# Patient Record
Sex: Female | Born: 1950
Health system: Southern US, Community
[De-identification: ages and names within clinical notes are randomized; demographics above are authoritative.]

## PROBLEM LIST (undated history)

## (undated) DIAGNOSIS — C50919 Malignant neoplasm of unspecified site of unspecified female breast: Principal | ICD-10-CM

## (undated) DIAGNOSIS — C7951 Secondary malignant neoplasm of bone: Principal | ICD-10-CM

## (undated) DIAGNOSIS — M8580 Other specified disorders of bone density and structure, unspecified site: Secondary | ICD-10-CM

## (undated) DIAGNOSIS — Z9221 Personal history of antineoplastic chemotherapy: Secondary | ICD-10-CM

## (undated) DIAGNOSIS — Z8781 Personal history of (healed) traumatic fracture: Secondary | ICD-10-CM

## (undated) DIAGNOSIS — Z9889 Other specified postprocedural states: Secondary | ICD-10-CM

## (undated) HISTORY — DX: Personal history of antineoplastic chemotherapy: Z92.21

## (undated) HISTORY — DX: Secondary malignant neoplasm of bone: C79.51

## (undated) HISTORY — DX: Personal history of (healed) traumatic fracture: Z87.81

## (undated) HISTORY — DX: Other specified disorders of bone density and structure, unspecified site: M85.80

## (undated) HISTORY — DX: Malignant neoplasm of unspecified site of unspecified female breast: C50.919

## (undated) HISTORY — DX: Other specified postprocedural states: Z98.890

---

## 1992-08-19 DIAGNOSIS — Z9889 Other specified postprocedural states: Secondary | ICD-10-CM

## 1992-08-19 HISTORY — DX: Other specified postprocedural states: Z98.890

## 1998-01-01 ENCOUNTER — Other Ambulatory Visit: Admission: RE | Admit: 1998-01-01 | Discharge: 1998-01-01 | Payer: Self-pay | Admitting: *Deleted

## 1998-01-15 ENCOUNTER — Other Ambulatory Visit: Admission: RE | Admit: 1998-01-15 | Discharge: 1998-01-15 | Payer: Self-pay | Admitting: *Deleted

## 1998-08-20 ENCOUNTER — Other Ambulatory Visit: Admission: RE | Admit: 1998-08-20 | Discharge: 1998-08-20 | Payer: Self-pay | Admitting: Radiology

## 1998-08-31 ENCOUNTER — Ambulatory Visit (HOSPITAL_COMMUNITY): Admission: RE | Admit: 1998-08-31 | Discharge: 1998-08-31 | Payer: Self-pay | Admitting: Oncology

## 1998-08-31 ENCOUNTER — Encounter: Payer: Self-pay | Admitting: Oncology

## 1998-09-02 ENCOUNTER — Other Ambulatory Visit: Admission: RE | Admit: 1998-09-02 | Discharge: 1998-09-02 | Payer: Self-pay | Admitting: Radiology

## 1998-09-07 ENCOUNTER — Ambulatory Visit (HOSPITAL_COMMUNITY): Admission: RE | Admit: 1998-09-07 | Discharge: 1998-09-07 | Payer: Self-pay | Admitting: *Deleted

## 1998-10-07 ENCOUNTER — Inpatient Hospital Stay (HOSPITAL_COMMUNITY): Admission: RE | Admit: 1998-10-07 | Discharge: 1998-10-09 | Payer: Self-pay | Admitting: *Deleted

## 1998-10-07 HISTORY — PX: MASTECTOMY: SHX3

## 1998-10-22 ENCOUNTER — Ambulatory Visit (HOSPITAL_COMMUNITY): Admission: RE | Admit: 1998-10-22 | Discharge: 1998-10-22 | Payer: Self-pay | Admitting: Oncology

## 1998-10-22 ENCOUNTER — Encounter: Payer: Self-pay | Admitting: Oncology

## 1998-12-03 ENCOUNTER — Ambulatory Visit (HOSPITAL_COMMUNITY): Admission: RE | Admit: 1998-12-03 | Discharge: 1998-12-03 | Payer: Self-pay | Admitting: Oncology

## 1998-12-03 ENCOUNTER — Encounter: Payer: Self-pay | Admitting: Oncology

## 1999-01-28 ENCOUNTER — Other Ambulatory Visit: Admission: RE | Admit: 1999-01-28 | Discharge: 1999-01-28 | Payer: Self-pay | Admitting: *Deleted

## 1999-05-12 ENCOUNTER — Ambulatory Visit (HOSPITAL_BASED_OUTPATIENT_CLINIC_OR_DEPARTMENT_OTHER): Admission: RE | Admit: 1999-05-12 | Discharge: 1999-05-12 | Payer: Self-pay | Admitting: Plastic Surgery

## 1999-07-16 ENCOUNTER — Ambulatory Visit (HOSPITAL_BASED_OUTPATIENT_CLINIC_OR_DEPARTMENT_OTHER): Admission: RE | Admit: 1999-07-16 | Discharge: 1999-07-16 | Payer: Self-pay | Admitting: Plastic Surgery

## 2000-02-02 ENCOUNTER — Other Ambulatory Visit: Admission: RE | Admit: 2000-02-02 | Discharge: 2000-02-02 | Payer: Self-pay | Admitting: *Deleted

## 2000-08-17 ENCOUNTER — Encounter: Payer: Self-pay | Admitting: Neurosurgery

## 2000-08-17 ENCOUNTER — Ambulatory Visit (HOSPITAL_COMMUNITY): Admission: RE | Admit: 2000-08-17 | Discharge: 2000-08-17 | Payer: Self-pay | Admitting: Neurosurgery

## 2000-08-21 ENCOUNTER — Encounter: Payer: Self-pay | Admitting: Neurological Surgery

## 2000-08-21 ENCOUNTER — Ambulatory Visit (HOSPITAL_COMMUNITY): Admission: RE | Admit: 2000-08-21 | Discharge: 2000-08-22 | Payer: Self-pay | Admitting: Neurological Surgery

## 2001-02-07 ENCOUNTER — Other Ambulatory Visit: Admission: RE | Admit: 2001-02-07 | Discharge: 2001-02-07 | Payer: Self-pay | Admitting: *Deleted

## 2002-02-20 ENCOUNTER — Other Ambulatory Visit: Admission: RE | Admit: 2002-02-20 | Discharge: 2002-02-20 | Payer: Self-pay | Admitting: *Deleted

## 2003-03-06 ENCOUNTER — Other Ambulatory Visit: Admission: RE | Admit: 2003-03-06 | Discharge: 2003-03-06 | Payer: Self-pay | Admitting: *Deleted

## 2004-03-04 ENCOUNTER — Other Ambulatory Visit: Admission: RE | Admit: 2004-03-04 | Discharge: 2004-03-04 | Payer: Self-pay | Admitting: *Deleted

## 2005-01-05 ENCOUNTER — Ambulatory Visit: Payer: Self-pay | Admitting: Oncology

## 2005-03-17 ENCOUNTER — Other Ambulatory Visit: Admission: RE | Admit: 2005-03-17 | Discharge: 2005-03-17 | Payer: Self-pay | Admitting: *Deleted

## 2005-10-05 ENCOUNTER — Ambulatory Visit: Payer: Self-pay | Admitting: Oncology

## 2006-03-02 ENCOUNTER — Other Ambulatory Visit: Admission: RE | Admit: 2006-03-02 | Discharge: 2006-03-02 | Payer: Self-pay | Admitting: *Deleted

## 2006-11-06 ENCOUNTER — Ambulatory Visit: Payer: Self-pay | Admitting: Oncology

## 2007-04-11 ENCOUNTER — Other Ambulatory Visit: Admission: RE | Admit: 2007-04-11 | Discharge: 2007-04-11 | Payer: Self-pay | Admitting: *Deleted

## 2007-11-06 ENCOUNTER — Ambulatory Visit: Payer: Self-pay | Admitting: Oncology

## 2008-01-08 ENCOUNTER — Ambulatory Visit (HOSPITAL_COMMUNITY): Admission: RE | Admit: 2008-01-08 | Discharge: 2008-01-08 | Payer: Self-pay | Admitting: Orthopedic Surgery

## 2008-03-14 ENCOUNTER — Ambulatory Visit (HOSPITAL_COMMUNITY): Admission: RE | Admit: 2008-03-14 | Discharge: 2008-03-14 | Payer: Self-pay | Admitting: Gynecology

## 2008-04-17 ENCOUNTER — Other Ambulatory Visit: Admission: RE | Admit: 2008-04-17 | Discharge: 2008-04-17 | Payer: Self-pay | Admitting: Gynecology

## 2008-11-04 ENCOUNTER — Ambulatory Visit: Payer: Self-pay | Admitting: Oncology

## 2009-11-02 ENCOUNTER — Ambulatory Visit: Payer: Self-pay | Admitting: Oncology

## 2009-11-11 ENCOUNTER — Ambulatory Visit: Payer: Self-pay | Admitting: Genetic Counselor

## 2009-12-28 ENCOUNTER — Ambulatory Visit: Payer: Self-pay | Admitting: Oncology

## 2011-02-01 NOTE — Op Note (Signed)
Stacy Horn, Stacy Horn               ACCOUNT NO.:  [id number]   MEDICAL RECORD NO.:  [id number]          PATIENT TYPE:  AMB   LOCATION:  DAY                          FACILITY:  [facility]   PHYSICIAN:  Marlowe Kays, M.D.  DATE OF BIRTH:  [birth year]   DATE OF PROCEDURE:  01/08/2008  DATE OF DISCHARGE:                               OPERATIVE REPORT   PREOPERATIVE DIAGNOSIS:  Chronic impingement syndrome right shoulder  with rotator cuff tendinopathy.   POSTOPERATIVE DIAGNOSIS:  Chronic impingement syndrome right shoulder  with rotator cuff tendinopathy.   OPERATION:  1. Right shoulder arthroscopy with normal glenohumeral examination.  2. Arthroscopic subacromial decompression.   SURGEON:  Marlowe Kays, M.D.   ASSISTANTDruscilla Brownie. Idolina Primer, P.A.-C.   ANESTHESIA:  General.   INDICATIONS FOR PROCEDURE:  [name] had an MRI performed on November 15, 2007 which demonstrated what appeared to be some fluid in the  subacromial space, a partial tear of the rotator cuff, and a fairly  substantial impingement problem of a type 3 acromion.  She is here,  today, consequently for as a minimum arthroscopic subacromial  decompression with repair of any rotator cuff tear found as necessary.   DESCRIPTION OF PROCEDURE:  Prophylactic antibiotics.  She has a history  of bilateral breast implants, and a staph infection for excision of a  basal cell carcinoma of her right shoulder.  After satisfactory general  anesthesia, placed on the [place] frame.  The right shoulder girdle was  prepped with DuraPrep, and draped in a sterile field.  A time-out  performed.  The anatomy of the shoulder joint was marked out in the  subacromial space, and lateral and posterior portal sites were  infiltrated with 1/2% Marcaine with adrenaline.  Through a posterior  soft spot portal, I atraumatically entered the glenohumeral joint which  was normal in appearance.  Representative pictures were taken.   I then  redirected the scope in the subacromial space, and through a  lateral portal introduced a 4.2 shaver.  The findings in the subacromial  space were significant in that she had almost no bursitis, she did have  a very thick subacromial soft tissue.  The anterior rotator cuff looked  very smooth with no evidence of any tearing.  I then brought in the 90-  degree ArthroCare vaporizer, and began removing soft tissue from the  undersurface of the acromion.  On going back to the [facility] joint the distal  clavicle did not appear to be an impingement factor.   After exposing the subacromial bone, I then brought in the 4 mm oval  bur, and began burring down the acromion.  I worked back-and-forth  between the vaporizer and the bur until we had a wide decompression as  noted.  We inspected the rotator cuff which was smooth.  I took final  pictures demonstrating the release of the impingement,  and then removed all fluid possible from the subacromial space.  I  reinfiltrated the two portals of the subacromial space with 1/2%  Marcaine with adrenaline, and we closed the two portals with 4-0 nylon.  Betadine and Adaptic dry sterile dressing were applied.  She was placed  to shoulder immobilizer.           ______________________________  Marlowe Kays, M.D.     JA/MEDQ  D:  01/08/2008  T:  01/08/2008  Job:  [id number]

## 2011-06-13 ENCOUNTER — Encounter (HOSPITAL_BASED_OUTPATIENT_CLINIC_OR_DEPARTMENT_OTHER): Payer: PRIVATE HEALTH INSURANCE | Admitting: Oncology

## 2011-06-13 DIAGNOSIS — R0789 Other chest pain: Secondary | ICD-10-CM

## 2011-06-13 DIAGNOSIS — C50919 Malignant neoplasm of unspecified site of unspecified female breast: Secondary | ICD-10-CM

## 2011-06-14 LAB — HEMOGLOBIN AND HEMATOCRIT, BLOOD: HCT: 41.2

## 2012-02-10 ENCOUNTER — Telehealth: Payer: Self-pay | Admitting: *Deleted

## 2012-02-10 NOTE — Telephone Encounter (Signed)
Received message from Dr. Anders Grant office reporting pt is being scheduled for biopsy for possible metastases in L suprapubic region. Asking for appt with Dr. Truett Perna pending results. Attempted to call to office for biopsy date. Office was closed. Will follow up next week.

## 2012-02-16 HISTORY — PX: BONE BIOPSY: SHX375

## 2012-02-17 ENCOUNTER — Telehealth: Payer: Self-pay | Admitting: *Deleted

## 2012-02-17 NOTE — Telephone Encounter (Signed)
Attempt to reach [facility] with Dr. Shary Decamp regarding date of biopsy. Office closed for the weekend.

## 2012-02-20 ENCOUNTER — Telehealth: Payer: Self-pay | Admitting: *Deleted

## 2012-02-20 NOTE — Telephone Encounter (Signed)
Was told the biopsy was done on 02/16/12 and path is still pending. Hope to have results in next 48 hours. Per Dr. Marily Memos not make appointment here unless the path report warrants f/u here.

## 2012-02-22 ENCOUNTER — Other Ambulatory Visit: Payer: Self-pay | Admitting: *Deleted

## 2012-02-23 ENCOUNTER — Telehealth: Payer: Self-pay | Admitting: *Deleted

## 2012-02-23 NOTE — Telephone Encounter (Signed)
Requested Her-2 Neu testing on pelvic bone biopsy of 02/16/12 Accession # [id number]. Was told by Dr. Mila Palmer that Her-2 Neu is not validated on bone samples, thus "can't be done".

## 2012-02-27 ENCOUNTER — Other Ambulatory Visit: Payer: PRIVATE HEALTH INSURANCE | Admitting: Lab

## 2012-02-27 ENCOUNTER — Ambulatory Visit (HOSPITAL_BASED_OUTPATIENT_CLINIC_OR_DEPARTMENT_OTHER): Payer: Commercial Managed Care - PPO | Admitting: Oncology

## 2012-02-27 VITALS — BP 100/54 | HR 73 | Temp 98.2°F | Ht 65.5 in | Wt 154.6 lb

## 2012-02-27 DIAGNOSIS — R52 Pain, unspecified: Secondary | ICD-10-CM

## 2012-02-27 DIAGNOSIS — C7951 Secondary malignant neoplasm of bone: Secondary | ICD-10-CM

## 2012-02-27 DIAGNOSIS — C50919 Malignant neoplasm of unspecified site of unspecified female breast: Secondary | ICD-10-CM

## 2012-02-27 DIAGNOSIS — C7952 Secondary malignant neoplasm of bone marrow: Secondary | ICD-10-CM

## 2012-02-27 DIAGNOSIS — Z170 Estrogen receptor positive status [ER+]: Secondary | ICD-10-CM

## 2012-02-27 MED ORDER — OXYCODONE-ACETAMINOPHEN 5-325 MG PO TABS: 1.0000 | ORAL_TABLET | ORAL | Status: DC | PRN

## 2012-02-27 NOTE — Progress Notes (Signed)
[facility]    OFFICE PROGRESS NOTE   INTERVAL HISTORY:   She was last seen at the cancer Center in September of 2012. She presents today for an unscheduled visit. Stacy Horn reports a 5-6 week history of discomfort in the left "groin" region. The discomfort did not improve when she was treated for a urinary tract infection. She saw her orthopedic physician and reports a plain x-ray evaluation of the spine was nondiagnostic.  She returned to Dr. Shary Decamp and a CT of the pelvis on may 21st 2013 was remarkable for a 3.1 x 1.8 x 2.4 cm destructive/lytic lesion in the left pubic symphysis with associated cortical disruption. A cystic lesion in the right ovary was without aggressive imaging features. No suspicious abdominal lymphadenopathy. No ascites.  She was referred for a biopsy of the left pubic lesion in radiology on may 30th 2013. The pathology revealed metastatic carcinoma involving trabecular bone and marrow spaces. A malignant cells were positive for AE1/AE3, cytokeratin 7, and focally with ER. The cells were negative for cytokeratin 20 and PR. The immunostaining pattern and histologic features were felt to be consistent with breast or lung primaries.  She continues to have pain in the left pubic area that is much worse with weightbearing. No other pain.   Review of systems: Pain at the left groin area for the past 5-6 weeks, worse with weightbearing and using the thigh musculature. Chronic numbness in the right leg . Recent episode of urinary frequency improved following treatment with ciprofloxacin  A complete review of systems was otherwise negative Objective:  Vital signs in last 24 hours:  Blood pressure 100/54, pulse 73, temperature 98.2 F (36.8 C), height 5' 5.5" (1.664 m), weight 154 lb 9.6 oz (70.126 kg).    HEENT: Neck without mass Lymphatics: No cervical, supraclavicular, axillary, or inguinal nodes Resp: Lungs clear bilaterally Cardio: Regular rate  and rhythm GI: No hepatomegaly, no mass, nontender Vascular: No leg edema  Musculoskeletal: There is tenderness at the left pubic bone. There is a less than 1 cm seroma/hematoma beneath the left pubic bone biopsy site. No tenderness at the spine. Full range of motion at the left hip without pain. Breast: Status post bilateral mastectomy. No evidence for chest wall tumor recurrence.   Lab Results:  Jan 26 2012 at [facility]-calcium 9.4, creatinine 0.7, alkaline phosphatase 71, albumin 4.7, bilirubin 0.6, AST 17, ALT 16   Medications: I have reviewed the patient's current medications.  Assessment/Plan: 1. Multicentric invasive lobular carcinoma of the right breast diagnosed in December of 1999, a right mastectomy followed by adjuvant [facility] chemotherapy, 5 years of tamoxifen, and 5 years of Femara. The Femara was completed in June of 2010.  2. Pain at the left groin area with a CT of the pelvis on 02/07/12 confirming a destructive lytic lesion at the left pubic symphysis, status post a CT-guided biopsy on 02/16/12 confirming metastatic carcinoma, focally ER positive  3. BRCA1 variant felt to be a polymorphism   Disposition:  She has been diagnosed with metastatic carcinoma involving a left pubis lesion. She appears to have metastatic breast cancer. I discussed the diagnosis, prognosis, and treatment options with Stacy Horn and her husband. She understands patients with metastatic breast cancer involving the "bones "only can live for an extended period of time.  She will complete a staging evaluation prior to making a decision on systemic therapy.  Stacy Horn is symptomatic with pain at the left pubis. We will make  a referral to radiation oncology for palliative radiation. She will try Percocet for the pain.  We will obtain the original ER/PR/HER-2/neu report and we will ask for a HER-2 evaluation on the pubis biopsy.  My initial impression is to recommend systemic hormonal therapy  at the completion of radiation.  Stacy Horn will return for an office visit after the staging PET scan. We will obtain a baseline CA 27.29 and CBC.    Approximately 50 minutes were spent with the patient and her husband today, the majority of which were involved with counseling and coordination of care. I called for on 02/22/2012 to discuss the probable diagnosis of recurrent breast cancer.  Thornton Papas, MD  02/27/2012  5:39 PM

## 2012-02-29 ENCOUNTER — Telehealth: Payer: Self-pay | Admitting: *Deleted

## 2012-02-29 NOTE — Telephone Encounter (Signed)
Message from pt asking to call her at work with appts. [phone] Extension [phone]

## 2012-02-29 NOTE — Telephone Encounter (Signed)
Spoke with pt, MD appt given. She wants to be sure radiation is set up in [place]. Informed her that schedulers will call with PET and radiation appts.

## 2012-03-01 ENCOUNTER — Telehealth: Payer: Self-pay | Admitting: Oncology

## 2012-03-01 NOTE — Telephone Encounter (Signed)
called pts and she is aware of pet scan on 06/19 @ [facility]. aslo informed pt that her information was faxed to [facility] center and they will call her with appt

## 2012-03-06 ENCOUNTER — Other Ambulatory Visit (HOSPITAL_BASED_OUTPATIENT_CLINIC_OR_DEPARTMENT_OTHER): Payer: Commercial Managed Care - PPO | Admitting: Lab

## 2012-03-06 ENCOUNTER — Telehealth: Payer: Self-pay

## 2012-03-06 DIAGNOSIS — C50319 Malignant neoplasm of lower-inner quadrant of unspecified female breast: Secondary | ICD-10-CM

## 2012-03-06 DIAGNOSIS — R52 Pain, unspecified: Secondary | ICD-10-CM

## 2012-03-06 DIAGNOSIS — C50919 Malignant neoplasm of unspecified site of unspecified female breast: Secondary | ICD-10-CM

## 2012-03-06 LAB — CBC WITH DIFFERENTIAL/PLATELET
Basophils Absolute: 0 10*3/uL (ref 0.0–0.1)
Eosinophils Absolute: 0.1 10*3/uL (ref 0.0–0.5)
HCT: 39.2 % (ref 34.8–46.6)
HGB: 13.2 g/dL (ref 11.6–15.9)
LYMPH%: 27.7 % (ref 14.0–49.7)
MCHC: 33.7 g/dL (ref 31.5–36.0)
MONO#: 0.3 10*3/uL (ref 0.1–0.9)
NEUT#: 3.5 10*3/uL (ref 1.5–6.5)
NEUT%: 65.6 % (ref 38.4–76.8)
Platelets: 209 10*3/uL (ref 145–400)
WBC: 5.3 10*3/uL (ref 3.9–10.3)
lymph#: 1.5 10*3/uL (ref 0.9–3.3)

## 2012-03-06 NOTE — Telephone Encounter (Signed)
Received call from pt stating that Dr. Truett Perna informed her that she would go ahead and get started with her radiation at [facility], but she has not heard anything.  She states her sister, who works there, called to check on this appointment, and was informed that they were waiting for PET scan results and CT scan and biopsy results first.  Pt was under the impression that her radiation could be started to help her pain.  Informed pt will check with Dr. Truett Perna, and office will call her back.  Pt verbalizes understanding.

## 2012-03-07 ENCOUNTER — Telehealth: Payer: Self-pay | Admitting: Oncology

## 2012-03-07 ENCOUNTER — Telehealth: Payer: Self-pay

## 2012-03-07 ENCOUNTER — Encounter (HOSPITAL_COMMUNITY)
Admission: RE | Admit: 2012-03-07 | Discharge: 2012-03-07 | Disposition: A | Discharge status: Home / Self Care | Payer: Commercial Managed Care - PPO | Source: Ambulatory Visit | Attending: Oncology | Admitting: Oncology

## 2012-03-07 DIAGNOSIS — C7951 Secondary malignant neoplasm of bone: Secondary | ICD-10-CM | POA: Insufficient documentation

## 2012-03-07 DIAGNOSIS — Z9010 Acquired absence of unspecified breast and nipple: Secondary | ICD-10-CM | POA: Insufficient documentation

## 2012-03-07 DIAGNOSIS — C50919 Malignant neoplasm of unspecified site of unspecified female breast: Secondary | ICD-10-CM | POA: Insufficient documentation

## 2012-03-07 MED ORDER — FLUDEOXYGLUCOSE F - 18 (FDG) INJECTION
15.2000 | Freq: Once | INTRAVENOUS | Status: AC | PRN
  Administered 2012-03-07: 15.2 via INTRAVENOUS

## 2012-03-07 NOTE — Telephone Encounter (Signed)
called pt Stacy Horn for appt with Dr. Karoline Caldwell asked pt to rtn call to confirm appt

## 2012-03-07 NOTE — Telephone Encounter (Signed)
Left a message for Stacy Horn that an appt. was able to be set up tomorrow  At [facility] with Dr. Clovis Riley.  She needs to arrive at 0845. Faxed path and cytology reports form 1999 to 2000 to [facility]. [phone].

## 2012-03-09 ENCOUNTER — Ambulatory Visit (HOSPITAL_BASED_OUTPATIENT_CLINIC_OR_DEPARTMENT_OTHER): Payer: Commercial Managed Care - PPO | Admitting: Oncology

## 2012-03-09 ENCOUNTER — Other Ambulatory Visit: Payer: Self-pay | Admitting: Oncology

## 2012-03-09 ENCOUNTER — Telehealth: Payer: Self-pay | Admitting: Oncology

## 2012-03-09 VITALS — BP 108/66 | HR 64 | Temp 97.5°F | Ht 65.5 in | Wt 152.7 lb

## 2012-03-09 DIAGNOSIS — C50919 Malignant neoplasm of unspecified site of unspecified female breast: Secondary | ICD-10-CM

## 2012-03-09 DIAGNOSIS — G893 Neoplasm related pain (acute) (chronic): Secondary | ICD-10-CM

## 2012-03-09 DIAGNOSIS — C7951 Secondary malignant neoplasm of bone: Secondary | ICD-10-CM

## 2012-03-09 LAB — LACTATE DEHYDROGENASE: LDH: 97 U/L (ref 94–250)

## 2012-03-09 LAB — CEA: CEA: <0.5 ng/mL (ref 0.0–5.0)

## 2012-03-09 NOTE — Telephone Encounter (Signed)
appts made and printed for pt aom °

## 2012-03-09 NOTE — Progress Notes (Signed)
   [facility]    OFFICE PROGRESS NOTE   INTERVAL HISTORY:   She saw Dr. Clovis Riley and he is scheduled to begin radiation on 03/12/2012. She continues to have pain at the left pubic area. The pain is relieved with Percocet. She also has pain at the left iliac with weightbearing. No pain in the upper back. No other complaint.  Objective:  Vital signs in last 24 hours:  Blood pressure 108/66, pulse 64, temperature 97.5 F (36.4 C), temperature source Oral, height 5' 5.5" (1.664 m), weight 152 lb 11.2 oz (69.264 kg).    Musculoskeletal: No tenderness at the left iliac or thoracic spine.  X-rays: PET scan  03/07/2012-multifocal hypermetabolic lytic bone metastases including lesions at the transverse process of T3, the left iliac bone, and the left pubic symphysis. No other evidence of metastatic disease.  Lab Results:  Lab Results  Component Value Date   WBC 5.3 03/06/2012   HGB 13.2 03/06/2012   HCT 39.2 03/06/2012   MCV 88.4 03/06/2012   PLT 209 03/06/2012   CA 27.29  17 on 03/06/2012   Medications: I have reviewed the patient's current medications.  Assessment/Plan: 1.Multicentric invasive lobular carcinoma of the right breast diagnosed in December of 1999, a right mastectomy followed by adjuvant [facility] chemotherapy, 5 years of tamoxifen, and 5 years of Femara. The Femara was completed in June of 2010. The right breast mass excision in January 2000 was ER positive, PR positive, and HER-2 negative  2. Pain at the left groin area with a CT of the pelvis on 02/07/12 confirming a destructive lytic lesion at the left pubic symphysis, status post a CT-guided biopsy on 02/16/12 confirming metastatic carcinoma, focally ER positive .         -PET scan 03/07/2012 confirmed multiple hypermetabolic bone metastases with no other evidence of metastatic disease  3. BRCA1 variant felt to be a polymorphism   Disposition:  She has been diagnosed with metastatic breast cancer involving  the bones. Ms. Catalina is symptomatic with pain related to a destructive pubic bone metastasis. She is scheduled to complete palliative radiation beginning on 03/12/2012.  There is no evidence of visceral metastatic disease. She appears to have "bone only "metastatic breast cancer. I discussed treatment options with the patient and her husband. I recommend treatment with hormonal therapy. She was treated with 5 years of Femara and begin June of 2010. The plan is to resume aromatase inhibitor therapy with Arimidex. We reviewed the potential toxicities associated with the Arimidex including the chance of hot flashes, arthralgias, and decreased bone density.  There is x-ray evidence of destructive bone lesions. I recommend osteoclast inhibitor therapy. She will begin treatment with denosumab. We reviewed potential toxicities associated with this agent including a chance for hypocalcemia, hypophosphatemia, renal insufficiency, and osteonecrosis.  Ms. Tenorio will begin arimidex on 03/27/2012. She will return for a first treatment with denosumab on 04/03/2012. She is scheduled for an office visit on 05/01/2012.  The only site of metastatic disease is the bones and the CA 27.29 is normal. We will check an LDH and CEA to look for "measurable "disease. We will obtain a restaging bone scan in approximately 6 months.   Thornton Papas, MD  03/09/2012  2:47 PM

## 2012-03-13 ENCOUNTER — Encounter: Payer: Self-pay | Admitting: Radiation Oncology

## 2012-03-14 ENCOUNTER — Ambulatory Visit: Payer: Commercial Managed Care - PPO

## 2012-03-14 ENCOUNTER — Ambulatory Visit
Admission: RE | Admit: 2012-03-14 | Payer: Commercial Managed Care - PPO | Source: Ambulatory Visit | Admitting: Radiation Oncology

## 2012-03-15 ENCOUNTER — Other Ambulatory Visit: Payer: Self-pay | Admitting: *Deleted

## 2012-03-15 ENCOUNTER — Telehealth: Payer: Self-pay | Admitting: Oncology

## 2012-03-15 DIAGNOSIS — C50919 Malignant neoplasm of unspecified site of unspecified female breast: Secondary | ICD-10-CM

## 2012-03-15 DIAGNOSIS — R52 Pain, unspecified: Secondary | ICD-10-CM

## 2012-03-15 MED ORDER — OXYCODONE-ACETAMINOPHEN 5-325 MG PO TABS: 1.0000 | ORAL_TABLET | ORAL | Status: DC | PRN

## 2012-03-15 NOTE — Telephone Encounter (Signed)
Moved 8/13 appt to AM due to call day. lmonvm for pt re new time and mailed schedule.

## 2012-03-21 ENCOUNTER — Encounter: Payer: Self-pay | Admitting: Dietician

## 2012-03-21 NOTE — Progress Notes (Signed)
Brief Out-patient Oncology Nutrition Note  Reason: Patient screened positive for nutrition risk for unintentional weight loss and decreased appetite.   Stacy Horn is a 61 year old patient of Dr. Truett Perna, diagnosed with malignant neoplasm of breast. Contacted patient via telephone number for nutrition risk. Patient reported a good appetite and intake. She is without any nutrition related questions or concerns at this time.   RD available for nutrition needs.   [name] [facility] [phone]

## 2012-03-26 ENCOUNTER — Other Ambulatory Visit: Payer: Self-pay | Admitting: *Deleted

## 2012-03-26 DIAGNOSIS — C50919 Malignant neoplasm of unspecified site of unspecified female breast: Secondary | ICD-10-CM

## 2012-03-26 MED ORDER — ANASTROZOLE 1 MG PO TABS: 1.0000 mg | ORAL_TABLET | Freq: Every day | ORAL | Status: DC

## 2012-03-26 NOTE — Telephone Encounter (Signed)
Completed her radiation therapy today and asking when to begin her systemic therapy of arimidex? May begin 03/27/12 per Dr. Truett Perna.  Patient notified and also instructed to be sure she takes Calcium and vitamin D daily.

## 2012-04-04 ENCOUNTER — Telehealth: Payer: Self-pay | Admitting: *Deleted

## 2012-04-04 ENCOUNTER — Other Ambulatory Visit: Payer: Self-pay | Admitting: *Deleted

## 2012-04-04 ENCOUNTER — Encounter: Payer: Self-pay | Admitting: Nurse Practitioner

## 2012-04-04 ENCOUNTER — Other Ambulatory Visit: Payer: Self-pay | Admitting: Nurse Practitioner

## 2012-04-04 ENCOUNTER — Ambulatory Visit (HOSPITAL_BASED_OUTPATIENT_CLINIC_OR_DEPARTMENT_OTHER): Payer: Commercial Managed Care - PPO

## 2012-04-04 VITALS — BP 109/65 | HR 60 | Temp 97.1°F

## 2012-04-04 DIAGNOSIS — C7951 Secondary malignant neoplasm of bone: Secondary | ICD-10-CM

## 2012-04-04 DIAGNOSIS — C50919 Malignant neoplasm of unspecified site of unspecified female breast: Secondary | ICD-10-CM

## 2012-04-04 HISTORY — DX: Malignant neoplasm of unspecified site of unspecified female breast: C50.919

## 2012-04-04 LAB — COMPREHENSIVE METABOLIC PANEL
ALT: 20 U/L (ref 0–35)
AST: 22 U/L (ref 0–37)
CO2: 28 mEq/L (ref 19–32)
Calcium: 9.7 mg/dL (ref 8.4–10.5)
Chloride: 100 mEq/L (ref 96–112)
Creatinine, Ser: 0.64 mg/dL (ref 0.50–1.10)
Potassium: 3.8 mEq/L (ref 3.5–5.3)
Sodium: 136 mEq/L (ref 135–145)
Total Protein: 7.1 g/dL (ref 6.0–8.3)

## 2012-04-04 MED ORDER — DENOSUMAB 120 MG/1.7ML ~~LOC~~ SOLN
120.0000 mg | Freq: Once | SUBCUTANEOUS | Status: AC
  Administered 2012-04-04: 120 mg via SUBCUTANEOUS
  Filled 2012-04-04: qty 1.7

## 2012-04-04 NOTE — Progress Notes (Signed)
Pt given written information on XGEVA injection. Teaching reviewed to include risk for osteonecrosis of the jaw. Pt voiced understanding and was able to verbalize correct action to take in case of jaw pain. She understands to make her dentist aware that she is being treated with this medication. Consent form signed.

## 2012-04-04 NOTE — Telephone Encounter (Signed)
Called pt, will need lab before Xgeva injection today, she will attempt to come earlier. Unable to reschedule for injection at this time so she will wait for results, to see if she can have injection today. Pt is aware that we are awaiting prior authorization from her insurance company.

## 2012-04-10 ENCOUNTER — Encounter: Payer: Self-pay | Admitting: Oncology

## 2012-04-10 NOTE — Progress Notes (Signed)
Signed patient up for xgeva co pay assistance program with [employer].

## 2012-05-01 ENCOUNTER — Telehealth: Payer: Self-pay | Admitting: Oncology

## 2012-05-01 ENCOUNTER — Other Ambulatory Visit: Payer: Commercial Managed Care - PPO

## 2012-05-01 ENCOUNTER — Other Ambulatory Visit: Payer: Self-pay | Admitting: *Deleted

## 2012-05-01 ENCOUNTER — Other Ambulatory Visit (HOSPITAL_BASED_OUTPATIENT_CLINIC_OR_DEPARTMENT_OTHER): Payer: Commercial Managed Care - PPO | Admitting: Lab

## 2012-05-01 ENCOUNTER — Ambulatory Visit (HOSPITAL_BASED_OUTPATIENT_CLINIC_OR_DEPARTMENT_OTHER): Payer: Commercial Managed Care - PPO | Admitting: Oncology

## 2012-05-01 ENCOUNTER — Ambulatory Visit (HOSPITAL_COMMUNITY)
Admission: RE | Admit: 2012-05-01 | Discharge: 2012-05-01 | Disposition: A | Discharge status: Home / Self Care | Payer: Commercial Managed Care - PPO | Source: Ambulatory Visit | Attending: Oncology | Admitting: Oncology

## 2012-05-01 ENCOUNTER — Ambulatory Visit: Payer: Commercial Managed Care - PPO | Admitting: Oncology

## 2012-05-01 ENCOUNTER — Other Ambulatory Visit: Payer: Self-pay | Admitting: Oncology

## 2012-05-01 VITALS — BP 121/73 | HR 60 | Temp 97.1°F | Resp 20 | Ht 65.5 in | Wt 155.3 lb

## 2012-05-01 DIAGNOSIS — C7951 Secondary malignant neoplasm of bone: Secondary | ICD-10-CM

## 2012-05-01 DIAGNOSIS — R079 Chest pain, unspecified: Secondary | ICD-10-CM | POA: Insufficient documentation

## 2012-05-01 DIAGNOSIS — R52 Pain, unspecified: Secondary | ICD-10-CM

## 2012-05-01 DIAGNOSIS — C50919 Malignant neoplasm of unspecified site of unspecified female breast: Secondary | ICD-10-CM

## 2012-05-01 DIAGNOSIS — Z170 Estrogen receptor positive status [ER+]: Secondary | ICD-10-CM

## 2012-05-01 DIAGNOSIS — C7952 Secondary malignant neoplasm of bone marrow: Secondary | ICD-10-CM

## 2012-05-01 LAB — COMPREHENSIVE METABOLIC PANEL
AST: 20 U/L (ref 0–37)
Albumin: 4.2 g/dL (ref 3.5–5.2)
Alkaline Phosphatase: 65 U/L (ref 39–117)
BUN: 21 mg/dL (ref 6–23)
Calcium: 9.4 mg/dL (ref 8.4–10.5)
Creatinine, Ser: 0.68 mg/dL (ref 0.50–1.10)
Glucose, Bld: 84 mg/dL (ref 70–99)
Potassium: 4.1 mEq/L (ref 3.5–5.3)

## 2012-05-01 LAB — PHOSPHORUS: Phosphorus: 2.8 mg/dL (ref 2.3–4.6)

## 2012-05-01 MED ORDER — OXYCODONE-ACETAMINOPHEN 5-325 MG PO TABS: 1.0000 | ORAL_TABLET | ORAL | Status: DC | PRN

## 2012-05-01 NOTE — Telephone Encounter (Signed)
appts made and printed for pt ,pt req to go ahead with 9/12 and will call with any conflicts,pt also sent to xray

## 2012-05-01 NOTE — Progress Notes (Signed)
   [facility]    OFFICE PROGRESS NOTE   INTERVAL HISTORY:   She returns as scheduled. She completed radiation to the pelvic lesions and reports resolution of the pain in this area. She complains of pain at the left low anterior "ribs ". There is tenderness over the ribs and the pain wakes her up at night. She is taking Percocet for the pain. No other complaint.  She was treated with a first dose  of denosumab on 04/04/2012. She reports no apparent side effects from this treatment.  She is taking Arimidex. No hot flashes.  Objective:  Vital signs in last 24 hours:  Blood pressure 121/73, pulse 60, temperature 97.1 F (36.2 C), resp. rate 20, height 5' 5.5" (1.664 m), weight 155 lb 4.8 oz (70.444 kg).    Lymphatics: No left axillary nodes Resp: Lungs clear bilaterally Cardio: Regular rate and rhythm GI: No hepatomegaly, nontender Vascular: No leg edema  Musculoskeletal: There is tenderness over the left low anterior ribs extending toward the left axilla. No rash or mass.    Medications: I have reviewed the patient's current medications.  Assessment/Plan: 1.Multicentric invasive lobular carcinoma of the right breast diagnosed in December of 1999, a right mastectomy followed by adjuvant [facility] chemotherapy, 5 years of tamoxifen, and 5 years of Femara. The Femara was completed in June of 2010. The right breast mass excision in January 2000 was ER positive, PR positive, and HER-2 negative   2. Pain at the left groin area with a CT of the pelvis on 02/07/12 confirming a destructive lytic lesion at the left pubic symphysis, status post a CT-guided biopsy on 02/16/12 confirming metastatic carcinoma, focally ER positive .  -PET scan 03/07/2012 confirmed multiple hypermetabolic bone metastases with no other evidence of metastatic disease -Status post palliative radiation to the pelvis beginning on 03/12/2012 -Initiation of Arimidex on 03/27/2012  3. BRCA1 variant felt to be a  polymorphism  4. Pain/tenderness at the left low anterolateral chest wall-? Secondary to metastatic disease involving the ribs, no metastatic lesion was reported in this area on the staging PET scan 04/06/2012.    Disposition:  She will continue Arimidex for the metastatic breast cancer. She has developed pain and tenderness at the left low anterior ribs. The pain could be related to a metastasis in the ribs. We will obtain a plain x-ray evaluation today. She will continue Percocet as needed for pain. I will review the 03/07/2012 PET scan images with a radiologist.  Ms. Dipalma will return for an office visit in one month. The plan is to continue xgeva every 3 months.   Thornton Papas, MD  05/01/2012  11:18 AM

## 2012-05-02 ENCOUNTER — Encounter: Payer: Self-pay | Admitting: *Deleted

## 2012-05-02 NOTE — Progress Notes (Signed)
Faxed office note and CXR report to Dr. Clovis Riley in [place] with note per Dr. Truett Perna to please treat area of pain/tenderness at left ribs, PET +. Confirmed with radiology dept. That MD in [place] can see films from [facility] .

## 2012-05-10 ENCOUNTER — Encounter: Payer: Self-pay | Admitting: Oncology

## 2012-05-10 NOTE — Progress Notes (Signed)
Faxed cancer claim for to [employer] @ [phone].

## 2012-05-31 ENCOUNTER — Telehealth: Payer: Self-pay | Admitting: Oncology

## 2012-05-31 ENCOUNTER — Ambulatory Visit (HOSPITAL_BASED_OUTPATIENT_CLINIC_OR_DEPARTMENT_OTHER): Payer: Commercial Managed Care - PPO | Admitting: Oncology

## 2012-05-31 VITALS — BP 118/78 | HR 69 | Temp 97.0°F | Resp 20 | Ht 65.5 in | Wt 156.5 lb

## 2012-05-31 DIAGNOSIS — C50919 Malignant neoplasm of unspecified site of unspecified female breast: Secondary | ICD-10-CM

## 2012-05-31 DIAGNOSIS — C7951 Secondary malignant neoplasm of bone: Secondary | ICD-10-CM

## 2012-05-31 NOTE — Telephone Encounter (Signed)
gve the pt her oct,nov 2013 appt calendar. °

## 2012-05-31 NOTE — Patient Instructions (Signed)
[facility] Discharge Instructions  Your exam findings, labs and results were discussed with your MD today.   Please visit scheduling to obtain calendar for future appointments.  Please call the [facility] at [phone] during business hours should you have any further questions or need assistance in obtaining follow-up care. If you have a medical emergency, please dial 911.  Special Instructions:         

## 2012-05-31 NOTE — Progress Notes (Signed)
   [facility]    OFFICE PROGRESS NOTE   INTERVAL HISTORY:   She returns as scheduled. She completed radiation to the left chest wall and spine on 05/28/2012. The left chest wall pain has essentially resolved, she continues to have mild pain at the mid back. No pain in the pelvis. Mild discomfort at the left ischium when sitting. She has odynophagia and a skin rash has developed over the past few days.  Objective:  Vital signs in last 24 hours:  Blood pressure 118/78, pulse 69, temperature 97 F (36.1 C), temperature source Oral, resp. rate 20, height 5' 5.5" (1.664 m), weight 156 lb 8 oz (70.988 kg).    HEENT: No thrush or ulcers Resp: Lungs clear bilaterally Cardio: Regular rate and rhythm GI: No hepatomegaly Vascular: No leg edema  Skin: Erythematous slightly raised rash at the upper chest and upper back in 8 radiation distribution     Medications: I have reviewed the patient's current medications.  Assessment/Plan: 1.Multicentric invasive lobular carcinoma of the right breast diagnosed in December of 1999, a right mastectomy followed by adjuvant [facility] chemotherapy, 5 years of tamoxifen, and 5 years of Femara. The Femara was completed in June of 2010. The right breast mass excision in January 2000 was ER positive, PR positive, and HER-2 negative  2. Pain at the left groin area with a CT of the pelvis on 02/07/12 confirming a destructive lytic lesion at the left pubic symphysis, status post a CT-guided biopsy on 02/16/12 confirming metastatic carcinoma, focally ER positive .  -PET scan 03/07/2012 confirmed multiple hypermetabolic bone metastases with no other evidence of metastatic disease  -Status post palliative radiation to the pelvis beginning on 03/12/2012  -Initiation of Arimidex on 03/27/2012                        -initiation of every three-month xgeva on 04/04/2012 3. BRCA1 variant felt to be a polymorphism  4. Pain/tenderness at the left low anterolateral  chest wall-she completed palliative radiation on 05/28/2012 with improvement in the pain. She was also treated with radiation to the thoracic spine. 5. Odynophagia and chest rash secondary to radiation Disposition:  She has completed palliative radiation to the left rib metastasis with clinical improvement. She continues Arimidex. Ms. Seltzer will return for another treatment with xgeva on 07/05/2012. She will return for an office visit in 2 months.  The plan is to obtain a restaging PET scan at a six-month interval.   Thornton Papas, MD  05/31/2012  4:32 PM

## 2012-06-05 ENCOUNTER — Ambulatory Visit: Payer: Commercial Managed Care - PPO

## 2012-06-15 ENCOUNTER — Telehealth: Payer: Self-pay | Admitting: *Deleted

## 2012-06-15 DIAGNOSIS — C7951 Secondary malignant neoplasm of bone: Secondary | ICD-10-CM

## 2012-06-15 MED ORDER — HYDROCODONE-ACETAMINOPHEN 5-500 MG PO CAPS: 1.0000 | ORAL_CAPSULE | Freq: Four times a day (QID) | ORAL | Status: DC | PRN

## 2012-06-15 NOTE — Telephone Encounter (Signed)
Per Dr. Truett Perna, OK for Vicodin prn and flu vaccine. Patient notified. Had to call [facility] to alternate [facility] due to difficulty at usual pharmacy

## 2012-06-15 NOTE — Telephone Encounter (Signed)
Asking for less strong pain med that can be called in for her persistent pain left hip area. Also asking if OK for flu vaccine at work?

## 2012-07-05 ENCOUNTER — Ambulatory Visit (HOSPITAL_BASED_OUTPATIENT_CLINIC_OR_DEPARTMENT_OTHER): Payer: Commercial Managed Care - PPO

## 2012-07-05 ENCOUNTER — Other Ambulatory Visit: Payer: Self-pay | Admitting: Nurse Practitioner

## 2012-07-05 ENCOUNTER — Other Ambulatory Visit (HOSPITAL_BASED_OUTPATIENT_CLINIC_OR_DEPARTMENT_OTHER): Payer: Commercial Managed Care - PPO

## 2012-07-05 VITALS — BP 118/72 | HR 64 | Temp 97.6°F

## 2012-07-05 DIAGNOSIS — C7951 Secondary malignant neoplasm of bone: Secondary | ICD-10-CM

## 2012-07-05 DIAGNOSIS — C50919 Malignant neoplasm of unspecified site of unspecified female breast: Secondary | ICD-10-CM

## 2012-07-05 LAB — COMPREHENSIVE METABOLIC PANEL (CC13)
ALT: 21 U/L (ref 0–55)
AST: 23 U/L (ref 5–34)
Alkaline Phosphatase: 57 U/L (ref 40–150)
BUN: 19 mg/dL (ref 7.0–26.0)
Calcium: 10.2 mg/dL (ref 8.4–10.4)
Creatinine: 0.8 mg/dL (ref 0.6–1.1)
Total Bilirubin: 0.8 mg/dL (ref 0.20–1.20)

## 2012-07-05 MED ORDER — DENOSUMAB 120 MG/1.7ML ~~LOC~~ SOLN
120.0000 mg | Freq: Once | SUBCUTANEOUS | Status: AC
  Administered 2012-07-05: 120 mg via SUBCUTANEOUS
  Filled 2012-07-05: qty 1.7

## 2012-08-01 ENCOUNTER — Other Ambulatory Visit: Payer: Self-pay | Admitting: Oncology

## 2012-08-02 ENCOUNTER — Other Ambulatory Visit: Payer: Self-pay | Admitting: *Deleted

## 2012-08-02 ENCOUNTER — Telehealth: Payer: Self-pay | Admitting: Oncology

## 2012-08-02 ENCOUNTER — Ambulatory Visit (HOSPITAL_BASED_OUTPATIENT_CLINIC_OR_DEPARTMENT_OTHER): Payer: Commercial Managed Care - PPO | Admitting: Oncology

## 2012-08-02 VITALS — BP 109/68 | HR 78 | Temp 99.4°F | Resp 20 | Ht 65.5 in | Wt 159.6 lb

## 2012-08-02 DIAGNOSIS — C50919 Malignant neoplasm of unspecified site of unspecified female breast: Secondary | ICD-10-CM

## 2012-08-02 DIAGNOSIS — C7951 Secondary malignant neoplasm of bone: Secondary | ICD-10-CM

## 2012-08-02 DIAGNOSIS — Z170 Estrogen receptor positive status [ER+]: Secondary | ICD-10-CM

## 2012-08-02 DIAGNOSIS — Z1501 Genetic susceptibility to malignant neoplasm of breast: Secondary | ICD-10-CM

## 2012-08-02 MED ORDER — HYDROCODONE-ACETAMINOPHEN 5-500 MG PO CAPS: 1.0000 | ORAL_CAPSULE | Freq: Four times a day (QID) | ORAL | Status: DC | PRN

## 2012-08-02 NOTE — Progress Notes (Signed)
   [facility]    OFFICE PROGRESS NOTE   INTERVAL HISTORY:   She returns as scheduled. She was treated with xgeva 07/05/2012. She tolerated the therapy well. She continues daily Arimidex. No hot flashes or arthralgias. The left chest wall discomfort has resolved. She has intermittent upper back discomfort when sitting or standing for a prolonged period.  Objective:  Vital signs in last 24 hours:  Blood pressure 109/68, pulse 78, temperature 99.4 F (37.4 C), temperature source Oral, resp. rate 20, height 5' 5.5" (1.664 m), weight 159 lb 9.6 oz (72.394 kg).    HEENT: Neck without mass Lymphatics: No cervical or supraclavicular nodes. 1/2 cm mobile left axillary node,? 1 cm mobile right axillary node. Resp: Lungs clear bilaterally Cardio: Regular rate and rhythm GI: No hepatomegaly Vascular: No leg edema Breast: Status post bilateral mastectomy-no evidence for chest wall tumor recurrence  Musculoskeletal: No spine tenderness    Medications: I have reviewed the patient's current medications.  Assessment/Plan: 1.Multicentric invasive lobular carcinoma of the right breast diagnosed in December of 1999, a right mastectomy followed by adjuvant [facility] chemotherapy, 5 years of tamoxifen, and 5 years of Femara. The Femara was completed in June of 2010. The right breast mass excision in January 2000 was ER positive, PR positive, and HER-2 negative  2. Pain at the left groin area with a CT of the pelvis on 02/07/12 confirming a destructive lytic lesion at the left pubic symphysis, status post a CT-guided biopsy on 02/16/12 confirming metastatic carcinoma, focally ER positive .  -PET scan 03/07/2012 confirmed multiple hypermetabolic bone metastases with no other evidence of metastatic disease  -Status post palliative radiation to the pelvis beginning on 03/12/2012  -Initiation of Arimidex on 03/27/2012 -initiation of every three-month xgeva on 04/04/2012  3. BRCA1 variant felt to be  a polymorphism  4. Pain/tenderness at the left low anterolateral chest wall-she completed palliative radiation on 05/28/2012 with improvement in the pain. She was also treated with radiation to the thoracic spine.   Disposition:  She appears stable. The plan is to continue Arimidex. She will be scheduled for a six-month restaging PET scan on 09/14/2012. She will return for an office visit and xgeva on 09/28/2011.   Thornton Papas, MD  08/02/2012  4:02 PM

## 2012-08-02 NOTE — Telephone Encounter (Signed)
Gave pt appt for January 2014 MD visit with injections, pt informed that Radiology will call regarding PET scan

## 2012-09-05 ENCOUNTER — Encounter: Payer: Self-pay | Admitting: Oncology

## 2012-09-05 NOTE — Progress Notes (Unsigned)
09/05/2012 I left a voice message on this patient's answering machine, RE:  Pet Scan approved. 09/04/2012  Bonita Quin #[id number]

## 2012-09-05 NOTE — Progress Notes (Unsigned)
09/05/2012  I spoke with this patient to inform her that [facility] had finally approved her Pet Scan, 09/14/2012.  Bonita Quin [phone]

## 2012-09-14 ENCOUNTER — Encounter (HOSPITAL_COMMUNITY)
Admission: RE | Admit: 2012-09-14 | Discharge: 2012-09-14 | Disposition: A | Discharge status: Home / Self Care | Payer: Commercial Managed Care - PPO | Source: Ambulatory Visit | Attending: Oncology | Admitting: Oncology

## 2012-09-14 DIAGNOSIS — C50919 Malignant neoplasm of unspecified site of unspecified female breast: Secondary | ICD-10-CM

## 2012-09-14 DIAGNOSIS — C7951 Secondary malignant neoplasm of bone: Secondary | ICD-10-CM | POA: Insufficient documentation

## 2012-09-14 LAB — GLUCOSE, CAPILLARY: Glucose-Capillary: 94 mg/dL (ref 70–99)

## 2012-09-14 MED ORDER — FLUDEOXYGLUCOSE F - 18 (FDG) INJECTION
16.6000 | Freq: Once | INTRAVENOUS | Status: AC | PRN
  Administered 2012-09-14: 16.6 via INTRAVENOUS

## 2012-09-17 ENCOUNTER — Encounter: Payer: Self-pay | Admitting: Oncology

## 2012-09-26 ENCOUNTER — Encounter: Payer: Self-pay | Admitting: Oncology

## 2012-09-26 ENCOUNTER — Telehealth: Payer: Self-pay | Admitting: *Deleted

## 2012-09-26 NOTE — Telephone Encounter (Signed)
Call from pt's husband requesting email address to send a link to something he found on-line re: EFR1 mutations found in breast cancer pt's who have been treated for long periods of time with aromatase inhibitors. Dr. Truett Perna made aware. Pt will send info via Mychart message.

## 2012-09-27 ENCOUNTER — Telehealth: Payer: Self-pay | Admitting: Oncology

## 2012-09-27 ENCOUNTER — Ambulatory Visit (HOSPITAL_BASED_OUTPATIENT_CLINIC_OR_DEPARTMENT_OTHER): Payer: Commercial Managed Care - PPO | Admitting: Oncology

## 2012-09-27 ENCOUNTER — Ambulatory Visit (HOSPITAL_BASED_OUTPATIENT_CLINIC_OR_DEPARTMENT_OTHER): Payer: Commercial Managed Care - PPO

## 2012-09-27 ENCOUNTER — Ambulatory Visit (HOSPITAL_BASED_OUTPATIENT_CLINIC_OR_DEPARTMENT_OTHER): Payer: Commercial Managed Care - PPO | Admitting: Lab

## 2012-09-27 VITALS — BP 118/70 | HR 67 | Temp 98.9°F | Resp 20 | Ht 65.5 in | Wt 155.4 lb

## 2012-09-27 DIAGNOSIS — C7951 Secondary malignant neoplasm of bone: Secondary | ICD-10-CM

## 2012-09-27 DIAGNOSIS — C50919 Malignant neoplasm of unspecified site of unspecified female breast: Secondary | ICD-10-CM

## 2012-09-27 DIAGNOSIS — Z170 Estrogen receptor positive status [ER+]: Secondary | ICD-10-CM

## 2012-09-27 DIAGNOSIS — Z5111 Encounter for antineoplastic chemotherapy: Secondary | ICD-10-CM

## 2012-09-27 DIAGNOSIS — Z1501 Genetic susceptibility to malignant neoplasm of breast: Secondary | ICD-10-CM

## 2012-09-27 DIAGNOSIS — C7952 Secondary malignant neoplasm of bone marrow: Secondary | ICD-10-CM

## 2012-09-27 LAB — CBC WITH DIFFERENTIAL/PLATELET
BASO%: 0.2 % (ref 0.0–2.0)
EOS%: 0.9 % (ref 0.0–7.0)
LYMPH%: 16.8 % (ref 14.0–49.7)
MCHC: 35.6 g/dL (ref 31.5–36.0)
MCV: 89 fL (ref 79.5–101.0)
MONO#: 0.2 10*3/uL (ref 0.1–0.9)
MONO%: 5.7 % (ref 0.0–14.0)
Platelets: 184 10*3/uL (ref 145–400)
RBC: 4.38 10*6/uL (ref 3.70–5.45)
WBC: 3.9 10*3/uL (ref 3.9–10.3)

## 2012-09-27 LAB — COMPREHENSIVE METABOLIC PANEL (CC13)
ALT: 19 U/L (ref 0–55)
AST: 30 U/L (ref 5–34)
Alkaline Phosphatase: 56 U/L (ref 40–150)
Creatinine: 0.7 mg/dL (ref 0.6–1.1)
Total Bilirubin: 0.66 mg/dL (ref 0.20–1.20)

## 2012-09-27 LAB — CANCER ANTIGEN 27.29: CA 27.29: 25 U/mL (ref 0–39)

## 2012-09-27 MED ORDER — FULVESTRANT 250 MG/5ML IM SOLN
500.0000 mg | Freq: Once | INTRAMUSCULAR | Status: AC
  Administered 2012-09-27: 500 mg via INTRAMUSCULAR
  Filled 2012-09-27: qty 10

## 2012-09-27 MED ORDER — DENOSUMAB 120 MG/1.7ML ~~LOC~~ SOLN
120.0000 mg | Freq: Once | SUBCUTANEOUS | Status: AC
  Administered 2012-09-27: 120 mg via SUBCUTANEOUS
  Filled 2012-09-27: qty 1.7

## 2012-09-27 NOTE — Patient Instructions (Signed)
Call MD for problems 

## 2012-09-27 NOTE — Progress Notes (Signed)
[facility]    OFFICE PROGRESS NOTE   INTERVAL HISTORY:   She returns as scheduled. She feels well. No consistent pain. The pelvic pain has resolved. She has continued daily Arimidex.  Objective:  Vital signs in last 24 hours:  Blood pressure 118/70, pulse 67, temperature 98.9 F (37.2 C), temperature source Oral, resp. rate 20, height 5' 5.5" (1.664 m), weight 155 lb 6.4 oz (70.489 kg).    HEENT: Neck without mass Lymphatics: No cervical, supra-clavicular, axillary, or inguinal nodes Resp: Lungs clear bilaterally Cardio: Regular rate and rhythm GI: No hepatomegaly, nontender Vascular: No leg edema  Skin: Status post bilateral mastectomy with implants in place, no evidence for chest wall tumor recurrence.   Lab Results:  Lab Results  Component Value Date   WBC 3.9 09/27/2012   HGB 13.9 09/27/2012   HCT 39.0 09/27/2012   MCV 89.0 09/27/2012   PLT 184 09/27/2012   ANC 3.0  X-rays: PET scan 09/14/2012-no hypermetabolic soft tissue lesions in the chest, abdomen, or pelvis. Multiple new hypermetabolic bone lesions including a lesion at the low cervical spine, bilateral scapula, ribs, thoracic and lumbar spine, right sternum, and pelvis. The large destructive lesion at the left pubic symphysis now shows no hypermetabolism, status post radiation. The lesion is more sclerotic.  Medications: I have reviewed the patient's current medications.  Assessment/Plan: 1.Multicentric invasive lobular carcinoma of the right breast diagnosed in December of 1999, a right mastectomy followed by adjuvant [facility] chemotherapy, 5 years of tamoxifen, and 5 years of Femara. The Femara was completed in June of 2010. The right breast mass excision in January 2000 was ER positive, PR positive, and HER-2 negative  2. Pain at the left groin area with a CT of the pelvis on 02/07/12 confirming a destructive lytic lesion at the left pubic symphysis, status post a CT-guided biopsy on 02/16/12 confirming  metastatic carcinoma, focally ER positive .  -PET scan 03/07/2012 confirmed multiple hypermetabolic bone metastases with no other evidence of metastatic disease  -Status post palliative radiation to the pelvis beginning on 03/12/2012  -Initiation of Arimidex on 03/27/2012                -initiation of every three-month xgeva on 04/04/2012                       -PET scan  3. BRCA1 variant felt to be a polymorphism  4. Pain/tenderness at the left low anterolateral chest wall-she completed palliative radiation on 05/28/2012 with improvement in the pain. She was also treated with radiation to the thoracic spine.     Disposition:  Ms. Sienkiewicz has metastatic breast cancer. She appears to have "bone only "disease that has progressed since starting Arimidex in July of 2013. I reviewed the PET scan findings and discussed treatment options with Ms. Vosler and her husband. The plan is to continue xgeva for prophylaxis against fractures.  We discussed systemic treatment options including a switch to Faslodex, exemestane/afinitor, and systemic chemotherapy. We specifically discussed Xeloda. We also discussed repeat treatment with tamoxifen.  Ms. Stringfellow would like to proceed with Faslodex. She will begin Faslodex today. She will be treated with Faslodex again in 2 weeks and 4 weeks. She will return for an office visit in 2 months.  Ms. Neault and her husband understand the difficulty of assessing a response to therapy. Her physical examination is unremarkable, there are no soft tissue metastases, and serum tumor markers and the LDH have  been normal in the past. We obtained a repeat chemistry panel, LDH, and CA 27.29 today. We discussed the possibility of obtaining a circulating tumor cell assay.  She agrees to a referral for a second opinion. She will be referred to the breast cancer program at [facility]. A HER-2/neu analysis was not repeated on the May 2013 bone biopsy. We will recontact pathology to see if this  can be performed.     Thornton Papas, MD  09/27/2012  3:57 PM

## 2012-09-27 NOTE — Telephone Encounter (Signed)
appts made and printed for lab

## 2012-10-05 ENCOUNTER — Telehealth: Payer: Self-pay | Admitting: *Deleted

## 2012-10-05 NOTE — Telephone Encounter (Signed)
Per Dr. Mila Palmer, Her -2 Neu can't be done on calcified bone mets. Dr. Truett Perna notified.

## 2012-10-11 ENCOUNTER — Ambulatory Visit (HOSPITAL_BASED_OUTPATIENT_CLINIC_OR_DEPARTMENT_OTHER): Payer: Commercial Managed Care - PPO

## 2012-10-11 ENCOUNTER — Telehealth: Payer: Self-pay | Admitting: *Deleted

## 2012-10-11 VITALS — BP 110/72 | HR 70 | Temp 98.1°F

## 2012-10-11 DIAGNOSIS — C7951 Secondary malignant neoplasm of bone: Secondary | ICD-10-CM

## 2012-10-11 DIAGNOSIS — C50919 Malignant neoplasm of unspecified site of unspecified female breast: Secondary | ICD-10-CM

## 2012-10-11 DIAGNOSIS — Z5111 Encounter for antineoplastic chemotherapy: Secondary | ICD-10-CM

## 2012-10-11 DIAGNOSIS — C7952 Secondary malignant neoplasm of bone marrow: Secondary | ICD-10-CM

## 2012-10-11 MED ORDER — FULVESTRANT 250 MG/5ML IM SOLN
500.0000 mg | Freq: Once | INTRAMUSCULAR | Status: AC
  Administered 2012-10-11: 500 mg via INTRAMUSCULAR
  Filled 2012-10-11: qty 10

## 2012-10-11 MED ORDER — HYDROCODONE-ACETAMINOPHEN 5-325 MG PO TABS: 1.0000 | ORAL_TABLET | Freq: Four times a day (QID) | ORAL | Status: DC | PRN

## 2012-10-11 NOTE — Telephone Encounter (Signed)
Message from pt requesting refill on pain medication. Order received from Dr. Truett Perna. Rx called to pharmacy.

## 2012-10-25 ENCOUNTER — Encounter: Payer: Self-pay | Admitting: Oncology

## 2012-10-25 ENCOUNTER — Ambulatory Visit (HOSPITAL_BASED_OUTPATIENT_CLINIC_OR_DEPARTMENT_OTHER): Payer: Commercial Managed Care - PPO

## 2012-10-25 VITALS — BP 110/62 | HR 85 | Temp 97.4°F

## 2012-10-25 DIAGNOSIS — C7952 Secondary malignant neoplasm of bone marrow: Secondary | ICD-10-CM

## 2012-10-25 DIAGNOSIS — C50919 Malignant neoplasm of unspecified site of unspecified female breast: Secondary | ICD-10-CM

## 2012-10-25 DIAGNOSIS — Z5111 Encounter for antineoplastic chemotherapy: Secondary | ICD-10-CM

## 2012-10-25 MED ORDER — FULVESTRANT 250 MG/5ML IM SOLN
500.0000 mg | Freq: Once | INTRAMUSCULAR | Status: AC
  Administered 2012-10-25: 500 mg via INTRAMUSCULAR
  Filled 2012-10-25: qty 10

## 2012-10-29 ENCOUNTER — Telehealth: Payer: Self-pay | Admitting: Oncology

## 2012-10-29 NOTE — Telephone Encounter (Signed)
Faxed pt medical records to Dr. Avis Epley @ [facility]. Dr Avis Epley nurse will review records and call with appt.  Lvm with pt to return call.

## 2012-11-03 ENCOUNTER — Other Ambulatory Visit: Payer: Self-pay

## 2012-11-16 ENCOUNTER — Telehealth: Payer: Self-pay | Admitting: *Deleted

## 2012-11-16 NOTE — Telephone Encounter (Signed)
Harriett Sine from [facility] wanted to leave contact information. She can be reached at [phone] and fax # [phone].

## 2012-11-22 ENCOUNTER — Telehealth: Payer: Self-pay | Admitting: Oncology

## 2012-11-22 ENCOUNTER — Ambulatory Visit (HOSPITAL_BASED_OUTPATIENT_CLINIC_OR_DEPARTMENT_OTHER): Payer: Commercial Managed Care - PPO

## 2012-11-22 ENCOUNTER — Ambulatory Visit (HOSPITAL_BASED_OUTPATIENT_CLINIC_OR_DEPARTMENT_OTHER): Payer: Commercial Managed Care - PPO | Admitting: Oncology

## 2012-11-22 VITALS — BP 117/66 | HR 66 | Temp 97.3°F | Resp 20 | Ht 65.5 in | Wt 160.3 lb

## 2012-11-22 DIAGNOSIS — C7951 Secondary malignant neoplasm of bone: Secondary | ICD-10-CM

## 2012-11-22 DIAGNOSIS — C50919 Malignant neoplasm of unspecified site of unspecified female breast: Secondary | ICD-10-CM

## 2012-11-22 MED ORDER — FULVESTRANT 250 MG/5ML IM SOLN
500.0000 mg | INTRAMUSCULAR | Status: DC
  Administered 2012-11-22: 500 mg via INTRAMUSCULAR
  Filled 2012-11-22: qty 10

## 2012-11-22 MED ORDER — HYDROCODONE-ACETAMINOPHEN 5-325 MG PO TABS: 1.0000 | ORAL_TABLET | Freq: Four times a day (QID) | ORAL | Status: DC | PRN

## 2012-11-22 NOTE — Progress Notes (Signed)
   [facility]    OFFICE PROGRESS NOTE   INTERVAL HISTORY:   She returns as scheduled. She has been maintained on Faslodex for the past 2 months. She noted a "dizzy "spell on day 9 after the last 2 treatments with Faslodex. She continues to have intermittent pain near the right shoulder blade. She takes hydrocodone and frequently. No other significant pain.  She saw Dr. Avis Epley at [facility] for a second opinion. She reports Dr.Dees agrees with the current therapy.  Objective:  Vital signs in last 24 hours:  Blood pressure 117/66, pulse 66, temperature 97.3 F (36.3 C), temperature source Oral, resp. rate 20, height 5' 5.5" (1.664 m), weight 160 lb 4.8 oz (72.712 kg).    HEENT: Neck without mass Lymphatics: No cervical, supraclavicular, or right axillary nodes. One half-1 cm firm mobile high medial left axillary node Resp: Lungs clear bilaterally Cardio: Regular rate and rhythm GI: No hepatomegaly Vascular: No leg edema Musculoskeletal: No spine tenderness    Medications: I have reviewed the patient's current medications.  Assessment/Plan: 1.Multicentric invasive lobular carcinoma of the right breast diagnosed in December of 1999, a right mastectomy followed by adjuvant [facility] chemotherapy, 5 years of tamoxifen, and 5 years of Femara. The Femara was completed in June of 2010. The right breast mass excision in January 2000 was ER positive, PR positive, and HER-2 negative  2. Pain at the left groin area with a CT of the pelvis on 02/07/12 confirming a destructive lytic lesion at the left pubic symphysis, status post a CT-guided biopsy on 02/16/12 confirming metastatic carcinoma, focally ER positive .  -PET scan 03/07/2012 confirmed multiple hypermetabolic bone metastases with no other evidence of metastatic disease  -Status post palliative radiation to the pelvis beginning on 03/12/2012  -Initiation of Arimidex on 03/27/2012 -initiation of every three-month xgeva on 04/04/2012  -PET  scan September 14 2012-multiple new hypermetabolic bone lesions   -Initiation of Faslodex 09/27/2012 3. BRCA1 variant felt to be a polymorphism  4. Pain/tenderness at the left low anterolateral chest wall-she completed palliative radiation on 05/28/2012 with improvement in the pain. She was also treated with radiation to the thoracic spine.    Disposition:  She appears stable. The plan is to continue monthly Faslodex and every three-month xgeva. She will be scheduled for a restaging PET scan after approximately 6 months of Faslodex therapy. Ms. Hritz will return for an office visit 02/21/2013.   Thornton Papas, MD  11/22/2012  5:23 PM

## 2012-11-22 NOTE — Progress Notes (Signed)
Saw Dr. Marc Morgans at [facility] on 11/09/12 for 2nd opinion-

## 2012-11-22 NOTE — Telephone Encounter (Signed)
gv and printed appt schedule for pt for April thru July

## 2012-11-23 ENCOUNTER — Encounter: Payer: Self-pay | Admitting: Oncology

## 2012-11-23 NOTE — Progress Notes (Signed)
Ran 595.92 xgeva card for payment. 09/27/12 date of service.

## 2012-12-20 ENCOUNTER — Other Ambulatory Visit (HOSPITAL_BASED_OUTPATIENT_CLINIC_OR_DEPARTMENT_OTHER): Payer: Commercial Managed Care - PPO | Admitting: Lab

## 2012-12-20 ENCOUNTER — Other Ambulatory Visit: Payer: Self-pay | Admitting: Oncology

## 2012-12-20 ENCOUNTER — Ambulatory Visit (HOSPITAL_BASED_OUTPATIENT_CLINIC_OR_DEPARTMENT_OTHER): Payer: Commercial Managed Care - PPO

## 2012-12-20 VITALS — BP 104/65 | HR 63 | Temp 97.9°F

## 2012-12-20 DIAGNOSIS — C7951 Secondary malignant neoplasm of bone: Secondary | ICD-10-CM

## 2012-12-20 DIAGNOSIS — C50919 Malignant neoplasm of unspecified site of unspecified female breast: Secondary | ICD-10-CM

## 2012-12-20 DIAGNOSIS — C7952 Secondary malignant neoplasm of bone marrow: Secondary | ICD-10-CM

## 2012-12-20 LAB — BASIC METABOLIC PANEL (CC13)
BUN: 17.7 mg/dL (ref 7.0–26.0)
CO2: 25 mEq/L (ref 22–29)
Chloride: 107 mEq/L (ref 98–107)
Creatinine: 0.7 mg/dL (ref 0.6–1.1)

## 2012-12-20 MED ORDER — FULVESTRANT 250 MG/5ML IM SOLN
500.0000 mg | Freq: Once | INTRAMUSCULAR | Status: AC
  Administered 2012-12-20: 500 mg via INTRAMUSCULAR
  Filled 2012-12-20: qty 10

## 2012-12-20 MED ORDER — DENOSUMAB 120 MG/1.7ML ~~LOC~~ SOLN
120.0000 mg | Freq: Once | SUBCUTANEOUS | Status: AC
  Administered 2012-12-20: 120 mg via SUBCUTANEOUS
  Filled 2012-12-20: qty 1.7

## 2013-01-24 ENCOUNTER — Ambulatory Visit (HOSPITAL_BASED_OUTPATIENT_CLINIC_OR_DEPARTMENT_OTHER): Payer: Commercial Managed Care - PPO

## 2013-01-24 ENCOUNTER — Other Ambulatory Visit: Payer: Self-pay | Admitting: Oncology

## 2013-01-24 VITALS — BP 120/66 | HR 87 | Temp 98.7°F

## 2013-01-24 DIAGNOSIS — Z5111 Encounter for antineoplastic chemotherapy: Secondary | ICD-10-CM

## 2013-01-24 DIAGNOSIS — C50919 Malignant neoplasm of unspecified site of unspecified female breast: Secondary | ICD-10-CM

## 2013-01-24 DIAGNOSIS — C7951 Secondary malignant neoplasm of bone: Secondary | ICD-10-CM

## 2013-01-24 MED ORDER — FULVESTRANT 250 MG/5ML IM SOLN
500.0000 mg | Freq: Once | INTRAMUSCULAR | Status: AC
Start: 2013-01-24 — End: 2013-01-24
  Administered 2013-01-24: 500 mg via INTRAMUSCULAR
  Filled 2013-01-24: qty 10

## 2013-02-04 ENCOUNTER — Other Ambulatory Visit: Payer: Self-pay | Admitting: *Deleted

## 2013-02-04 DIAGNOSIS — C7951 Secondary malignant neoplasm of bone: Secondary | ICD-10-CM

## 2013-02-04 MED ORDER — HYDROCODONE-ACETAMINOPHEN 5-325 MG PO TABS: 1.0000 | ORAL_TABLET | Freq: Four times a day (QID) | ORAL | Status: DC | PRN

## 2013-02-04 NOTE — Telephone Encounter (Signed)
Patient left VM that she was out of her pain medication.

## 2013-02-17 ENCOUNTER — Other Ambulatory Visit: Payer: Self-pay | Admitting: Oncology

## 2013-02-17 DIAGNOSIS — C7951 Secondary malignant neoplasm of bone: Secondary | ICD-10-CM

## 2013-02-21 ENCOUNTER — Ambulatory Visit: Payer: Commercial Managed Care - PPO

## 2013-02-21 ENCOUNTER — Ambulatory Visit (HOSPITAL_BASED_OUTPATIENT_CLINIC_OR_DEPARTMENT_OTHER): Payer: Commercial Managed Care - PPO | Admitting: Oncology

## 2013-02-21 ENCOUNTER — Ambulatory Visit (HOSPITAL_BASED_OUTPATIENT_CLINIC_OR_DEPARTMENT_OTHER): Payer: Commercial Managed Care - PPO

## 2013-02-21 VITALS — BP 119/74 | HR 84 | Temp 97.7°F | Resp 18 | Ht 65.5 in | Wt 165.8 lb

## 2013-02-21 DIAGNOSIS — C50919 Malignant neoplasm of unspecified site of unspecified female breast: Secondary | ICD-10-CM

## 2013-02-21 DIAGNOSIS — C7951 Secondary malignant neoplasm of bone: Secondary | ICD-10-CM

## 2013-02-21 DIAGNOSIS — Z170 Estrogen receptor positive status [ER+]: Secondary | ICD-10-CM

## 2013-02-21 DIAGNOSIS — Z5111 Encounter for antineoplastic chemotherapy: Secondary | ICD-10-CM

## 2013-02-21 MED ORDER — FULVESTRANT 250 MG/5ML IM SOLN
500.0000 mg | Freq: Once | INTRAMUSCULAR | Status: AC
  Administered 2013-02-21: 500 mg via INTRAMUSCULAR
  Filled 2013-02-21: qty 10

## 2013-02-21 NOTE — Progress Notes (Signed)
   [facility]    OFFICE PROGRESS NOTE   INTERVAL HISTORY:   She returns as scheduled. She continues monthly Faslodex. Good appetite and energy level. Her pain has improved. She only takes pain medication in the evening.  Objective:  Vital signs in last 24 hours:  Blood pressure 119/74, pulse 84, temperature 97.7 F (36.5 C), temperature source Oral, resp. rate 18, height 5' 5.5" (1.664 m), weight 165 lb 12.8 oz (75.206 kg), SpO2 98.00%.    HEENT: neck without mass Lymphatics: no cervical or supraclavicular nodes.? Soft mobile left axillary node measuring approximately 1 cm Resp: lungs clear bilaterally Cardio: regular rate and rhythm GI: no hepatomegaly Vascular: no leg edema   Medications: I have reviewed the patient's current medications.  Assessment/Plan: 1.Multicentric invasive lobular carcinoma of the right breast diagnosed in December of 1999, a right mastectomy followed by adjuvant [facility] chemotherapy, 5 years of tamoxifen, and 5 years of Femara. The Femara was completed in June of 2010. The right breast mass excision in January 2000 was ER positive, PR positive, and HER-2 negative  2. Pain at the left groin area with a CT of the pelvis on 02/07/12 confirming a destructive lytic lesion at the left pubic symphysis, status post a CT-guided biopsy on 02/16/12 confirming metastatic carcinoma, focally ER positive .  -PET scan 03/07/2012 confirmed multiple hypermetabolic bone metastases with no other evidence of metastatic disease  -Status post palliative radiation to the pelvis beginning on 03/12/2012  -Initiation of Arimidex on 03/27/2012 -initiation of every three-month xgeva on 04/04/2012  -PET scan September 14 2012-multiple new hypermetabolic bone lesions  -Initiation of Faslodex 09/27/2012  3. BRCA1 variant felt to be a polymorphism  4. Pain/tenderness at the left low anterolateral chest wall-she completed palliative radiation on 05/28/2012 with improvement in  the pain. She was also treated with radiation to the thoracic spine.   Disposition:  She appears stable. The plan is to continue monthly Faslodex. She is maintained on every three-month Xgeva. Ms. Segoviano will be scheduled for a restaging PET scan 04/08/2013. She will return for an office visit 04/10/2013.   Thornton Papas, MD  02/21/2013  10:02 AM

## 2013-02-25 ENCOUNTER — Telehealth: Payer: Self-pay | Admitting: Oncology

## 2013-02-25 NOTE — Telephone Encounter (Signed)
S/w pt re appt for 7/22. Pt already aware pet and 7/3 lb.inj appt.

## 2013-03-21 ENCOUNTER — Other Ambulatory Visit: Payer: Self-pay | Admitting: Oncology

## 2013-03-21 ENCOUNTER — Ambulatory Visit (HOSPITAL_BASED_OUTPATIENT_CLINIC_OR_DEPARTMENT_OTHER): Payer: Commercial Managed Care - PPO

## 2013-03-21 ENCOUNTER — Other Ambulatory Visit (HOSPITAL_BASED_OUTPATIENT_CLINIC_OR_DEPARTMENT_OTHER): Payer: Commercial Managed Care - PPO

## 2013-03-21 VITALS — BP 113/59 | HR 64 | Temp 98.2°F

## 2013-03-21 DIAGNOSIS — C50919 Malignant neoplasm of unspecified site of unspecified female breast: Secondary | ICD-10-CM

## 2013-03-21 DIAGNOSIS — C7951 Secondary malignant neoplasm of bone: Secondary | ICD-10-CM

## 2013-03-21 DIAGNOSIS — Z5111 Encounter for antineoplastic chemotherapy: Secondary | ICD-10-CM

## 2013-03-21 LAB — BASIC METABOLIC PANEL (CC13)
BUN: 17.8 mg/dL (ref 7.0–26.0)
Creatinine: 0.8 mg/dL (ref 0.6–1.1)
Glucose: 93 mg/dl (ref 70–140)

## 2013-03-21 MED ORDER — DENOSUMAB 120 MG/1.7ML ~~LOC~~ SOLN
120.0000 mg | Freq: Once | SUBCUTANEOUS | Status: AC
  Administered 2013-03-21: 120 mg via SUBCUTANEOUS
  Filled 2013-03-21: qty 1.7

## 2013-03-21 MED ORDER — FULVESTRANT 250 MG/5ML IM SOLN
500.0000 mg | INTRAMUSCULAR | Status: DC
  Administered 2013-03-21: 500 mg via INTRAMUSCULAR
  Filled 2013-03-21: qty 10

## 2013-04-08 ENCOUNTER — Encounter (HOSPITAL_COMMUNITY)
Admission: RE | Admit: 2013-04-08 | Discharge: 2013-04-08 | Disposition: A | Discharge status: Home / Self Care | Payer: Commercial Managed Care - PPO | Source: Ambulatory Visit | Attending: Oncology | Admitting: Oncology

## 2013-04-08 DIAGNOSIS — C50919 Malignant neoplasm of unspecified site of unspecified female breast: Secondary | ICD-10-CM | POA: Insufficient documentation

## 2013-04-08 DIAGNOSIS — C7951 Secondary malignant neoplasm of bone: Secondary | ICD-10-CM | POA: Insufficient documentation

## 2013-04-08 LAB — GLUCOSE, CAPILLARY: Glucose-Capillary: 93 mg/dL (ref 70–99)

## 2013-04-08 MED ORDER — FLUDEOXYGLUCOSE F - 18 (FDG) INJECTION
19.1000 | Freq: Once | INTRAVENOUS | Status: AC | PRN
  Administered 2013-04-08: 19.1 via INTRAVENOUS

## 2013-04-09 ENCOUNTER — Other Ambulatory Visit: Payer: Self-pay | Admitting: *Deleted

## 2013-04-09 ENCOUNTER — Ambulatory Visit (HOSPITAL_BASED_OUTPATIENT_CLINIC_OR_DEPARTMENT_OTHER): Payer: Commercial Managed Care - PPO | Admitting: Oncology

## 2013-04-09 VITALS — BP 121/70 | HR 80 | Temp 97.8°F | Resp 18 | Ht 65.0 in | Wt 169.9 lb

## 2013-04-09 DIAGNOSIS — C7951 Secondary malignant neoplasm of bone: Secondary | ICD-10-CM

## 2013-04-09 DIAGNOSIS — C50919 Malignant neoplasm of unspecified site of unspecified female breast: Secondary | ICD-10-CM

## 2013-04-09 MED ORDER — HYDROCODONE-ACETAMINOPHEN 5-325 MG PO TABS: 1.0000 | ORAL_TABLET | Freq: Four times a day (QID) | ORAL | Status: DC | PRN

## 2013-04-09 NOTE — Progress Notes (Signed)
[facility]    OFFICE PROGRESS NOTE   INTERVAL HISTORY:   She returns as scheduled. She continues monthly Faslodex. She was last treated with denosumab on 03/21/2013. She reports discomfort at the bilateral issue when sitting on a hard surface. She has discomfort in the back when she wears a pocketbook on her left shoulder. She has occasional episodes of pain at the back and chest. No consistent pain. Good appetite. She feels better than she did several months ago. Chronic "neuropathy " in the right leg.  Objective:  Vital signs in last 24 hours:  Blood pressure 121/70, pulse 80, temperature 97.8 F (36.6 C), temperature source Oral, resp. rate 18, height 5\' 5"  (1.651 m), weight 169 lb 14.4 oz (77.066 kg).    HEENT: Neck without mass Lymphatics: No cervical, supraclavicular, axillary, or inguinal nodes Resp: Lungs clear bilaterally Cardio: Regular rate and rhythm GI: No hepatomegaly Vascular: No leg edema  Breasts: Status post bilateral mastectomy with implants in place. No evidence for chest wall tumor recurrence. Musculoskeletal: No tenderness over the spine. Mild tenderness at the left side of the pubic bone.   X-rays: PET scan on 04/08/2013 compared to 09/14/2012-marked increase in the distribution of hypermetabolic bone marrow activity. Near confluent hypermetabolic activity in the spine, intense uptake in the proximal right femur, similar uptake in the pelvis, lesions involve both the left and right humerus and multiple ribs. No evidence of metastatic disease outside of the bones.  Medications: I have reviewed the patient's current medications.  Assessment/Plan: 1.Multicentric invasive lobular carcinoma of the right breast diagnosed in December of 1999, a right mastectomy followed by adjuvant [facility] chemotherapy, 5 years of tamoxifen, and 5 years of Femara. The Femara was completed in June of 2010. The right breast mass excision in January 2000 was ER positive,  PR positive, and HER-2 negative  2. Pain at the left groin area with a CT of the pelvis on 02/07/12 confirming a destructive lytic lesion at the left pubic symphysis, status post a CT-guided biopsy on 02/16/12 confirming metastatic carcinoma, focally ER positive .  -PET scan 03/07/2012 confirmed multiple hypermetabolic bone metastases with no other evidence of metastatic disease  -Status post palliative radiation to the pelvis beginning on 03/12/2012  -Initiation of Arimidex on 03/27/2012 -initiation of every three-month xgeva on 04/04/2012  -PET scan September 14 2012-multiple new hypermetabolic bone lesions  -Initiation of Faslodex 09/27/2012  -Restaging PET scan 04/08/2013 with evidence of disease progression in the bones 3. BRCA1 variant felt to be a polymorphism  4. Pain/tenderness at the left low anterolateral chest wall-she completed palliative radiation on 05/28/2012 with improvement in the pain. She was also treated with radiation to the thoracic spine.     Disposition:  Stacy Horn has metastatic breast cancer. She continues to have "bone only "disease. The metastatic tumor burden appears to be progressing while on Faslodex. I discussed treatment options with Stacy Horn and her husband. We reviewed the PET scan images. They understand the difficulty of following her disease activity in the absence of an elevated serum tumor marker or visceral metastatic disease.  We discussed treatment options including exemestane/everolimus and Xeloda. I will refer her back to Dr. Avis Epley for another opinion regarding the indication for a repeat biopsy for HER-2neu and genomic testing. We will also ask Dr. Avis Epley to consider clinical trial options.  The plan is to continue denosumab. Stacy Horn will return for an office visit on 05/02/2013.     Thornton Papas, MD  04/09/2013  1:27 PM

## 2013-04-12 ENCOUNTER — Telehealth: Payer: Self-pay | Admitting: Oncology

## 2013-04-12 NOTE — Telephone Encounter (Signed)
Correction to previous documentation. Pt's f/u w/BS should be 8/14 not 8/15. appt moved to 8/14. S/w pt she is aware of f/u for 8/14 @ 4:15pm. Selena Batten in HIM aware of [facility] referral and will contact pt - pt aware.

## 2013-04-12 NOTE — Telephone Encounter (Signed)
S/w pt re appt for 8/15. Kim in HIM aware of referral to [facility] and will contact pt - pt aware.

## 2013-04-15 ENCOUNTER — Telehealth: Payer: Self-pay | Admitting: Oncology

## 2013-04-15 NOTE — Telephone Encounter (Signed)
Pt appt. With Dr. Avis Epley @ [facility] is 05/10/13@9 :20.  Left vm to return call and faxed pt medical records.

## 2013-04-25 ENCOUNTER — Encounter: Payer: Self-pay | Admitting: Oncology

## 2013-04-26 ENCOUNTER — Other Ambulatory Visit: Payer: Self-pay | Admitting: *Deleted

## 2013-04-29 ENCOUNTER — Telehealth: Payer: Self-pay | Admitting: *Deleted

## 2013-04-29 ENCOUNTER — Encounter: Payer: Self-pay | Admitting: Oncology

## 2013-04-29 ENCOUNTER — Telehealth: Payer: Self-pay | Admitting: Oncology

## 2013-04-29 NOTE — Telephone Encounter (Signed)
Patient left VM that she can't make the 9/19 office visit that was scheduled. She can come the week before or after. Forwarded this request to scheduler.

## 2013-04-30 ENCOUNTER — Telehealth: Payer: Self-pay | Admitting: Oncology

## 2013-05-02 ENCOUNTER — Ambulatory Visit: Payer: Commercial Managed Care - PPO | Admitting: Oncology

## 2013-05-03 ENCOUNTER — Ambulatory Visit: Payer: Commercial Managed Care - PPO | Admitting: Oncology

## 2013-05-12 ENCOUNTER — Encounter: Payer: Self-pay | Admitting: Oncology

## 2013-05-16 ENCOUNTER — Telehealth: Payer: Self-pay | Admitting: *Deleted

## 2013-05-16 NOTE — Telephone Encounter (Signed)
Returned patient's call regarding new prescriptions. Dr Kalman Drape nurse is going to discuss with Dr Truett Perna.

## 2013-05-17 ENCOUNTER — Other Ambulatory Visit: Payer: Self-pay | Admitting: *Deleted

## 2013-05-17 ENCOUNTER — Encounter: Payer: Self-pay | Admitting: Oncology

## 2013-05-17 ENCOUNTER — Telehealth: Payer: Self-pay | Admitting: Oncology

## 2013-05-17 ENCOUNTER — Telehealth: Payer: Self-pay | Admitting: *Deleted

## 2013-05-17 DIAGNOSIS — C50919 Malignant neoplasm of unspecified site of unspecified female breast: Secondary | ICD-10-CM

## 2013-05-17 MED ORDER — EXEMESTANE 25 MG PO TABS: 25.0000 mg | ORAL_TABLET | Freq: Every day | ORAL | Status: DC

## 2013-05-17 NOTE — Telephone Encounter (Signed)
Left VM for patient to verify which [facility] to [facility] to and not to begin until she has completed chemo class and spoken/or seen Dr. Truett Perna. Call back with phone # and best time of day for MD to speak with her about treatment and side effects. Also we have not yet received visit note from Dr. Avis Epley. She will also need to sign informed consent for the chemo medication Afinitor. Request to medical records to obtain note from Dr. Avis Epley. POF sent for chemo class.

## 2013-05-17 NOTE — Telephone Encounter (Signed)
Called pt and left message regarding chemo class

## 2013-05-19 DIAGNOSIS — C50919 Malignant neoplasm of unspecified site of unspecified female breast: Secondary | ICD-10-CM | POA: Insufficient documentation

## 2013-05-19 DIAGNOSIS — C4491 Basal cell carcinoma of skin, unspecified: Secondary | ICD-10-CM | POA: Insufficient documentation

## 2013-05-19 DIAGNOSIS — C7951 Secondary malignant neoplasm of bone: Secondary | ICD-10-CM | POA: Insufficient documentation

## 2013-05-19 DIAGNOSIS — Z9889 Other specified postprocedural states: Secondary | ICD-10-CM | POA: Insufficient documentation

## 2013-05-21 ENCOUNTER — Encounter: Payer: Self-pay | Admitting: Oncology

## 2013-05-21 ENCOUNTER — Encounter: Payer: Self-pay | Admitting: *Deleted

## 2013-05-21 ENCOUNTER — Other Ambulatory Visit: Payer: Commercial Managed Care - PPO

## 2013-05-21 NOTE — Progress Notes (Signed)
Faxed aromasin prescription to [facility].

## 2013-05-24 ENCOUNTER — Encounter: Payer: Self-pay | Admitting: Oncology

## 2013-05-27 ENCOUNTER — Other Ambulatory Visit: Payer: Self-pay | Admitting: *Deleted

## 2013-05-27 ENCOUNTER — Encounter: Payer: Self-pay | Admitting: Oncology

## 2013-05-27 DIAGNOSIS — C50919 Malignant neoplasm of unspecified site of unspecified female breast: Secondary | ICD-10-CM

## 2013-05-27 MED ORDER — HYDROCODONE-ACETAMINOPHEN 5-325 MG PO TABS: 1.0000 | ORAL_TABLET | Freq: Four times a day (QID) | ORAL | Status: DC | PRN

## 2013-05-27 NOTE — Telephone Encounter (Signed)
Notified pt that pain refill request called in to [facility].  Pt verbalized understanding.

## 2013-05-29 ENCOUNTER — Telehealth: Payer: Self-pay | Admitting: *Deleted

## 2013-05-29 DIAGNOSIS — C50919 Malignant neoplasm of unspecified site of unspecified female breast: Secondary | ICD-10-CM

## 2013-05-29 MED ORDER — EVEROLIMUS 10 MG PO TABS: 10.0000 mg | ORAL_TABLET | Freq: Every day | ORAL | Status: DC

## 2013-05-29 NOTE — Telephone Encounter (Signed)
Spoke with pt re: Afinitor and Aromasin.  Pt p/u Afinitor 05/28/13 and took first one today; states she wanted to make sure Dr. Truett Perna knew results of biopsy.  Made her aware that MD knows results and has report from Dr. Avis Epley.  Pt reports no problems at this time.  Instructed pt to call if any problems and confirmed appt 06/14/13.

## 2013-06-07 ENCOUNTER — Ambulatory Visit: Payer: Commercial Managed Care - PPO | Admitting: Oncology

## 2013-06-11 ENCOUNTER — Telehealth: Payer: Self-pay | Admitting: *Deleted

## 2013-06-11 ENCOUNTER — Telehealth: Payer: Self-pay | Admitting: Oncology

## 2013-06-11 DIAGNOSIS — C50919 Malignant neoplasm of unspecified site of unspecified female breast: Secondary | ICD-10-CM

## 2013-06-11 NOTE — Telephone Encounter (Signed)
Call from pt asking if she should keep her dentist appt this week. Was told in chemo education to check with MD first. Instructed her to keep routine cleaning appt. Check with MD prior to any procedure. She voiced understanding.

## 2013-06-11 NOTE — Telephone Encounter (Signed)
pt aware lab  appt per nurse

## 2013-06-14 ENCOUNTER — Ambulatory Visit (HOSPITAL_BASED_OUTPATIENT_CLINIC_OR_DEPARTMENT_OTHER): Payer: Commercial Managed Care - PPO | Admitting: Oncology

## 2013-06-14 ENCOUNTER — Telehealth: Payer: Self-pay | Admitting: Oncology

## 2013-06-14 ENCOUNTER — Other Ambulatory Visit (HOSPITAL_BASED_OUTPATIENT_CLINIC_OR_DEPARTMENT_OTHER): Payer: Commercial Managed Care - PPO | Admitting: Lab

## 2013-06-14 ENCOUNTER — Ambulatory Visit (HOSPITAL_BASED_OUTPATIENT_CLINIC_OR_DEPARTMENT_OTHER): Payer: Commercial Managed Care - PPO

## 2013-06-14 VITALS — BP 115/65 | HR 72 | Temp 99.2°F | Resp 18 | Ht 65.0 in | Wt 172.7 lb

## 2013-06-14 DIAGNOSIS — C50919 Malignant neoplasm of unspecified site of unspecified female breast: Secondary | ICD-10-CM

## 2013-06-14 DIAGNOSIS — D649 Anemia, unspecified: Secondary | ICD-10-CM

## 2013-06-14 DIAGNOSIS — Z23 Encounter for immunization: Secondary | ICD-10-CM

## 2013-06-14 DIAGNOSIS — C7951 Secondary malignant neoplasm of bone: Secondary | ICD-10-CM

## 2013-06-14 DIAGNOSIS — D72819 Decreased white blood cell count, unspecified: Secondary | ICD-10-CM

## 2013-06-14 LAB — COMPREHENSIVE METABOLIC PANEL (CC13)
BUN: 15.3 mg/dL (ref 7.0–26.0)
CO2: 26 mEq/L (ref 22–29)
Glucose: 80 mg/dl (ref 70–140)
Sodium: 142 mEq/L (ref 136–145)
Total Bilirubin: 0.34 mg/dL (ref 0.20–1.20)
Total Protein: 7.7 g/dL (ref 6.4–8.3)

## 2013-06-14 LAB — CBC WITH DIFFERENTIAL/PLATELET
Basophils Absolute: 0 10*3/uL (ref 0.0–0.1)
Eosinophils Absolute: 0 10*3/uL (ref 0.0–0.5)
HCT: 29.8 % — ABNORMAL LOW (ref 34.8–46.6)
HGB: 10.5 g/dL — ABNORMAL LOW (ref 11.6–15.9)
LYMPH%: 25.8 % (ref 14.0–49.7)
MCV: 88.7 fL (ref 79.5–101.0)
MONO#: 0.3 10*3/uL (ref 0.1–0.9)
NEUT#: 1.7 10*3/uL (ref 1.5–6.5)
NEUT%: 62.8 % (ref 38.4–76.8)
Platelets: 163 10*3/uL (ref 145–400)
RBC: 3.36 10*6/uL — ABNORMAL LOW (ref 3.70–5.45)
WBC: 2.7 10*3/uL — ABNORMAL LOW (ref 3.9–10.3)

## 2013-06-14 MED ORDER — EVEROLIMUS 10 MG PO TABS: 10.0000 mg | ORAL_TABLET | Freq: Every day | ORAL | Status: DC

## 2013-06-14 MED ORDER — EXEMESTANE 25 MG PO TABS: 25.0000 mg | ORAL_TABLET | Freq: Every day | ORAL | Status: DC

## 2013-06-14 MED ORDER — INFLUENZA VAC SPLIT QUAD 0.5 ML IM SUSP
0.5000 mL | Freq: Once | INTRAMUSCULAR | Status: AC
  Administered 2013-06-14: 0.5 mL via INTRAMUSCULAR
  Filled 2013-06-14: qty 0.5

## 2013-06-14 MED ORDER — DENOSUMAB 120 MG/1.7ML ~~LOC~~ SOLN
120.0000 mg | Freq: Once | SUBCUTANEOUS | Status: AC
  Administered 2013-06-14: 120 mg via SUBCUTANEOUS
  Filled 2013-06-14: qty 1.7

## 2013-06-14 NOTE — Telephone Encounter (Signed)
Gave pt appt for lab and Md on October and November 2014

## 2013-06-14 NOTE — Progress Notes (Signed)
[facility]    OFFICE PROGRESS NOTE   INTERVAL HISTORY:   She returns for scheduled visit. She underwent a left iliac bone biopsy at [facility] on 05/22/2013. The metastatic breast cancer returned negative for HER-2/neu amplification.  She began afinitor and Aromasin on 05/29/2013. No mouth sores, skin rash, or diarrhea. She has noted early satiety and mild subxiphoid discomfort since beginning the afinitor. Stable back and pelvic pain. She had headaches after beginning the systemic therapy, these have improved. She has noted intermittent low-grade fever since starting the current regimen. No associated symptoms. No tooth or jaw pain. She was last treated with xgeva on 03/21/2013.  Objective:  Vital signs in last 24 hours:  Blood pressure 115/65, pulse 72, temperature 99.2 F (37.3 C), temperature source Oral, resp. rate 18, height 5\' 5"  (1.651 m), weight 172 lb 11.2 oz (78.336 kg).    HEENT: Neck without mass Lymphatics: No cervical, supraclavicular, or right axillary nodes. 1 cm mobile left axillary node Resp: Lungs clear bilaterally Cardio: Regular rate and rhythm GI: No hepatomegaly, no mass, nontender Vascular: No leg edema Musculoskeletal: Pelvic bone biopsy site without evidence of infection      Lab Results:  Lab Results  Component Value Date   WBC 2.7* 06/14/2013   HGB 10.5* 06/14/2013   HCT 29.8* 06/14/2013   MCV 88.7 06/14/2013   PLT 163 06/14/2013   ANC 1.7    Medications: I have reviewed the patient's current medications.  Assessment/Plan: 1.Multicentric invasive lobular carcinoma of the right breast diagnosed in December of 1999, a right mastectomy followed by adjuvant [facility] chemotherapy, 5 years of tamoxifen, and 5 years of Femara. The Femara was completed in June of 2010. The right breast mass excision in January 2000 was ER positive, PR positive, and HER-2 negative  2. Pain at the left groin area with a CT of the pelvis on 02/07/12 confirming a  destructive lytic lesion at the left pubic symphysis, status post a CT-guided biopsy on 02/16/12 confirming metastatic carcinoma, focally ER positive .  -PET scan 03/07/2012 confirmed multiple hypermetabolic bone metastases with no other evidence of metastatic disease  -Status post palliative radiation to the pelvis beginning on 03/12/2012  -Initiation of Arimidex on 03/27/2012 -initiation of every three-month xgeva on 04/04/2012  -PET scan September 14 2012-multiple new hypermetabolic bone lesions  -Initiation of Faslodex 09/27/2012  -Restaging PET scan 04/08/2013 with evidence of disease progression in the bones  -Initiation of Aromasin/afinitor 05/29/2013 -Iliac biopsy at [facility] 05/22/2013 confirming HER-2 negative metastatic breast 3. BRCA1 variant felt to be a polymorphism  4. Pain/tenderness at the left low anterolateral chest wall-she completed palliative radiation on 05/28/2012 with improvement in the pain. She was also treated with radiation to the thoracic spine.  5. Anemia/leukopenia-likely secondary to breast cancer involving the bone marrow and radiation   Disposition:  She appears to be tolerating the Aromasin/afinitor well. She will continue this regimen. Ms. Kueker will contact us if the upper abdominal discomfort persist. She declined an acid therapy today. I suspect her symptoms may be related to the afinitor.  She will continue every three-month xgeva. She will return for a CBC prior to leaving on vacation 07/01/2013. She is scheduled for an office visit 07/29/2013.  Ms. Cressey may be a candidate for a future clinical trial at [facility]. Her tumor was admitted for sequencing analysis at [facility].  She received an influenza vaccine today.  Approximately 25 minutes were spent with the patient today. The majority of the  time was used for counseling and coordination of care.   Thornton Papas, MD  06/14/2013  4:13 PM

## 2013-06-28 ENCOUNTER — Telehealth: Payer: Self-pay | Admitting: *Deleted

## 2013-06-28 DIAGNOSIS — C50919 Malignant neoplasm of unspecified site of unspecified female breast: Secondary | ICD-10-CM

## 2013-06-28 MED ORDER — HYDROCODONE-ACETAMINOPHEN 5-325 MG PO TABS: 1.0000 | ORAL_TABLET | Freq: Four times a day (QID) | ORAL | Status: DC | PRN

## 2013-06-28 NOTE — Telephone Encounter (Signed)
Call from pt requesting Hydrocodone refill. She will be out of town for 3 weeks and will run out before she returns. Had refills on last prescription but refills are not being honored. Instructed her to pick up new Rx when she is here 10/13 for lab. She voiced understanding.

## 2013-07-01 ENCOUNTER — Other Ambulatory Visit (HOSPITAL_BASED_OUTPATIENT_CLINIC_OR_DEPARTMENT_OTHER): Payer: Commercial Managed Care - PPO

## 2013-07-01 DIAGNOSIS — C50919 Malignant neoplasm of unspecified site of unspecified female breast: Secondary | ICD-10-CM

## 2013-07-01 DIAGNOSIS — C7951 Secondary malignant neoplasm of bone: Secondary | ICD-10-CM

## 2013-07-01 LAB — COMPREHENSIVE METABOLIC PANEL (CC13)
ALT: 98 U/L — ABNORMAL HIGH (ref 0–55)
Albumin: 3.7 g/dL (ref 3.5–5.0)
Alkaline Phosphatase: 85 U/L (ref 40–150)
Anion Gap: 14 mEq/L — ABNORMAL HIGH (ref 3–11)
BUN: 13.1 mg/dL (ref 7.0–26.0)
CO2: 23 mEq/L (ref 22–29)
Calcium: 9.4 mg/dL (ref 8.4–10.4)
Chloride: 106 mEq/L (ref 98–109)
Creatinine: 0.7 mg/dL (ref 0.6–1.1)
Glucose: 86 mg/dl (ref 70–140)
Sodium: 143 mEq/L (ref 136–145)
Total Protein: 7.8 g/dL (ref 6.4–8.3)

## 2013-07-01 LAB — CBC WITH DIFFERENTIAL/PLATELET
BASO%: 0.5 % (ref 0.0–2.0)
Basophils Absolute: 0 10*3/uL (ref 0.0–0.1)
Eosinophils Absolute: 0 10*3/uL (ref 0.0–0.5)
HCT: 31 % — ABNORMAL LOW (ref 34.8–46.6)
MCH: 30 pg (ref 25.1–34.0)
MCHC: 34 g/dL (ref 31.5–36.0)
MONO#: 0.3 10*3/uL (ref 0.1–0.9)
NEUT#: 1.9 10*3/uL (ref 1.5–6.5)
Platelets: 183 10*3/uL (ref 145–400)
RBC: 3.51 10*6/uL — ABNORMAL LOW (ref 3.70–5.45)
WBC: 3.1 10*3/uL — ABNORMAL LOW (ref 3.9–10.3)
lymph#: 0.9 10*3/uL (ref 0.9–3.3)

## 2013-07-29 ENCOUNTER — Ambulatory Visit (HOSPITAL_BASED_OUTPATIENT_CLINIC_OR_DEPARTMENT_OTHER): Payer: Commercial Managed Care - PPO | Admitting: Oncology

## 2013-07-29 ENCOUNTER — Telehealth: Payer: Self-pay | Admitting: Oncology

## 2013-07-29 ENCOUNTER — Other Ambulatory Visit (HOSPITAL_BASED_OUTPATIENT_CLINIC_OR_DEPARTMENT_OTHER): Payer: Commercial Managed Care - PPO | Admitting: Lab

## 2013-07-29 VITALS — BP 103/68 | HR 72 | Temp 97.7°F | Resp 20 | Ht 65.0 in | Wt 172.2 lb

## 2013-07-29 DIAGNOSIS — D649 Anemia, unspecified: Secondary | ICD-10-CM

## 2013-07-29 DIAGNOSIS — C50919 Malignant neoplasm of unspecified site of unspecified female breast: Secondary | ICD-10-CM

## 2013-07-29 DIAGNOSIS — R748 Abnormal levels of other serum enzymes: Secondary | ICD-10-CM

## 2013-07-29 DIAGNOSIS — C7951 Secondary malignant neoplasm of bone: Secondary | ICD-10-CM

## 2013-07-29 LAB — COMPREHENSIVE METABOLIC PANEL (CC13)
ALT: 111 U/L — ABNORMAL HIGH (ref 0–55)
AST: 69 U/L — ABNORMAL HIGH (ref 5–34)
Alkaline Phosphatase: 89 U/L (ref 40–150)
Anion Gap: 9 mEq/L (ref 3–11)
CO2: 25 mEq/L (ref 22–29)
Calcium: 9.3 mg/dL (ref 8.4–10.4)
Chloride: 107 mEq/L (ref 98–109)
Creatinine: 0.7 mg/dL (ref 0.6–1.1)
Glucose: 105 mg/dl (ref 70–140)
Sodium: 141 mEq/L (ref 136–145)
Total Protein: 7.3 g/dL (ref 6.4–8.3)

## 2013-07-29 LAB — CBC WITH DIFFERENTIAL/PLATELET
BASO%: 0.5 % (ref 0.0–2.0)
EOS%: 1.5 % (ref 0.0–7.0)
HCT: 27.5 % — ABNORMAL LOW (ref 34.8–46.6)
MCH: 29.5 pg (ref 25.1–34.0)
MCHC: 34 g/dL (ref 31.5–36.0)
NEUT#: 1.5 10*3/uL (ref 1.5–6.5)
NEUT%: 62.8 % (ref 38.4–76.8)
Platelets: 146 10*3/uL (ref 145–400)
RBC: 3.17 10*6/uL — ABNORMAL LOW (ref 3.70–5.45)
RDW: 15.6 % — ABNORMAL HIGH (ref 11.2–14.5)
WBC: 2.4 10*3/uL — ABNORMAL LOW (ref 3.9–10.3)
lymph#: 0.7 10*3/uL — ABNORMAL LOW (ref 0.9–3.3)

## 2013-07-29 MED ORDER — HYDROCODONE-ACETAMINOPHEN 5-325 MG PO TABS: 1.0000 | ORAL_TABLET | Freq: Four times a day (QID) | ORAL | Status: DC | PRN

## 2013-07-29 NOTE — Progress Notes (Signed)
   [facility]    OFFICE PROGRESS NOTE   INTERVAL HISTORY:   Stacy Horn returns for scheduled followup of metastatic breast cancer. She has been maintained on Aromasin and afinitor for approximately the past 2 months. She has noted an acne type rash over the face. No discrete mouth ulcers. No diarrhea. Mild hot flashes. She reports stable pain in the right leg. She recently returned from a vacation to [place]. There is tenderness at the gumline surrounding a lower tooth.  Objective:  Vital signs in last 24 hours:  Blood pressure 103/68, pulse 72, temperature 97.7 F (36.5 C), temperature source Oral, resp. rate 20, height 5\' 5"  (1.651 m), weight 172 lb 3.2 oz (78.109 kg), SpO2 100.00%.    HEENT: Neck without mass. No thrush or ulcers. No apparent abnormality at the lower gums Resp: Lungs clear bilateral Cardio: Regular rate and rhythm GI: No hepatomegaly Vascular: The right lower leg is slightly larger than left side, no edema  Skin: No rash over the trunk. Review acne type lesions over the face   Portacath/PICC-without erythema  Lab Results:  Lab Results  Component Value Date   WBC 2.4* 07/29/2013   HGB 9.3* 07/29/2013   HCT 27.5* 07/29/2013   MCV 86.7 07/29/2013   PLT 146 07/29/2013   ANC 1.5  Alkaline phosphatase 89, AST 69, ALT 111, bilirubin 0.38    Medications: I have reviewed the patient's current medications.  Assessment/Plan: 1.Multicentric invasive lobular carcinoma of the right breast diagnosed in December of 1999, a right mastectomy followed by adjuvant [facility] chemotherapy, 5 years of tamoxifen, and 5 years of Femara. The Femara was completed in June of 2010. The right breast mass excision in January 2000 was ER positive, PR positive, and HER-2 negative  2. Pain at the left groin area with a CT of the pelvis on 02/07/12 confirming a destructive lytic lesion at the left pubic symphysis, status post a CT-guided biopsy on 02/16/12 confirming  metastatic carcinoma, focally ER positive .  -PET scan 03/07/2012 confirmed multiple hypermetabolic bone metastases with no other evidence of metastatic disease  -Status post palliative radiation to the pelvis beginning on 03/12/2012  -Initiation of Arimidex on 03/27/2012 -initiation of every three-month xgeva on 04/04/2012  -PET scan September 14 2012-multiple new hypermetabolic bone lesions  -Initiation of Faslodex 09/27/2012  -Restaging PET scan 04/08/2013 with evidence of disease progression in the bones  -Initiation of Aromasin/afinitor 05/29/2013  -Iliac biopsy at [facility] 05/22/2013 confirming HER-2 negative metastatic breast  3. BRCA1 variant felt to be a polymorphism  4. Pain/tenderness at the left low anterolateral chest wall-she completed palliative radiation on 05/28/2012 with improvement in the pain. She was also treated with radiation to the thoracic spine.  5. Anemia/leukopenia-likely secondary to breast cancer involving the bone marrow and radiation on and afinitor 6. Elevated liver enzymes-? Related to afinitor,? Secondary to metastatic breast cancer  Disposition:  Her overall status appears unchanged. She will continue the current systemic therapy regimen. She will return for an office and lab visit in one month. We will decide on the timing of a restaging PET scan at the next office visit. She will continue every three-month xgeva.   Thornton Papas, MD  07/29/2013  1:59 PM

## 2013-07-29 NOTE — Telephone Encounter (Signed)
Gave pt appt for lab and Md for december 2014 °

## 2013-07-31 ENCOUNTER — Other Ambulatory Visit: Payer: Self-pay | Admitting: *Deleted

## 2013-08-29 ENCOUNTER — Telehealth: Payer: Self-pay | Admitting: Oncology

## 2013-08-29 ENCOUNTER — Ambulatory Visit (HOSPITAL_BASED_OUTPATIENT_CLINIC_OR_DEPARTMENT_OTHER): Payer: Commercial Managed Care - PPO

## 2013-08-29 ENCOUNTER — Other Ambulatory Visit (HOSPITAL_BASED_OUTPATIENT_CLINIC_OR_DEPARTMENT_OTHER): Payer: Commercial Managed Care - PPO

## 2013-08-29 ENCOUNTER — Ambulatory Visit (HOSPITAL_BASED_OUTPATIENT_CLINIC_OR_DEPARTMENT_OTHER): Payer: Commercial Managed Care - PPO | Admitting: Oncology

## 2013-08-29 VITALS — BP 123/71 | HR 82 | Temp 97.0°F | Resp 20 | Ht 65.0 in | Wt 170.6 lb

## 2013-08-29 DIAGNOSIS — R748 Abnormal levels of other serum enzymes: Secondary | ICD-10-CM

## 2013-08-29 DIAGNOSIS — C7951 Secondary malignant neoplasm of bone: Secondary | ICD-10-CM

## 2013-08-29 DIAGNOSIS — C50919 Malignant neoplasm of unspecified site of unspecified female breast: Secondary | ICD-10-CM

## 2013-08-29 DIAGNOSIS — D649 Anemia, unspecified: Secondary | ICD-10-CM

## 2013-08-29 DIAGNOSIS — D72819 Decreased white blood cell count, unspecified: Secondary | ICD-10-CM

## 2013-08-29 LAB — COMPREHENSIVE METABOLIC PANEL (CC13)
AST: 57 U/L — ABNORMAL HIGH (ref 5–34)
Alkaline Phosphatase: 93 U/L (ref 40–150)
BUN: 17.2 mg/dL (ref 7.0–26.0)
Calcium: 9.4 mg/dL (ref 8.4–10.4)
Creatinine: 0.7 mg/dL (ref 0.6–1.1)
Total Bilirubin: 0.45 mg/dL (ref 0.20–1.20)

## 2013-08-29 LAB — CBC WITH DIFFERENTIAL/PLATELET
BASO%: 0.2 % (ref 0.0–2.0)
Basophils Absolute: 0 10*3/uL (ref 0.0–0.1)
EOS%: 1 % (ref 0.0–7.0)
HCT: 27.3 % — ABNORMAL LOW (ref 34.8–46.6)
HGB: 9.4 g/dL — ABNORMAL LOW (ref 11.6–15.9)
LYMPH%: 21.7 % (ref 14.0–49.7)
MCH: 29.1 pg (ref 25.1–34.0)
MCHC: 34.6 g/dL (ref 31.5–36.0)
MCV: 84 fL (ref 79.5–101.0)
MONO#: 0.1 10*3/uL (ref 0.1–0.9)
MONO%: 5 % (ref 0.0–14.0)
NEUT%: 72.1 % (ref 38.4–76.8)
RBC: 3.25 10*6/uL — ABNORMAL LOW (ref 3.70–5.45)
WBC: 2.6 10*3/uL — ABNORMAL LOW (ref 3.9–10.3)

## 2013-08-29 MED ORDER — HYDROCODONE-ACETAMINOPHEN 5-325 MG PO TABS: 1.0000 | ORAL_TABLET | Freq: Four times a day (QID) | ORAL | Status: DC | PRN

## 2013-08-29 MED ORDER — DENOSUMAB 120 MG/1.7ML ~~LOC~~ SOLN
120.0000 mg | Freq: Once | SUBCUTANEOUS | Status: AC
  Administered 2013-08-29: 120 mg via SUBCUTANEOUS
  Filled 2013-08-29: qty 1.7

## 2013-08-29 NOTE — Progress Notes (Signed)
   [facility]    OFFICE PROGRESS NOTE   INTERVAL HISTORY:   She returns for scheduled followup of metastatic breast cancer. She continues Aromasin and Afinitor. No mouth sores. Mild pain at the low back after sitting. She has an acne type rash over the face and chest. She has noted decreased energy. No dyspnea. No tooth or jaw pain.  Objective:  Vital signs in last 24 hours:  Blood pressure 123/71, pulse 82, temperature 97 F (36.1 C), temperature source Oral, resp. rate 20, height 5\' 5"  (1.651 m), weight 170 lb 9.6 oz (77.384 kg).    HEENT: No thrush or ulcers Lymphatics: No cervical, supraclavicular, or right axillary nodes. 1 cm mobile high left axillary node Resp: Lungs clear bilaterally Cardio: Regular rate and rhythm GI: No hepatomegaly, nontender Vascular: No leg edema  Skin: Few erythematous acne type lesions over the upper chest and neck    Lab Results:  Lab Results  Component Value Date   WBC 2.6* 08/29/2013   HGB 9.4* 08/29/2013   HCT 27.3* 08/29/2013   MCV 84.0 08/29/2013   PLT 157 08/29/2013   ANC 1.9  Creatinine 0.7, albumin 3.7, AST 69, ALT 111, bili of 0.38    Medications: I have reviewed the patient's current medications.  Assessment/Plan: 1.Multicentric invasive lobular carcinoma of the right breast diagnosed in December of 1999, a right mastectomy followed by adjuvant [facility] chemotherapy, 5 years of tamoxifen, and 5 years of Femara. The Femara was completed in June of 2010. The right breast mass excision in January 2000 was ER positive, PR positive, and HER-2 negative  2. Pain at the left groin area with a CT of the pelvis on 02/07/12 confirming a destructive lytic lesion at the left pubic symphysis, status post a CT-guided biopsy on 02/16/12 confirming metastatic carcinoma, focally ER positive .  -PET scan 03/07/2012 confirmed multiple hypermetabolic bone metastases with no other evidence of metastatic disease  -Status post palliative  radiation to the pelvis beginning on 03/12/2012  -Initiation of Arimidex on 03/27/2012 -initiation of every three-month xgeva on 04/04/2012  -PET scan September 14 2012-multiple new hypermetabolic bone lesions  -Initiation of Faslodex 09/27/2012  -Restaging PET scan 04/08/2013 with evidence of disease progression in the bones  -Initiation of Aromasin/afinitor 05/29/2013  -Iliac biopsy at [facility] 05/22/2013 confirming HER-2 negative metastatic breast  3. BRCA1 variant felt to be a polymorphism  4. Pain/tenderness at the left low anterolateral chest wall-she completed palliative radiation on 05/28/2012 with improvement in the pain. She was also treated with radiation to the thoracic spine.  5. Anemia/leukopenia-likely secondary to breast cancer involving the bone marrow and radiation /afinitor  6. Elevated liver enzymes-? Related to afinitor,? Secondary to metastatic breast cancer -stable   Disposition:  Her overall status appears unchanged. The plan is to continue the current systemic therapy. She received xgeva today.  She will return for an office and lab visit 10/03/2012. We plan for a restaging PET scan in late February of 2015. She plans a trip to [place] in early February of 2015.    Thornton Papas, MD  08/29/2013  2:29 PM

## 2013-08-29 NOTE — Telephone Encounter (Signed)
appts made per 12/11 POF AVS and CAL given shh °

## 2013-09-24 ENCOUNTER — Other Ambulatory Visit: Payer: Self-pay | Admitting: *Deleted

## 2013-09-24 DIAGNOSIS — C7951 Secondary malignant neoplasm of bone: Principal | ICD-10-CM

## 2013-09-24 DIAGNOSIS — C50919 Malignant neoplasm of unspecified site of unspecified female breast: Secondary | ICD-10-CM

## 2013-09-24 MED ORDER — HYDROCODONE-ACETAMINOPHEN 5-325 MG PO TABS: 1.0000 | ORAL_TABLET | Freq: Four times a day (QID) | ORAL | Status: DC | PRN

## 2013-09-24 NOTE — Telephone Encounter (Signed)
VM requesting to pick up refill and requesting larger quantity.

## 2013-10-03 ENCOUNTER — Other Ambulatory Visit (HOSPITAL_BASED_OUTPATIENT_CLINIC_OR_DEPARTMENT_OTHER): Payer: Commercial Managed Care - PPO

## 2013-10-03 ENCOUNTER — Telehealth: Payer: Self-pay | Admitting: Oncology

## 2013-10-03 ENCOUNTER — Ambulatory Visit (HOSPITAL_BASED_OUTPATIENT_CLINIC_OR_DEPARTMENT_OTHER): Payer: Commercial Managed Care - PPO | Admitting: Oncology

## 2013-10-03 ENCOUNTER — Ambulatory Visit (HOSPITAL_COMMUNITY)
Admission: RE | Admit: 2013-10-03 | Discharge: 2013-10-03 | Disposition: A | Discharge status: Home / Self Care | Payer: Commercial Managed Care - PPO | Source: Ambulatory Visit | Attending: Oncology | Admitting: Oncology

## 2013-10-03 VITALS — BP 125/68 | HR 75 | Temp 98.7°F | Resp 18 | Ht 65.0 in | Wt 167.4 lb

## 2013-10-03 DIAGNOSIS — D72819 Decreased white blood cell count, unspecified: Secondary | ICD-10-CM

## 2013-10-03 DIAGNOSIS — C50919 Malignant neoplasm of unspecified site of unspecified female breast: Secondary | ICD-10-CM

## 2013-10-03 DIAGNOSIS — M25519 Pain in unspecified shoulder: Secondary | ICD-10-CM

## 2013-10-03 DIAGNOSIS — D649 Anemia, unspecified: Secondary | ICD-10-CM

## 2013-10-03 DIAGNOSIS — M259 Joint disorder, unspecified: Secondary | ICD-10-CM | POA: Insufficient documentation

## 2013-10-03 DIAGNOSIS — C7952 Secondary malignant neoplasm of bone marrow: Secondary | ICD-10-CM

## 2013-10-03 DIAGNOSIS — C7951 Secondary malignant neoplasm of bone: Secondary | ICD-10-CM

## 2013-10-03 LAB — COMPREHENSIVE METABOLIC PANEL (CC13)
ALBUMIN: 4 g/dL (ref 3.5–5.0)
ALK PHOS: 82 U/L (ref 40–150)
ALT: 81 U/L — ABNORMAL HIGH (ref 0–55)
AST: 52 U/L — ABNORMAL HIGH (ref 5–34)
Anion Gap: 11 mEq/L (ref 3–11)
BILIRUBIN TOTAL: 0.45 mg/dL (ref 0.20–1.20)
BUN: 13.2 mg/dL (ref 7.0–26.0)
CO2: 25 mEq/L (ref 22–29)
Calcium: 9.4 mg/dL (ref 8.4–10.4)
Chloride: 108 mEq/L (ref 98–109)
Creatinine: 0.8 mg/dL (ref 0.6–1.1)
Glucose: 97 mg/dl (ref 70–140)
POTASSIUM: 3.8 meq/L (ref 3.5–5.1)
SODIUM: 143 meq/L (ref 136–145)
TOTAL PROTEIN: 7.8 g/dL (ref 6.4–8.3)

## 2013-10-03 LAB — CBC WITH DIFFERENTIAL/PLATELET
BASO%: 0.5 % (ref 0.0–2.0)
Basophils Absolute: 0 10*3/uL (ref 0.0–0.1)
EOS%: 2.1 % (ref 0.0–7.0)
Eosinophils Absolute: 0.1 10*3/uL (ref 0.0–0.5)
HCT: 28 % — ABNORMAL LOW (ref 34.8–46.6)
HGB: 9.4 g/dL — ABNORMAL LOW (ref 11.6–15.9)
LYMPH%: 22.3 % (ref 14.0–49.7)
MCH: 27.8 pg (ref 25.1–34.0)
MCHC: 33.6 g/dL (ref 31.5–36.0)
MCV: 82.9 fL (ref 79.5–101.0)
MONO#: 0.2 10*3/uL (ref 0.1–0.9)
MONO%: 6.6 % (ref 0.0–14.0)
NEUT#: 1.9 10*3/uL (ref 1.5–6.5)
NEUT%: 68.5 % (ref 38.4–76.8)
PLATELETS: 187 10*3/uL (ref 145–400)
RBC: 3.38 10*6/uL — ABNORMAL LOW (ref 3.70–5.45)
RDW: 17.8 % — AB (ref 11.2–14.5)
WBC: 2.8 10*3/uL — AB (ref 3.9–10.3)
lymph#: 0.6 10*3/uL — ABNORMAL LOW (ref 0.9–3.3)

## 2013-10-03 LAB — TECHNOLOGIST REVIEW

## 2013-10-03 NOTE — Telephone Encounter (Signed)
gv and printed appt sched and avs for pt for March...sent pt to radiology

## 2013-10-03 NOTE — Progress Notes (Signed)
 [facility]    OFFICE PROGRESS NOTE   INTERVAL HISTORY:   Ms. Muehl returns for scheduled followup of metastatic breast cancer. She continues Aromasin and Afinitor. No mouth sores or diarrhea. She has an acne type rash over the face and neck. She ran out of hydrocodone recently and developed increased right leg pain. The back and pelvic pain are stable. She has developed pain at the left upper arm with movement.  Objective:  Vital signs in last 24 hours:  Blood pressure 125/68, pulse 75, temperature 98.7 F (37.1 C), temperature source Oral, resp. rate 18, height 5' 5" (1.651 m), weight 167 lb 6.4 oz (75.932 kg).    HEENT: Neck without mass, no thrush or ulcers Lymphatics: Mobile 1/2-1 cm left axillary node Resp: Lungs clear bilaterally Cardio: Rate and rhythm GI: No hepatomegaly, nontender Vascular: No leg edema  Skin: Acne type rash at the right greater than left neck Musculoskeletal: Mild pain at the left upper arm with abduction at the left shoulder. No tenderness over the left humerus. No mass.   Lab Results:  Lab Results  Component Value Date   WBC 2.8* 10/03/2013   HGB 9.4* 10/03/2013   HCT 28.0* 10/03/2013   MCV 82.9 10/03/2013   PLT 187 10/03/2013   NEUTROABS 1.9 10/03/2013   Potassium 3.8, creatinine 0.8, alkaline phosphatase 82, AST 52, ALT 81, bilirubin 0.45   Medications: I have reviewed the patient's current medications.  Assessment/Plan: 1.Multicentric invasive lobular carcinoma of the right breast diagnosed in December of 1999, a right mastectomy followed by adjuvant [facility] chemotherapy, 5 years of tamoxifen, and 5 years of Femara. The Femara was completed in June of 2010. The right breast mass excision in January 2000 was ER positive, PR positive, and HER-2 negative  2. Pain at the left groin area with a CT of the pelvis on 02/07/12 confirming a destructive lytic lesion at the left pubic symphysis, status post a CT-guided biopsy on 02/16/12  confirming metastatic carcinoma, focally ER positive .  -PET scan 03/07/2012 confirmed multiple hypermetabolic bone metastases with no other evidence of metastatic disease  -Status post palliative radiation to the pelvis beginning on 03/12/2012  -Initiation of Arimidex on 03/27/2012 -initiation of every three-month xgeva on 04/04/2012  -PET scan September 14 2012-multiple new hypermetabolic bone lesions  -Initiation of Faslodex 09/27/2012  -Restaging PET scan 04/08/2013 with evidence of disease progression in the bones  -Initiation of Aromasin/afinitor 05/29/2013  -Iliac biopsy at [facility] 05/22/2013 confirming HER-2 negative metastatic breast  3. BRCA1 variant felt to be a polymorphism  4. Pain/tenderness at the left low anterolateral chest wall-she completed palliative radiation on 05/28/2012 with improvement in the pain. She was also treated with radiation to the thoracic spine.  5. Anemia/leukopenia-likely secondary to breast cancer involving the bone marrow and radiation /afinitor -stable 6. Elevated liver enzymes-? Related to afinitor,? Secondary to metastatic breast cancer -stable  7. Pain at the left upper arm-I reviewed the PET scan from July of 2014 and there appears to be a hyper lesion at the left upper humerus, she will be referred for a plain x-ray of the left humerus today   Disposition:  She appears unchanged. She appears to be tolerating the Aromasin and Afinitor well. The plan is to continue the current therapy. She will return for an office visit and restaging PET scan in early March. She will contact us in the interim for new symptoms. We will followup on the plain x-ray of the left humerus   today and refer her to radiation oncology/orthopedics as indicated.   , , MD  10/03/2013  9:18 AM    

## 2013-10-04 ENCOUNTER — Telehealth: Payer: Self-pay | Admitting: *Deleted

## 2013-10-04 NOTE — Telephone Encounter (Signed)
Pt returned call, she reviewed results on MyChart. Voiced understanding of Xray results.

## 2013-10-04 NOTE — Telephone Encounter (Signed)
Message copied by Brien Few on Fri Oct 04, 2013  2:34 PM ------      Message from: [name] B      Created: Thu Oct 03, 2013  5:57 PM       Please call patient, left humerus xray is negative ------

## 2013-11-12 ENCOUNTER — Other Ambulatory Visit: Payer: Self-pay | Admitting: *Deleted

## 2013-11-12 NOTE — Telephone Encounter (Signed)
THIS REFILL REQUEST FOR AFINITOR WAS GIVEN TO DR.SHERRILL'S NURSE, SUSAN COWARD,RN.

## 2013-11-13 ENCOUNTER — Other Ambulatory Visit: Payer: Self-pay | Admitting: *Deleted

## 2013-11-13 DIAGNOSIS — C50919 Malignant neoplasm of unspecified site of unspecified female breast: Secondary | ICD-10-CM

## 2013-11-13 MED ORDER — EVEROLIMUS 10 MG PO TABS: 10.0000 mg | ORAL_TABLET | Freq: Every day | ORAL | Status: DC

## 2013-11-18 ENCOUNTER — Ambulatory Visit (HOSPITAL_COMMUNITY)
Admission: RE | Admit: 2013-11-18 | Discharge: 2013-11-18 | Disposition: A | Discharge status: Home / Self Care | Payer: Commercial Managed Care - PPO | Source: Ambulatory Visit | Attending: Oncology | Admitting: Oncology

## 2013-11-18 DIAGNOSIS — C50919 Malignant neoplasm of unspecified site of unspecified female breast: Secondary | ICD-10-CM | POA: Insufficient documentation

## 2013-11-18 DIAGNOSIS — K7689 Other specified diseases of liver: Secondary | ICD-10-CM | POA: Insufficient documentation

## 2013-11-18 DIAGNOSIS — C7951 Secondary malignant neoplasm of bone: Secondary | ICD-10-CM

## 2013-11-18 LAB — GLUCOSE, CAPILLARY: GLUCOSE-CAPILLARY: 93 mg/dL (ref 70–99)

## 2013-11-18 MED ORDER — FLUDEOXYGLUCOSE F - 18 (FDG) INJECTION
8.4000 | Freq: Once | INTRAVENOUS | Status: AC | PRN
  Administered 2013-11-18: 8.4 via INTRAVENOUS

## 2013-11-21 ENCOUNTER — Ambulatory Visit (HOSPITAL_BASED_OUTPATIENT_CLINIC_OR_DEPARTMENT_OTHER): Payer: Commercial Managed Care - PPO | Admitting: Oncology

## 2013-11-21 ENCOUNTER — Other Ambulatory Visit (HOSPITAL_BASED_OUTPATIENT_CLINIC_OR_DEPARTMENT_OTHER): Payer: Commercial Managed Care - PPO

## 2013-11-21 ENCOUNTER — Ambulatory Visit (HOSPITAL_BASED_OUTPATIENT_CLINIC_OR_DEPARTMENT_OTHER): Payer: Commercial Managed Care - PPO

## 2013-11-21 ENCOUNTER — Telehealth: Payer: Self-pay | Admitting: Oncology

## 2013-11-21 ENCOUNTER — Other Ambulatory Visit: Payer: Self-pay | Admitting: *Deleted

## 2013-11-21 VITALS — BP 125/70 | HR 85 | Temp 98.4°F | Resp 18 | Ht 65.0 in | Wt 165.9 lb

## 2013-11-21 DIAGNOSIS — C50919 Malignant neoplasm of unspecified site of unspecified female breast: Secondary | ICD-10-CM

## 2013-11-21 DIAGNOSIS — C7951 Secondary malignant neoplasm of bone: Secondary | ICD-10-CM

## 2013-11-21 DIAGNOSIS — D72819 Decreased white blood cell count, unspecified: Secondary | ICD-10-CM

## 2013-11-21 DIAGNOSIS — R21 Rash and other nonspecific skin eruption: Secondary | ICD-10-CM

## 2013-11-21 DIAGNOSIS — D649 Anemia, unspecified: Secondary | ICD-10-CM

## 2013-11-21 DIAGNOSIS — C7952 Secondary malignant neoplasm of bone marrow: Secondary | ICD-10-CM

## 2013-11-21 DIAGNOSIS — R7989 Other specified abnormal findings of blood chemistry: Secondary | ICD-10-CM

## 2013-11-21 DIAGNOSIS — Z9889 Other specified postprocedural states: Secondary | ICD-10-CM

## 2013-11-21 DIAGNOSIS — M549 Dorsalgia, unspecified: Secondary | ICD-10-CM

## 2013-11-21 LAB — COMPREHENSIVE METABOLIC PANEL (CC13)
ALT: 75 U/L — ABNORMAL HIGH (ref 0–55)
AST: 56 U/L — ABNORMAL HIGH (ref 5–34)
Albumin: 3.9 g/dL (ref 3.5–5.0)
Alkaline Phosphatase: 88 U/L (ref 40–150)
Anion Gap: 9 mEq/L (ref 3–11)
BILIRUBIN TOTAL: 0.39 mg/dL (ref 0.20–1.20)
BUN: 15.6 mg/dL (ref 7.0–26.0)
CALCIUM: 9.7 mg/dL (ref 8.4–10.4)
CHLORIDE: 110 meq/L — AB (ref 98–109)
CO2: 25 mEq/L (ref 22–29)
CREATININE: 0.7 mg/dL (ref 0.6–1.1)
GLUCOSE: 97 mg/dL (ref 70–140)
Potassium: 4.1 mEq/L (ref 3.5–5.1)
Sodium: 143 mEq/L (ref 136–145)
TOTAL PROTEIN: 7.5 g/dL (ref 6.4–8.3)

## 2013-11-21 LAB — CBC WITH DIFFERENTIAL/PLATELET
BASO%: 0.6 % (ref 0.0–2.0)
Basophils Absolute: 0 10*3/uL (ref 0.0–0.1)
EOS ABS: 0.1 10*3/uL (ref 0.0–0.5)
EOS%: 2.3 % (ref 0.0–7.0)
HEMATOCRIT: 28.8 % — AB (ref 34.8–46.6)
HGB: 9.7 g/dL — ABNORMAL LOW (ref 11.6–15.9)
LYMPH#: 0.5 10*3/uL — AB (ref 0.9–3.3)
LYMPH%: 20.6 % (ref 14.0–49.7)
MCH: 27.7 pg (ref 25.1–34.0)
MCHC: 33.6 g/dL (ref 31.5–36.0)
MCV: 82.3 fL (ref 79.5–101.0)
MONO#: 0.2 10*3/uL (ref 0.1–0.9)
MONO%: 8.8 % (ref 0.0–14.0)
NEUT%: 67.7 % (ref 38.4–76.8)
NEUTROS ABS: 1.8 10*3/uL (ref 1.5–6.5)
PLATELETS: 203 10*3/uL (ref 145–400)
RBC: 3.5 10*6/uL — ABNORMAL LOW (ref 3.70–5.45)
RDW: 18.1 % — ABNORMAL HIGH (ref 11.2–14.5)
WBC: 2.6 10*3/uL — AB (ref 3.9–10.3)

## 2013-11-21 LAB — TECHNOLOGIST REVIEW

## 2013-11-21 MED ORDER — DENOSUMAB 120 MG/1.7ML ~~LOC~~ SOLN
120.0000 mg | Freq: Once | SUBCUTANEOUS | Status: AC
  Administered 2013-11-21: 120 mg via SUBCUTANEOUS
  Filled 2013-11-21: qty 1.7

## 2013-11-21 MED ORDER — HYDROCODONE-ACETAMINOPHEN 5-325 MG PO TABS: 1.0000 | ORAL_TABLET | Freq: Four times a day (QID) | ORAL | Status: DC | PRN

## 2013-11-21 MED ORDER — EXEMESTANE 25 MG PO TABS: 25.0000 mg | ORAL_TABLET | Freq: Every day | ORAL | Status: DC

## 2013-11-21 NOTE — Addendum Note (Signed)
Addended by: Tania Ade on: 11/21/2013 03:24 PM   Modules accepted: Orders

## 2013-11-21 NOTE — Telephone Encounter (Signed)
Patient called to report [facility] has not received the exemestane script yet and they gave her OK to get #30 day supply from local pharmacy until they can process her refill-taking 8-10 days for this. Made her aware that the refill was just faxed 30 minutes ago or less-probably has not yet had time to process.

## 2013-11-21 NOTE — Telephone Encounter (Signed)
GAve pt appt for lab,injection and MD for June 2015

## 2013-11-21 NOTE — Telephone Encounter (Signed)
Jansen : [phone] and confirmed that the Afinitor script was received and they will be calling her tomorrow 11/22/13 to arrange for delivery. Script for Exemestane faxed to [facility] to fax # provided by patient (quicker processing) with the patient information form she completed.

## 2013-11-21 NOTE — Progress Notes (Signed)
   Stacy Horn    OFFICE PROGRESS NOTE   INTERVAL HISTORY:   She metastatic breast cancer. She continues Aromasin and afinitor. No mouth sores. Mild rash over the face. She has noted pain in the right side of the upper back for the past few days. No severe pain. No other complaint.  Objective:  Vital signs in last 24 hours:  Blood pressure 125/70, pulse 85, temperature 98.4 F (36.9 C), temperature source Oral, resp. rate 18, height $RemoveBe'5\' 5"'nSmYujiUt$  (1.651 m), weight 165 lb 14.4 oz (75.252 kg), SpO2 100.00%.    HEENT: No thrush or ulcers Lymphatics: No axillary nodes Resp: Lungs clear bilaterally Cardio: Regular rate and rhythm GI: No hepatomegaly Vascular: No leg edema Musculoskeletal: No spine tenderness. Exam of the right upper back is unremarkable  Skin: Few acne type lesions over the chest    Lab Results:  Lab Results  Component Value Date   WBC 2.6* 11/21/2013   HGB 9.7* 11/21/2013   HCT 28.8* 11/21/2013   MCV 82.3 11/21/2013   PLT 203 11/21/2013   NEUTROABS 1.8 11/21/2013   AST 56, ALT 75, bilirubin 0.39  X-rays: PET scan 11/18/2013, compared to 04/08/2013: Interval decrease in diffuse hypermetabolic bone activity compared to the previous study. Single focus of increased FDG activity at the left iliac. No evidence for soft metastases.   Medications: I have reviewed the patient's current medications.  Assessment/Plan: 1.Multicentric invasive lobular carcinoma of the right breast diagnosed in December of 1999, a right mastectomy followed by adjuvant [facility] chemotherapy, 5 years of tamoxifen, and 5 years of Femara. The Femara was completed in June of 2010. The right breast mass excision in January 2000 was ER positive, PR positive, and HER-2 negative  2. Pain at the left groin area with a CT of the pelvis on 02/07/12 confirming a destructive lytic lesion at the left pubic symphysis, status post a CT-guided biopsy on 02/16/12 confirming metastatic carcinoma, focally ER positive  .  -PET scan 03/07/2012 confirmed multiple hypermetabolic bone metastases with no other evidence of metastatic disease  -Status post palliative radiation to the pelvis beginning on 03/12/2012  -Initiation of Arimidex on 03/27/2012 -initiation of every three-month xgeva on 04/04/2012  -PET scan September 14 [id number] new hypermetabolic bone lesions  -Initiation of Faslodex 09/27/2012  -Restaging PET scan 04/08/2013 with evidence of disease progression in the bones  -Initiation of Aromasin/afinitor 05/29/2013  -Iliac biopsy at [facility] 05/22/2013 confirming HER-2 negative metastatic breast  -PET scan 11/18/2013 with a decrease in hypermetabolic activity 3. BRCA1 variant felt to be a polymorphism  4. Pain/tenderness at the left low anterolateral chest wall-she completed palliative radiation on 05/28/2012 with improvement in the pain. She was also treated with radiation to the thoracic spine.  5. Anemia/leukopenia-likely secondary to breast cancer involving the bone marrow and radiation /afinitor -stable  6. Elevated liver enzymes-? Related to afinitor,? Secondary to metastatic breast cancer -stable    Disposition:  I reviewed the PET images with Stacy Horn and her husband. There has been significant improvement in the hypermetabolic bone disease compared to the PET scan from July of 2014. The plan is to continue exemestane and afinitor. She continues xgeva every 3 months. She will return for an office visit in 3 months.   Stacy Coder, MD  11/21/2013  9:21 AM

## 2013-11-26 ENCOUNTER — Other Ambulatory Visit: Payer: Self-pay | Admitting: *Deleted

## 2013-11-26 DIAGNOSIS — C50919 Malignant neoplasm of unspecified site of unspecified female breast: Secondary | ICD-10-CM

## 2013-11-26 MED ORDER — EXEMESTANE 25 MG PO TABS: 25.0000 mg | ORAL_TABLET | Freq: Every day | ORAL | Status: DC

## 2014-01-16 ENCOUNTER — Other Ambulatory Visit: Payer: Self-pay | Admitting: *Deleted

## 2014-01-16 MED ORDER — HYDROCODONE-ACETAMINOPHEN 5-325 MG PO TABS: 1.0000 | ORAL_TABLET | Freq: Four times a day (QID) | ORAL | Status: DC | PRN

## 2014-01-16 NOTE — Telephone Encounter (Signed)
Message from pt requesting refill of Hydrocodone. Rx will be left in prescription book for pick up.

## 2014-02-13 ENCOUNTER — Encounter: Payer: Self-pay | Admitting: Oncology

## 2014-02-13 NOTE — Progress Notes (Signed)
his claim was paid by [facility] on 01/31/14, once payment date is 45 days out I can give the eob to payment posting to post to the patients account Per billing in reference to [id number] still pending.

## 2014-02-20 ENCOUNTER — Telehealth: Payer: Self-pay | Admitting: Oncology

## 2014-02-20 ENCOUNTER — Other Ambulatory Visit (HOSPITAL_BASED_OUTPATIENT_CLINIC_OR_DEPARTMENT_OTHER): Payer: Commercial Managed Care - PPO

## 2014-02-20 ENCOUNTER — Ambulatory Visit (HOSPITAL_BASED_OUTPATIENT_CLINIC_OR_DEPARTMENT_OTHER): Payer: Commercial Managed Care - PPO

## 2014-02-20 ENCOUNTER — Ambulatory Visit (HOSPITAL_BASED_OUTPATIENT_CLINIC_OR_DEPARTMENT_OTHER): Payer: Commercial Managed Care - PPO | Admitting: Oncology

## 2014-02-20 VITALS — BP 135/73 | HR 78 | Temp 97.8°F | Resp 18 | Ht 65.0 in | Wt 160.4 lb

## 2014-02-20 DIAGNOSIS — R7989 Other specified abnormal findings of blood chemistry: Secondary | ICD-10-CM

## 2014-02-20 DIAGNOSIS — C50919 Malignant neoplasm of unspecified site of unspecified female breast: Secondary | ICD-10-CM

## 2014-02-20 DIAGNOSIS — C7951 Secondary malignant neoplasm of bone: Secondary | ICD-10-CM

## 2014-02-20 DIAGNOSIS — Z9889 Other specified postprocedural states: Secondary | ICD-10-CM

## 2014-02-20 DIAGNOSIS — C7952 Secondary malignant neoplasm of bone marrow: Secondary | ICD-10-CM

## 2014-02-20 DIAGNOSIS — D72819 Decreased white blood cell count, unspecified: Secondary | ICD-10-CM

## 2014-02-20 DIAGNOSIS — D649 Anemia, unspecified: Secondary | ICD-10-CM

## 2014-02-20 LAB — CBC WITH DIFFERENTIAL/PLATELET
BASO%: 1 % (ref 0.0–2.0)
BASOS ABS: 0 10*3/uL (ref 0.0–0.1)
EOS%: 2.2 % (ref 0.0–7.0)
Eosinophils Absolute: 0.1 10*3/uL (ref 0.0–0.5)
HCT: 34.5 % — ABNORMAL LOW (ref 34.8–46.6)
HEMOGLOBIN: 11.4 g/dL — AB (ref 11.6–15.9)
LYMPH#: 0.6 10*3/uL — AB (ref 0.9–3.3)
LYMPH%: 18.6 % (ref 14.0–49.7)
MCH: 26.5 pg (ref 25.1–34.0)
MCHC: 33 g/dL (ref 31.5–36.0)
MCV: 80.3 fL (ref 79.5–101.0)
MONO#: 0.2 10*3/uL (ref 0.1–0.9)
MONO%: 6.9 % (ref 0.0–14.0)
NEUT#: 2.4 10*3/uL (ref 1.5–6.5)
NEUT%: 71.3 % (ref 38.4–76.8)
Platelets: 255 10*3/uL (ref 145–400)
RBC: 4.3 10*6/uL (ref 3.70–5.45)
RDW: 16.4 % — AB (ref 11.2–14.5)
WBC: 3.3 10*3/uL — ABNORMAL LOW (ref 3.9–10.3)

## 2014-02-20 LAB — COMPREHENSIVE METABOLIC PANEL (CC13)
ALK PHOS: 89 U/L (ref 40–150)
ALT: 82 U/L — ABNORMAL HIGH (ref 0–55)
AST: 57 U/L — AB (ref 5–34)
Albumin: 3.8 g/dL (ref 3.5–5.0)
Anion Gap: 14 mEq/L — ABNORMAL HIGH (ref 3–11)
BUN: 12.9 mg/dL (ref 7.0–26.0)
CALCIUM: 9.8 mg/dL (ref 8.4–10.4)
CHLORIDE: 108 meq/L (ref 98–109)
CO2: 22 mEq/L (ref 22–29)
Creatinine: 0.8 mg/dL (ref 0.6–1.1)
Glucose: 95 mg/dl (ref 70–140)
POTASSIUM: 4.1 meq/L (ref 3.5–5.1)
SODIUM: 144 meq/L (ref 136–145)
TOTAL PROTEIN: 7.7 g/dL (ref 6.4–8.3)
Total Bilirubin: 0.42 mg/dL (ref 0.20–1.20)

## 2014-02-20 MED ORDER — DENOSUMAB 120 MG/1.7ML ~~LOC~~ SOLN
120.0000 mg | Freq: Once | SUBCUTANEOUS | Status: AC
  Administered 2014-02-20: 120 mg via SUBCUTANEOUS
  Filled 2014-02-20: qty 1.7

## 2014-02-20 MED ORDER — HYDROCODONE-ACETAMINOPHEN 5-325 MG PO TABS: 1.0000 | ORAL_TABLET | Freq: Four times a day (QID) | ORAL | Status: DC | PRN

## 2014-02-20 NOTE — Telephone Encounter (Signed)
gv adn printed appt sched and avs for pt for SEpt °

## 2014-02-20 NOTE — Progress Notes (Signed)
  [place] OFFICE PROGRESS NOTE   Diagnosis: Breast cancer  INTERVAL HISTORY:   She returns as scheduled. She continues Aromasin and Afinitor. She reports pain at the bilateral but not with sitting. This has been present for the past month. No other pain. Good appetite. She has a "infection "at the tip of one of her fingers from a splinter. She was started on antibiotic by her primary physician. She currently has a "sinus "an upper respiratory infection. Her daughter had a similar infection.  Objective:  Vital signs in last 24 hours:  Blood pressure 135/73, pulse 78, temperature 97.8 F (36.6 C), temperature source Oral, resp. rate 18, height $RemoveBe'5\' 5"'hgCKoLVkk$  (1.651 m), weight 160 lb 6.4 oz (72.757 kg), SpO2 98.00%.    HEENT: Neck without mass, no thrush or ulcers, mild erythema at the pharynx Resp: Lungs clear bilaterally Cardio: Regular rate and rhythm GI: No hepatosplenomegaly Vascular: No leg edema Breast: Status post bilateral mastectomy with implants in place. No chest wall nodules. Skin: Few acne type lesions over the face and upper chest  Musculoskeletal: No tenderness at the ischium bilaterally  Lab Results:  Lab Results  Component Value Date   WBC 3.3* 02/20/2014   HGB 11.4* 02/20/2014   HCT 34.5* 02/20/2014   MCV 80.3 02/20/2014   PLT 255 02/20/2014   NEUTROABS 2.4 02/20/2014    Medications: I have reviewed the patient's current medications.  Assessment/Plan: 1.Multicentric invasive lobular carcinoma of the right breast diagnosed in December of 1999, a right mastectomy followed by adjuvant [facility] chemotherapy, 5 years of tamoxifen, and 5 years of Femara. The Femara was completed in June of 2010. The right breast mass excision in January 2000 was ER positive, PR positive, and HER-2 negative  2. Pain at the left groin area with a CT of the pelvis on 02/07/12 confirming a destructive lytic lesion at the left pubic symphysis, status post a CT-guided biopsy on 02/16/12 confirming  metastatic carcinoma, focally ER positive .  -PET scan 03/07/2012 confirmed multiple hypermetabolic bone metastases with no other evidence of metastatic disease  -Status post palliative radiation to the pelvis beginning on 03/12/2012  -Initiation of Arimidex on 03/27/2012 -initiation of every three-month xgeva on 04/04/2012  -PET scan September 14 [id number] new hypermetabolic bone lesions  -Initiation of Faslodex 09/27/2012  -Restaging PET scan 04/08/2013 with evidence of disease progression in the bones  -Initiation of Aromasin/afinitor 05/29/2013  -Iliac biopsy at [facility] 05/22/2013 confirming HER-2 negative metastatic breast  -PET scan 11/18/2013 with a decrease in hypermetabolic activity  3. BRCA1 variant felt to be a polymorphism  4. Pain/tenderness at the left low anterolateral chest wall-she completed palliative radiation on 05/28/2012 with improvement in the pain. She was also treated with radiation to the thoracic spine.  5. Anemia/leukopenia-likely secondary to breast cancer involving the bone marrow and radiation /afinitor -the anemia is improved 6. Elevated liver enzymes-? Related to afinitor,? Secondary to metastatic breast cancer -chemistry panel pending today   Disposition:  Her overall status appears unchanged. The plan is to continue Aromasin and Afinitor. She will return for an office visit in 3 months. She will contact us in the interim for increased pain at the ischium and we will arrange for an x-ray evaluation. She continues every three-month xgeva.  Ladell Pier, MD  02/20/2014  9:10 AM

## 2014-04-23 ENCOUNTER — Other Ambulatory Visit: Payer: Self-pay | Admitting: *Deleted

## 2014-04-23 MED ORDER — HYDROCODONE-ACETAMINOPHEN 5-325 MG PO TABS: 1.0000 | ORAL_TABLET | Freq: Four times a day (QID) | ORAL | Status: DC | PRN

## 2014-04-23 NOTE — Telephone Encounter (Signed)
Message from pt requesting refill on Hydrocodone. Rx will be left in prescription book for pick up.  

## 2014-05-29 ENCOUNTER — Ambulatory Visit (HOSPITAL_BASED_OUTPATIENT_CLINIC_OR_DEPARTMENT_OTHER): Payer: Commercial Managed Care - PPO | Admitting: Oncology

## 2014-05-29 ENCOUNTER — Telehealth: Payer: Self-pay | Admitting: Oncology

## 2014-05-29 ENCOUNTER — Ambulatory Visit (HOSPITAL_BASED_OUTPATIENT_CLINIC_OR_DEPARTMENT_OTHER): Payer: Commercial Managed Care - PPO

## 2014-05-29 ENCOUNTER — Other Ambulatory Visit (HOSPITAL_BASED_OUTPATIENT_CLINIC_OR_DEPARTMENT_OTHER): Payer: Commercial Managed Care - PPO

## 2014-05-29 VITALS — BP 127/68 | HR 61 | Temp 98.0°F | Resp 19 | Ht 65.0 in | Wt 160.2 lb

## 2014-05-29 DIAGNOSIS — D649 Anemia, unspecified: Secondary | ICD-10-CM

## 2014-05-29 DIAGNOSIS — Z9889 Other specified postprocedural states: Secondary | ICD-10-CM

## 2014-05-29 DIAGNOSIS — R748 Abnormal levels of other serum enzymes: Secondary | ICD-10-CM

## 2014-05-29 DIAGNOSIS — C7951 Secondary malignant neoplasm of bone: Secondary | ICD-10-CM

## 2014-05-29 DIAGNOSIS — C50911 Malignant neoplasm of unspecified site of right female breast: Secondary | ICD-10-CM

## 2014-05-29 DIAGNOSIS — Z23 Encounter for immunization: Secondary | ICD-10-CM

## 2014-05-29 DIAGNOSIS — D72819 Decreased white blood cell count, unspecified: Secondary | ICD-10-CM

## 2014-05-29 DIAGNOSIS — C7952 Secondary malignant neoplasm of bone marrow: Secondary | ICD-10-CM

## 2014-05-29 DIAGNOSIS — C50919 Malignant neoplasm of unspecified site of unspecified female breast: Secondary | ICD-10-CM

## 2014-05-29 LAB — COMPREHENSIVE METABOLIC PANEL (CC13)
ALK PHOS: 67 U/L (ref 40–150)
ALT: 76 U/L — ABNORMAL HIGH (ref 0–55)
AST: 54 U/L — AB (ref 5–34)
Albumin: 3.8 g/dL (ref 3.5–5.0)
Anion Gap: 8 mEq/L (ref 3–11)
BILIRUBIN TOTAL: 0.48 mg/dL (ref 0.20–1.20)
BUN: 11.4 mg/dL (ref 7.0–26.0)
CO2: 27 mEq/L (ref 22–29)
Calcium: 9.2 mg/dL (ref 8.4–10.4)
Chloride: 110 mEq/L — ABNORMAL HIGH (ref 98–109)
Creatinine: 0.8 mg/dL (ref 0.6–1.1)
GLUCOSE: 94 mg/dL (ref 70–140)
Potassium: 4 mEq/L (ref 3.5–5.1)
Sodium: 145 mEq/L (ref 136–145)
Total Protein: 7.3 g/dL (ref 6.4–8.3)

## 2014-05-29 LAB — CBC WITH DIFFERENTIAL/PLATELET
BASO%: 0.5 % (ref 0.0–2.0)
Basophils Absolute: 0 10*3/uL (ref 0.0–0.1)
EOS ABS: 0 10*3/uL (ref 0.0–0.5)
EOS%: 1.2 % (ref 0.0–7.0)
HCT: 34.8 % (ref 34.8–46.6)
HGB: 11.6 g/dL (ref 11.6–15.9)
LYMPH%: 21.8 % (ref 14.0–49.7)
MCH: 27.2 pg (ref 25.1–34.0)
MCHC: 33.3 g/dL (ref 31.5–36.0)
MCV: 81.5 fL (ref 79.5–101.0)
MONO#: 0.2 10*3/uL (ref 0.1–0.9)
MONO%: 6.6 % (ref 0.0–14.0)
NEUT%: 69.9 % (ref 38.4–76.8)
NEUTROS ABS: 2.2 10*3/uL (ref 1.5–6.5)
PLATELETS: 218 10*3/uL (ref 145–400)
RBC: 4.27 10*6/uL (ref 3.70–5.45)
RDW: 15.7 % — ABNORMAL HIGH (ref 11.2–14.5)
WBC: 3.1 10*3/uL — ABNORMAL LOW (ref 3.9–10.3)
lymph#: 0.7 10*3/uL — ABNORMAL LOW (ref 0.9–3.3)

## 2014-05-29 LAB — LIPID PANEL
CHOLESTEROL: 224 mg/dL — AB (ref 0–200)
HDL: 40 mg/dL (ref >39)
LDL CALC: 140 mg/dL — AB (ref 0–99)
TRIGLYCERIDES: 220 mg/dL — AB (ref <150)
Total CHOL/HDL Ratio: 5.6 Ratio
VLDL: 44 mg/dL — AB (ref 0–40)

## 2014-05-29 MED ORDER — INFLUENZA VAC SPLIT QUAD 0.5 ML IM SUSY
0.5000 mL | PREFILLED_SYRINGE | Freq: Once | INTRAMUSCULAR | Status: AC
  Administered 2014-05-29: 0.5 mL via INTRAMUSCULAR
  Filled 2014-05-29: qty 0.5

## 2014-05-29 MED ORDER — DENOSUMAB 120 MG/1.7ML ~~LOC~~ SOLN
120.0000 mg | Freq: Once | SUBCUTANEOUS | Status: AC
Start: 2014-05-29 — End: 2014-05-29
  Administered 2014-05-29: 120 mg via SUBCUTANEOUS
  Filled 2014-05-29: qty 1.7

## 2014-05-29 MED ORDER — HYDROCODONE-ACETAMINOPHEN 5-325 MG PO TABS: 1.0000 | ORAL_TABLET | Freq: Four times a day (QID) | ORAL | Status: DC | PRN

## 2014-05-29 NOTE — Progress Notes (Signed)
  [place] OFFICE PROGRESS NOTE   Diagnosis: Breast cancer  INTERVAL HISTORY:   She returns as scheduled. She continues Aromasin and afinitor. Small sore at the lower inner lip. No other mouth ulcers. The ischium pain is better. She takes 1 hydrocodone in the evening for pelvic pain and occasionally takes another hydrocodone after doing heavy work with her arms. No new complaint.  Objective:  Vital signs in last 24 hours:  Blood pressure 127/68, pulse 61, temperature 98 F (36.7 C), temperature source Oral, resp. rate 19, height $RemoveBe'5\' 5"'LVIIgzOKN$  (1.651 m), weight 160 lb 3.2 oz (72.666 kg), SpO2 100.00%.    HEENT: 2-3 mm erythematous area at the right lower inner lip. No ulcers or thrush. Lymphatics: 1/2 cm mobile left axillary node. No cervical, supraclavicular, or right axillary nodes Resp: Lungs clear bilaterally Cardio: Regular rate and rhythm GI: No hepatosplenomegaly Vascular: No leg edema Musculoskeletal: No pain with motion at the hips, no tenderness at the ischium bilaterally Breasts: Status post bilateral mastectomy with implants. No evidence for chest wall tumor recurrence.   Lab Results:  Lab Results  Component Value Date   WBC 3.1* 05/29/2014   HGB 11.6 05/29/2014   HCT 34.8 05/29/2014   MCV 81.5 05/29/2014   PLT 218 05/29/2014   NEUTROABS 2.2 05/29/2014     Medications: I have reviewed the patient's current medications.  Assessment/Plan: 1.Multicentric invasive lobular carcinoma of the right breast diagnosed in December of 1999, a right mastectomy followed by adjuvant [facility] chemotherapy, 5 years of tamoxifen, and 5 years of Femara. The Femara was completed in June of 2010. The right breast mass excision in January 2000 was ER positive, PR positive, and HER-2 negative  2. Pain at the left groin area with a CT of the pelvis on 02/07/12 confirming a destructive lytic lesion at the left pubic symphysis, status post a CT-guided biopsy on 02/16/12 confirming metastatic  carcinoma, focally ER positive .  -PET scan 03/07/2012 confirmed multiple hypermetabolic bone metastases with no other evidence of metastatic disease  -Status post palliative radiation to the pelvis beginning on 03/12/2012  -Initiation of Arimidex on 03/27/2012 -initiation of every three-month xgeva on 04/04/2012  -PET scan September 14 [id number] new hypermetabolic bone lesions  -Initiation of Faslodex 09/27/2012  -Restaging PET scan 04/08/2013 with evidence of disease progression in the bones  -Initiation of Aromasin/afinitor 05/29/2013  -Iliac biopsy at [facility] 05/22/2013 confirming HER-2 negative metastatic breast  -PET scan 11/18/2013 with a decrease in hypermetabolic activity  3. BRCA1 variant felt to be a polymorphism  4. Pain/tenderness at the left low anterolateral chest wall-she completed palliative radiation on 05/28/2012 with improvement in the pain. She was also treated with radiation to the thoracic spine.  5. Anemia/leukopenia-likely secondary to breast cancer involving the bone marrow and radiation /afinitor -the anemia is improved  6. Elevated liver enzymes-? Related to afinitor,? Secondary to metastatic breast cancer -chemistry panel pending today     Disposition:  She appears stable. She continues every three-month xgeva. She will continue Aromasin and Afinitor. Ms. Ikner would like to wait until early next year to complete the next restaging PET scan secondary to insurance issues. She will return for an office and lab visit in 3 months.  [name], MD  05/29/2014  9:13 AM

## 2014-05-29 NOTE — Telephone Encounter (Signed)
Pt confirmed labs/ov per 09/10 POF, gave pt AVS....KJ °

## 2014-07-04 ENCOUNTER — Other Ambulatory Visit: Payer: Self-pay

## 2014-08-06 ENCOUNTER — Other Ambulatory Visit: Payer: Self-pay | Admitting: *Deleted

## 2014-08-06 NOTE — Telephone Encounter (Signed)
THIS REFILL REQUEST FOR AFINITOR WAS PLACED ON DR.SHERRILL'S DESK.

## 2014-08-07 ENCOUNTER — Other Ambulatory Visit: Payer: Self-pay

## 2014-08-07 ENCOUNTER — Other Ambulatory Visit: Payer: Self-pay | Admitting: *Deleted

## 2014-08-07 DIAGNOSIS — C50911 Malignant neoplasm of unspecified site of right female breast: Secondary | ICD-10-CM

## 2014-08-07 MED ORDER — EVEROLIMUS 10 MG PO TABS: 10.0000 mg | ORAL_TABLET | Freq: Every day | ORAL | Status: DC

## 2014-08-08 ENCOUNTER — Encounter: Payer: Self-pay | Admitting: Oncology

## 2014-08-11 ENCOUNTER — Other Ambulatory Visit: Payer: Self-pay | Admitting: *Deleted

## 2014-08-11 ENCOUNTER — Encounter: Payer: Self-pay | Admitting: Oncology

## 2014-08-11 DIAGNOSIS — C50919 Malignant neoplasm of unspecified site of unspecified female breast: Secondary | ICD-10-CM

## 2014-08-11 DIAGNOSIS — C50911 Malignant neoplasm of unspecified site of right female breast: Secondary | ICD-10-CM

## 2014-08-11 DIAGNOSIS — C7951 Secondary malignant neoplasm of bone: Principal | ICD-10-CM

## 2014-08-11 MED ORDER — HYDROCODONE-ACETAMINOPHEN 5-325 MG PO TABS: 1.0000 | ORAL_TABLET | Freq: Four times a day (QID) | ORAL | Status: DC | PRN

## 2014-08-11 MED ORDER — EXEMESTANE 25 MG PO TABS: 25.0000 mg | ORAL_TABLET | Freq: Every day | ORAL | Status: DC

## 2014-08-11 MED ORDER — EVEROLIMUS 10 MG PO TABS: 10.0000 mg | ORAL_TABLET | Freq: Every day | ORAL | Status: DC

## 2014-08-11 NOTE — Telephone Encounter (Signed)
Notified pt that pain med re-fill request is ready for pick-up @ her convenience.  Pt verbalized understanding and expressed appreciation for call back.

## 2014-08-11 NOTE — Progress Notes (Signed)
Faxed afinitor pa form to [facility] °

## 2014-08-12 ENCOUNTER — Encounter: Payer: Self-pay | Admitting: *Deleted

## 2014-08-13 ENCOUNTER — Encounter: Payer: Self-pay | Admitting: Oncology

## 2014-08-13 NOTE — Progress Notes (Signed)
Faxed aromasin and afinitor prescriptions to [facility], [facility] cannot fill them.

## 2014-08-13 NOTE — Progress Notes (Signed)
[facility] approved afinitor from 08/11/14-08/12/15

## 2014-08-18 NOTE — Telephone Encounter (Signed)
RECEIVED A FAX FROM [facility] CONCERNING A CONFIRMATION OF FACSIMILE RECEIPT FOR PT. REFERRAL. 

## 2014-08-19 ENCOUNTER — Telehealth: Payer: Self-pay | Admitting: *Deleted

## 2014-08-19 NOTE — Telephone Encounter (Signed)
Message from pt asking why her Afinitor and Exemestane Rx was sent to [facility]? She spoke with her insurance company and was told it could go anywhere. Informed her that [facility] could not fill Rx. Her only concern is that they will only ship one month at a time. She will call pharmacy to deliver this shipment. She will let us know if she prefers a different pharmacy in the future.

## 2014-08-25 ENCOUNTER — Encounter: Payer: Self-pay | Admitting: *Deleted

## 2014-08-25 NOTE — Progress Notes (Signed)
RECEIVED A FAX FROM [facility] CONCERNING A CONFIRMATION OF PRESCRIPTION SHIPMENT FOR AFINITOR ON 08/22/14.

## 2014-08-26 ENCOUNTER — Other Ambulatory Visit: Payer: Self-pay | Admitting: *Deleted

## 2014-08-26 DIAGNOSIS — C50919 Malignant neoplasm of unspecified site of unspecified female breast: Secondary | ICD-10-CM

## 2014-08-26 DIAGNOSIS — C7951 Secondary malignant neoplasm of bone: Secondary | ICD-10-CM

## 2014-08-26 MED ORDER — EXEMESTANE 25 MG PO TABS: 25.0000 mg | ORAL_TABLET | Freq: Every day | ORAL | Status: DC

## 2014-08-28 ENCOUNTER — Ambulatory Visit (HOSPITAL_BASED_OUTPATIENT_CLINIC_OR_DEPARTMENT_OTHER): Payer: 59 | Admitting: Oncology

## 2014-08-28 ENCOUNTER — Telehealth: Payer: Self-pay | Admitting: Oncology

## 2014-08-28 ENCOUNTER — Other Ambulatory Visit (HOSPITAL_BASED_OUTPATIENT_CLINIC_OR_DEPARTMENT_OTHER): Payer: 59

## 2014-08-28 ENCOUNTER — Ambulatory Visit (HOSPITAL_BASED_OUTPATIENT_CLINIC_OR_DEPARTMENT_OTHER): Payer: 59

## 2014-08-28 VITALS — BP 130/73 | HR 71 | Temp 97.4°F | Resp 18 | Ht 65.0 in | Wt 158.2 lb

## 2014-08-28 DIAGNOSIS — C50919 Malignant neoplasm of unspecified site of unspecified female breast: Secondary | ICD-10-CM

## 2014-08-28 DIAGNOSIS — C50911 Malignant neoplasm of unspecified site of right female breast: Secondary | ICD-10-CM

## 2014-08-28 DIAGNOSIS — Z9889 Other specified postprocedural states: Secondary | ICD-10-CM

## 2014-08-28 DIAGNOSIS — D72819 Decreased white blood cell count, unspecified: Secondary | ICD-10-CM

## 2014-08-28 DIAGNOSIS — C7951 Secondary malignant neoplasm of bone: Secondary | ICD-10-CM

## 2014-08-28 DIAGNOSIS — Z23 Encounter for immunization: Secondary | ICD-10-CM

## 2014-08-28 DIAGNOSIS — D649 Anemia, unspecified: Secondary | ICD-10-CM

## 2014-08-28 DIAGNOSIS — E8809 Other disorders of plasma-protein metabolism, not elsewhere classified: Secondary | ICD-10-CM

## 2014-08-28 LAB — CBC WITH DIFFERENTIAL/PLATELET
BASO%: 0 % (ref 0.0–2.0)
Basophils Absolute: 0 10*3/uL (ref 0.0–0.1)
EOS%: 1.5 % (ref 0.0–7.0)
Eosinophils Absolute: 0.1 10*3/uL (ref 0.0–0.5)
HCT: 38.1 % (ref 34.8–46.6)
HGB: 12.6 g/dL (ref 11.6–15.9)
LYMPH%: 24.1 % (ref 14.0–49.7)
MCH: 27.5 pg (ref 25.1–34.0)
MCHC: 33.1 g/dL (ref 31.5–36.0)
MCV: 83.2 fL (ref 79.5–101.0)
MONO#: 0.2 10*3/uL (ref 0.1–0.9)
MONO%: 6.7 % (ref 0.0–14.0)
NEUT#: 2.3 10*3/uL (ref 1.5–6.5)
NEUT%: 67.7 % (ref 38.4–76.8)
Platelets: 213 10*3/uL (ref 145–400)
RBC: 4.58 10*6/uL (ref 3.70–5.45)
RDW: 14 % (ref 11.2–14.5)
WBC: 3.4 10*3/uL — ABNORMAL LOW (ref 3.9–10.3)
lymph#: 0.8 10*3/uL — ABNORMAL LOW (ref 0.9–3.3)

## 2014-08-28 LAB — COMPREHENSIVE METABOLIC PANEL (CC13)
ALBUMIN: 4.1 g/dL (ref 3.5–5.0)
ALK PHOS: 80 U/L (ref 40–150)
ALT: 61 U/L — AB (ref 0–55)
AST: 45 U/L — AB (ref 5–34)
Anion Gap: 11 mEq/L (ref 3–11)
BUN: 16.1 mg/dL (ref 7.0–26.0)
CO2: 27 mEq/L (ref 22–29)
Calcium: 9.8 mg/dL (ref 8.4–10.4)
Chloride: 106 mEq/L (ref 98–109)
Creatinine: 0.8 mg/dL (ref 0.6–1.1)
EGFR: 78 mL/min/{1.73_m2} — ABNORMAL LOW (ref >90)
Glucose: 89 mg/dl (ref 70–140)
POTASSIUM: 4 meq/L (ref 3.5–5.1)
SODIUM: 143 meq/L (ref 136–145)
TOTAL PROTEIN: 7.6 g/dL (ref 6.4–8.3)
Total Bilirubin: 0.53 mg/dL (ref 0.20–1.20)

## 2014-08-28 MED ORDER — PNEUMOCOCCAL 13-VAL CONJ VACC IM SUSP
0.5000 mL | Freq: Once | INTRAMUSCULAR | Status: AC
  Administered 2014-08-28: 0.5 mL via INTRAMUSCULAR
  Filled 2014-08-28: qty 0.5

## 2014-08-28 MED ORDER — DENOSUMAB 120 MG/1.7ML ~~LOC~~ SOLN
120.0000 mg | Freq: Once | SUBCUTANEOUS | Status: AC
  Administered 2014-08-28: 120 mg via SUBCUTANEOUS
  Filled 2014-08-28: qty 1.7

## 2014-08-28 NOTE — Progress Notes (Signed)
  [place] OFFICE PROGRESS NOTE   Diagnosis: Breast cancer  INTERVAL HISTORY:   She returns as scheduled. She continues Aromasin and afinitor. No change in the bone pain. Good appetite and energy level. She developed an ulcer at the tip of her tongue after drinking hot coffee. This has healed.  Objective:  Vital signs in last 24 hours:  Blood pressure 130/73, pulse 71, temperature 97.4 F (36.3 C), temperature source Oral, resp. rate 18, height $RemoveBe'5\' 5"'OAwQywKCC$  (1.651 m), weight 158 lb 3.2 oz (71.759 kg).    HEENT: No thrush or ulcers Lymphatics: No cervical or supraclavicular nodes. 1 cm mobile left axillary node. Resp: Lungs clear bilaterally Cardio: Regular rate and rhythm GI: No hepatomegaly Vascular: No leg edema Breasts: Status post bilateral mastectomies with implants in place. No evidence for chest wall tumor recurrence.    Lab Results:  Lab Results  Component Value Date   WBC 3.4* 08/28/2014   HGB 12.6 08/28/2014   HCT 38.1 08/28/2014   MCV 83.2 08/28/2014   PLT 213 08/28/2014   NEUTROABS 2.3 08/28/2014   AST 45, ALT 61, alkaline phosphatase 80, bilirubin 0.53   05/29/2014: HDL 40, LDL 140, cholesterol 224, triglycerides 220   Medications: I have reviewed the patient's current medications.  Assessment/Plan: 1.Multicentric invasive lobular carcinoma of the right breast diagnosed in December of 1999, a right mastectomy followed by adjuvant [facility] chemotherapy, 5 years of tamoxifen, and 5 years of Femara. The Femara was completed in June of 2010. The right breast mass excision in January 2000 was ER positive, PR positive, and HER-2 negative  2. Pain at the left groin area with a CT of the pelvis on 02/07/12 confirming a destructive lytic lesion at the left pubic symphysis, status post a CT-guided biopsy on 02/16/12 confirming metastatic carcinoma, focally ER positive .  -PET scan 03/07/2012 confirmed multiple hypermetabolic bone metastases with no other  evidence of metastatic disease  -Status post palliative radiation to the pelvis beginning on 03/12/2012  -Initiation of Arimidex on 03/27/2012 -initiation of every three-month xgeva on 04/04/2012  -PET scan September 14 [id number] new hypermetabolic bone lesions  -Initiation of Faslodex 09/27/2012  -Restaging PET scan 04/08/2013 with evidence of disease progression in the bones  -Initiation of Aromasin/afinitor 05/29/2013  -Iliac biopsy at [facility] 05/22/2013 confirming HER-2 negative metastatic breast  -PET scan 11/18/2013 with a decrease in hypermetabolic activity  3. BRCA1 variant felt to be a polymorphism  4. Pain/tenderness at the left low anterolateral chest wall-she completed palliative radiation on 05/28/2012 with improvement in the pain. She was also treated with radiation to the thoracic spine.  5. Anemia/leukopenia-likely secondary to breast cancer involving the bone marrow and radiation /afinitor -the anemia is improved  6. Elevated liver enzymes-? Related to afinitor,? Secondary to metastatic breast cancer    Disposition:  She appears stable. The plan is to continue the current therapy. She will receive xgeva today. Ms. Gallo will be scheduled for a restaging PET scan and office visit next month. We will repeat a lipid panel when she returns next month. The abnormal lipid panel may be related to exemestane.  [name], MD  08/28/2014  10:03 AM

## 2014-08-28 NOTE — Patient Instructions (Signed)
Denosumab injection What is this medicine? DENOSUMAB (den oh sue mab) slows bone breakdown. Prolia is used to treat osteoporosis in women after menopause and in men. Xgeva is used to prevent bone fractures and other bone problems caused by cancer bone metastases. Xgeva is also used to treat giant cell tumor of the bone. This medicine may be used for other purposes; ask your health care provider or pharmacist if you have questions. COMMON BRAND NAME(S): Prolia, XGEVA What should I tell my health care provider before I take this medicine? They need to know if you have any of these conditions: -dental disease -eczema -infection or history of infections -kidney disease or on dialysis -low blood calcium or vitamin D -malabsorption syndrome -scheduled to have surgery or tooth extraction -taking medicine that contains denosumab -thyroid or parathyroid disease -an unusual reaction to denosumab, other medicines, foods, dyes, or preservatives -pregnant or trying to get pregnant -breast-feeding How should I use this medicine? This medicine is for injection under the skin. It is given by a health care professional in a hospital or clinic setting. If you are getting Prolia, a special MedGuide will be given to you by the pharmacist with each prescription and refill. Be sure to read this information carefully each time. For Prolia, talk to your pediatrician regarding the use of this medicine in children. Special care may be needed. For Xgeva, talk to your pediatrician regarding the use of this medicine in children. While this drug may be prescribed for children as young as 13 years for selected conditions, precautions do apply. Overdosage: If you think you've taken too much of this medicine contact a poison control center or emergency room at once. Overdosage: If you think you have taken too much of this medicine contact a poison control center or emergency room at once. NOTE: This medicine is only for  you. Do not share this medicine with others. What if I miss a dose? It is important not to miss your dose. Call your doctor or health care professional if you are unable to keep an appointment. What may interact with this medicine? Do not take this medicine with any of the following medications: -other medicines containing denosumab This medicine may also interact with the following medications: -medicines that suppress the immune system -medicines that treat cancer -steroid medicines like prednisone or cortisone This list may not describe all possible interactions. Give your health care provider a list of all the medicines, herbs, non-prescription drugs, or dietary supplements you use. Also tell them if you smoke, drink alcohol, or use illegal drugs. Some items may interact with your medicine. What should I watch for while using this medicine? Visit your doctor or health care professional for regular checks on your progress. Your doctor or health care professional may order blood tests and other tests to see how you are doing. Call your doctor or health care professional if you get a cold or other infection while receiving this medicine. Do not treat yourself. This medicine may decrease your body's ability to fight infection. You should make sure you get enough calcium and vitamin D while you are taking this medicine, unless your doctor tells you not to. Discuss the foods you eat and the vitamins you take with your health care professional. See your dentist regularly. Brush and floss your teeth as directed. Before you have any dental work done, tell your dentist you are receiving this medicine. Do not become pregnant while taking this medicine or for 5 months after stopping   it. Women should inform their doctor if they wish to become pregnant or think they might be pregnant. There is a potential for serious side effects to an unborn child. Talk to your health care professional or pharmacist for more  information. What side effects may I notice from receiving this medicine? Side effects that you should report to your doctor or health care professional as soon as possible: -allergic reactions like skin rash, itching or hives, swelling of the face, lips, or tongue -breathing problems -chest pain -fast, irregular heartbeat -feeling faint or lightheaded, falls -fever, chills, or any other sign of infection -muscle spasms, tightening, or twitches -numbness or tingling -skin blisters or bumps, or is dry, peels, or red -slow healing or unexplained pain in the mouth or jaw -unusual bleeding or bruising Side effects that usually do not require medical attention (Report these to your doctor or health care professional if they continue or are bothersome.): -muscle pain -stomach upset, gas This list may not describe all possible side effects. Call your doctor for medical advice about side effects. You may report side effects to FDA at 1-800-FDA-1088. Where should I keep my medicine? This medicine is only given in a clinic, doctor's office, or other health care setting and will not be stored at home. NOTE: This sheet is a summary. It may not cover all possible information. If you have questions about this medicine, talk to your doctor, pharmacist, or health care provider.  2015, Elsevier/Gold Standard. (2012-03-05 12:37:47)  

## 2014-08-28 NOTE — Telephone Encounter (Signed)
Pt confirmed labs/ov per 12/10 POF, gave pt AVS..... KJ

## 2014-10-08 ENCOUNTER — Ambulatory Visit (HOSPITAL_COMMUNITY)
Admission: RE | Admit: 2014-10-08 | Discharge: 2014-10-08 | Disposition: A | Discharge status: Home / Self Care | Payer: 59 | Source: Ambulatory Visit | Attending: Oncology | Admitting: Oncology

## 2014-10-08 DIAGNOSIS — C7951 Secondary malignant neoplasm of bone: Secondary | ICD-10-CM | POA: Insufficient documentation

## 2014-10-08 DIAGNOSIS — C50919 Malignant neoplasm of unspecified site of unspecified female breast: Secondary | ICD-10-CM | POA: Diagnosis not present

## 2014-10-08 LAB — GLUCOSE, CAPILLARY: GLUCOSE-CAPILLARY: 89 mg/dL (ref 70–99)

## 2014-10-08 MED ORDER — FLUDEOXYGLUCOSE F - 18 (FDG) INJECTION
7.8800 | Freq: Once | INTRAVENOUS | Status: AC | PRN
  Administered 2014-10-08: 7.88 via INTRAVENOUS

## 2014-10-09 ENCOUNTER — Encounter: Payer: Self-pay | Admitting: Oncology

## 2014-10-10 ENCOUNTER — Other Ambulatory Visit: Payer: Self-pay | Admitting: *Deleted

## 2014-10-10 ENCOUNTER — Other Ambulatory Visit: Payer: 59

## 2014-10-10 ENCOUNTER — Ambulatory Visit: Payer: 59

## 2014-10-10 ENCOUNTER — Telehealth: Payer: Self-pay | Admitting: *Deleted

## 2014-10-10 ENCOUNTER — Ambulatory Visit: Payer: 59 | Admitting: Oncology

## 2014-10-10 DIAGNOSIS — C50919 Malignant neoplasm of unspecified site of unspecified female breast: Secondary | ICD-10-CM

## 2014-10-10 DIAGNOSIS — C7951 Secondary malignant neoplasm of bone: Principal | ICD-10-CM

## 2014-10-10 MED ORDER — PALBOCICLIB 125 MG PO CAPS: 125.0000 mg | ORAL_CAPSULE | Freq: Every day | ORAL | Status: DC

## 2014-10-10 NOTE — Telephone Encounter (Signed)
DR.SHERRILL WILL CALL PT. LATER TODAY WITH THE RESULTS OF HER PET SCAN. NOTIFIED PT.

## 2014-10-10 NOTE — Progress Notes (Signed)
Dr. Benay Spice called patient and discussed her labs/scan. Will start Faslodex 1/26 and repeat in 2 weeks. Start palbociclib 125 mg daily.

## 2014-10-13 ENCOUNTER — Telehealth: Payer: Self-pay | Admitting: *Deleted

## 2014-10-13 NOTE — Telephone Encounter (Signed)
SHE WILL TALK WITH DR.SHERRILL TOMORROW AT HER APPOINTMENT ABOUT RESCHEDULING HER INJECTION APPOINTMENT.

## 2014-10-14 ENCOUNTER — Encounter: Payer: Self-pay | Admitting: Oncology

## 2014-10-14 ENCOUNTER — Telehealth: Payer: Self-pay | Admitting: Oncology

## 2014-10-14 ENCOUNTER — Other Ambulatory Visit (HOSPITAL_BASED_OUTPATIENT_CLINIC_OR_DEPARTMENT_OTHER): Payer: 59

## 2014-10-14 ENCOUNTER — Ambulatory Visit (HOSPITAL_BASED_OUTPATIENT_CLINIC_OR_DEPARTMENT_OTHER): Payer: 59 | Admitting: Oncology

## 2014-10-14 ENCOUNTER — Ambulatory Visit: Payer: Self-pay

## 2014-10-14 ENCOUNTER — Other Ambulatory Visit: Payer: Self-pay | Admitting: *Deleted

## 2014-10-14 ENCOUNTER — Ambulatory Visit (HOSPITAL_COMMUNITY)
Admission: RE | Admit: 2014-10-14 | Discharge: 2014-10-14 | Disposition: A | Discharge status: Home / Self Care | Payer: 59 | Source: Ambulatory Visit | Attending: Oncology | Admitting: Oncology

## 2014-10-14 ENCOUNTER — Ambulatory Visit (HOSPITAL_BASED_OUTPATIENT_CLINIC_OR_DEPARTMENT_OTHER): Payer: 59

## 2014-10-14 VITALS — BP 126/70 | HR 75 | Temp 97.8°F | Resp 18 | Ht 65.0 in | Wt 154.5 lb

## 2014-10-14 DIAGNOSIS — C7951 Secondary malignant neoplasm of bone: Secondary | ICD-10-CM

## 2014-10-14 DIAGNOSIS — M79604 Pain in right leg: Secondary | ICD-10-CM | POA: Insufficient documentation

## 2014-10-14 DIAGNOSIS — C50919 Malignant neoplasm of unspecified site of unspecified female breast: Secondary | ICD-10-CM

## 2014-10-14 DIAGNOSIS — M79605 Pain in left leg: Secondary | ICD-10-CM | POA: Insufficient documentation

## 2014-10-14 DIAGNOSIS — M7989 Other specified soft tissue disorders: Secondary | ICD-10-CM | POA: Insufficient documentation

## 2014-10-14 DIAGNOSIS — Z23 Encounter for immunization: Secondary | ICD-10-CM

## 2014-10-14 DIAGNOSIS — C50911 Malignant neoplasm of unspecified site of right female breast: Secondary | ICD-10-CM

## 2014-10-14 LAB — COMPREHENSIVE METABOLIC PANEL (CC13)
ALK PHOS: 85 U/L (ref 40–150)
ALT: 54 U/L (ref 0–55)
ANION GAP: 11 meq/L (ref 3–11)
AST: 50 U/L — ABNORMAL HIGH (ref 5–34)
Albumin: 3.8 g/dL (ref 3.5–5.0)
BUN: 15.1 mg/dL (ref 7.0–26.0)
CHLORIDE: 107 meq/L (ref 98–109)
CO2: 26 mEq/L (ref 22–29)
Calcium: 9.9 mg/dL (ref 8.4–10.4)
Creatinine: 0.7 mg/dL (ref 0.6–1.1)
EGFR: 88 mL/min/{1.73_m2} — ABNORMAL LOW (ref >90)
Glucose: 87 mg/dl (ref 70–140)
Potassium: 3.9 mEq/L (ref 3.5–5.1)
SODIUM: 144 meq/L (ref 136–145)
TOTAL PROTEIN: 7.5 g/dL (ref 6.4–8.3)
Total Bilirubin: 0.58 mg/dL (ref 0.20–1.20)

## 2014-10-14 LAB — LIPID PANEL
CHOLESTEROL: 241 mg/dL — AB (ref 0–200)
HDL: 39 mg/dL — ABNORMAL LOW (ref >39)
LDL CALC: 154 mg/dL — AB (ref 0–99)
TRIGLYCERIDES: 238 mg/dL — AB (ref <150)
Total CHOL/HDL Ratio: 6.2 Ratio
VLDL: 48 mg/dL — ABNORMAL HIGH (ref 0–40)

## 2014-10-14 MED ORDER — HYDROCODONE-ACETAMINOPHEN 5-325 MG PO TABS: 1.0000 | ORAL_TABLET | Freq: Four times a day (QID) | ORAL | Status: DC | PRN

## 2014-10-14 MED ORDER — PALBOCICLIB 125 MG PO CAPS: 125.0000 mg | ORAL_CAPSULE | Freq: Every day | ORAL | Status: DC

## 2014-10-14 MED ORDER — PNEUMOCOCCAL 13-VAL CONJ VACC IM SUSP
0.5000 mL | Freq: Once | INTRAMUSCULAR | Status: AC
  Administered 2014-10-14: 0.5 mL via INTRAMUSCULAR
  Filled 2014-10-14: qty 0.5

## 2014-10-14 NOTE — Progress Notes (Signed)
[place] OFFICE PROGRESS NOTE   Diagnosis: Breast cancer  INTERVAL HISTORY:   Stacy Horn returns as scheduled. She has noted increased pain in the lower back and posterior neck for the past 3 weeks. She has also noted pain at the bilateral knee and upper tibia areas. The pain is now worse on the left tibia. The pain is relieved with hydrocodone. She is now taking hydrocodone more consistently.  She had a recent upper respiratory infection treated with antibiotics.  Objective:  Vital signs in last 24 hours:  Blood pressure 126/70, pulse 75, temperature 97.8 F (36.6 C), temperature source Oral, resp. rate 18, height _0  (1.651 m), weight 154 lb 8 oz (70.081 kg), last menstrual period 10/08/2014, SpO2 98 %.    HEENT: Neck without mass Lymphatics: No cervical or supra-clavicular nodes. 1/2 cm soft mobile bilateral axillary nodes Resp: Lungs clear bilaterally Cardio: Regular in rhythm GI: No hepatomegaly Vascular: Trace edema at the right lower leg Breast: Status post bilateral mastectomy with implants in place, no evidence for chest wall tumor recurrence  Musculoskeletal: Tender at the lower lumbar spine, no mass   Imaging: PET scan 10/08/2014, compared to 11/18/2013: Progression of widespread bone metastases. I reviewed the PET images with Stacy Horn and Stacy Horn. Medications: I have reviewed the patient's current medications.  Assessment/Plan: 1. Multicentric invasive lobular carcinoma of the right breast diagnosed in December of 1999, a right mastectomy followed by adjuvant [facility] chemotherapy, 5 years of tamoxifen, and 5 years of Femara. The Femara was completed in June of 2010. The right breast mass excision in January 2000 was ER positive, PR positive, and Stacy Horn negative  2. Pain at the left groin area with a CT of the pelvis on 02/07/12 confirming a destructive lytic lesion at the left pubic symphysis, status post a CT-guided biopsy on 02/16/12 confirming  metastatic carcinoma, focally ER positive .  -PET scan 03/07/2012 confirmed multiple hypermetabolic bone metastases with no other evidence of metastatic disease  -Status post palliative radiation to the pelvis beginning on 03/12/2012  -Initiation of Arimidex on 03/27/2012 -initiation of every three-month xgeva on 04/04/2012  -PET scan September 14 [id number] new hypermetabolic bone lesions  -Initiation of Faslodex 09/27/2012  -Restaging PET scan 04/08/2013 with evidence of disease progression in the bones  -Initiation of Aromasin/afinitor 05/29/2013  -Iliac biopsy at [facility] 05/22/2013 confirming Stacy Horn negative metastatic breast  -PET scan 11/18/2013 with a decrease in hypermetabolic activity  -PET scan 10/08/2014 with progressive diffuse hypermetabolic bone metastases 3. BRCA1 variant felt to be a polymorphism  4. Pain/tenderness at the left low anterolateral chest wall-she completed palliative radiation on 05/28/2012 with improvement in the pain. She was also treated with radiation to the thoracic spine.  5. Anemia/leukopenia-likely secondary to breast cancer involving the bone marrow and radiation /afinitor -the anemia is improved  6. History of mildly Elevated liver enzymes-? Related to afinitor,? Secondary to metastatic breast cancer  Disposition:  Stacy Horn has metastatic breast cancer involving the bones. There is now clinical and x-ray evidence of disease progression. I contacted Stacy by telephone on 10/10/2014 with the PET report. She discontinued Aromasin and afinitor.  I discussed treatment options with Stacy Horn. I recommend Faslodex and palbociclib. We reviewed the potential toxicities associated with palbociclib including the chance of hematologic toxicity. She agrees to proceed. The plan is to begin treatment within the next one week after insurance approval.  She will be referred for plain x-rays of the femurs and tibias today. The  PET scan covered only through the  low thigh.  She received a pneumococcal vaccine today.  [name] will return for an office visit in approximately 3 weeks. I will contact Stacy Horn to get Stacy opinion regarding additional treatment options. Stacy Coder, MD  10/14/2014  9:50 AM

## 2014-10-14 NOTE — Telephone Encounter (Signed)
[facility] faxed confirmation of facsimile receipt for [facility] prescription referral.  [facility] will verify insurance and make delivery arrangements with patient.

## 2014-10-14 NOTE — Telephone Encounter (Signed)
gv adn printed appt sched anda vs for pt for Feb °

## 2014-10-14 NOTE — Progress Notes (Signed)
[facility] approved [facility] from 10/14/14-10/15/15

## 2014-10-15 ENCOUNTER — Telehealth: Payer: Self-pay | Admitting: *Deleted

## 2014-10-15 NOTE — Telephone Encounter (Signed)
-----   Message from Ladell Pier, MD sent at 10/14/2014  8:19 PM EST ----- Please call patient, xrays show no bone lesions in the distal femurs or lower legs

## 2014-10-15 NOTE — Telephone Encounter (Signed)
Called pt with XRAY results. No bone lesions in distal femurs or lower legs. Pt viewed result online, stated she will see orthopedist if pain persists. Pt reports she is hesitant to go back to Dr. Willette Pa office, she will call us with new PCP to send lipid panel results or decision to remain with Dr. Bea Graff. She reports she has taken Crestor with good results in the past. Informed pt that she received Prevnar injection twice, six weeks apart. Should have received Pneumovac on 1/26. Informed her we will schedule Pneumovac at appropriate interval. Pt was understanding of incident.

## 2014-10-23 ENCOUNTER — Ambulatory Visit (HOSPITAL_BASED_OUTPATIENT_CLINIC_OR_DEPARTMENT_OTHER): Payer: 59

## 2014-10-23 ENCOUNTER — Other Ambulatory Visit (HOSPITAL_BASED_OUTPATIENT_CLINIC_OR_DEPARTMENT_OTHER): Payer: 59

## 2014-10-23 DIAGNOSIS — C7951 Secondary malignant neoplasm of bone: Principal | ICD-10-CM

## 2014-10-23 DIAGNOSIS — Z9889 Other specified postprocedural states: Secondary | ICD-10-CM

## 2014-10-23 DIAGNOSIS — C50911 Malignant neoplasm of unspecified site of right female breast: Secondary | ICD-10-CM

## 2014-10-23 DIAGNOSIS — C50919 Malignant neoplasm of unspecified site of unspecified female breast: Secondary | ICD-10-CM

## 2014-10-23 DIAGNOSIS — Z5111 Encounter for antineoplastic chemotherapy: Secondary | ICD-10-CM

## 2014-10-23 LAB — CBC WITH DIFFERENTIAL/PLATELET
BASO%: 0 % (ref 0.0–2.0)
BASOS ABS: 0 10*3/uL (ref 0.0–0.1)
EOS ABS: 0.1 10*3/uL (ref 0.0–0.5)
EOS%: 1.4 % (ref 0.0–7.0)
HCT: 36.2 % (ref 34.8–46.6)
HEMOGLOBIN: 12 g/dL (ref 11.6–15.9)
LYMPH%: 14.8 % (ref 14.0–49.7)
MCH: 27.8 pg (ref 25.1–34.0)
MCHC: 33.1 g/dL (ref 31.5–36.0)
MCV: 84 fL (ref 79.5–101.0)
MONO#: 0.4 10*3/uL (ref 0.1–0.9)
MONO%: 8.6 % (ref 0.0–14.0)
NEUT%: 75.2 % (ref 38.4–76.8)
NEUTROS ABS: 3.3 10*3/uL (ref 1.5–6.5)
Platelets: 296 10*3/uL (ref 145–400)
RBC: 4.31 10*6/uL (ref 3.70–5.45)
RDW: 15 % — ABNORMAL HIGH (ref 11.2–14.5)
WBC: 4.3 10*3/uL (ref 3.9–10.3)
lymph#: 0.6 10*3/uL — ABNORMAL LOW (ref 0.9–3.3)

## 2014-10-23 MED ORDER — DENOSUMAB 120 MG/1.7ML ~~LOC~~ SOLN: 120.0000 mg | Freq: Once | SUBCUTANEOUS | Status: DC

## 2014-10-23 MED ORDER — FULVESTRANT 250 MG/5ML IM SOLN
500.0000 mg | Freq: Once | INTRAMUSCULAR | Status: AC
  Administered 2014-10-23: 500 mg via INTRAMUSCULAR
  Filled 2014-10-23: qty 10

## 2014-10-23 MED ORDER — FULVESTRANT 250 MG/5ML IM SOLN: 500.0000 mg | INTRAMUSCULAR | Status: DC

## 2014-10-24 ENCOUNTER — Telehealth: Payer: Self-pay

## 2014-10-24 NOTE — Telephone Encounter (Signed)
Incoming fax from [facility] [employer], [place] refill request to Dr [facility]

## 2014-10-28 ENCOUNTER — Ambulatory Visit: Payer: 59

## 2014-11-06 ENCOUNTER — Ambulatory Visit (HOSPITAL_BASED_OUTPATIENT_CLINIC_OR_DEPARTMENT_OTHER): Payer: 59

## 2014-11-06 ENCOUNTER — Other Ambulatory Visit (HOSPITAL_BASED_OUTPATIENT_CLINIC_OR_DEPARTMENT_OTHER): Payer: 59

## 2014-11-06 DIAGNOSIS — Z9889 Other specified postprocedural states: Secondary | ICD-10-CM

## 2014-11-06 DIAGNOSIS — C7951 Secondary malignant neoplasm of bone: Secondary | ICD-10-CM

## 2014-11-06 DIAGNOSIS — Z5111 Encounter for antineoplastic chemotherapy: Secondary | ICD-10-CM

## 2014-11-06 DIAGNOSIS — C50919 Malignant neoplasm of unspecified site of unspecified female breast: Secondary | ICD-10-CM

## 2014-11-06 DIAGNOSIS — C50911 Malignant neoplasm of unspecified site of right female breast: Secondary | ICD-10-CM

## 2014-11-06 LAB — CBC WITH DIFFERENTIAL/PLATELET
BASO%: 0.4 % (ref 0.0–2.0)
BASOS ABS: 0 10*3/uL (ref 0.0–0.1)
EOS ABS: 0 10*3/uL (ref 0.0–0.5)
EOS%: 1.5 % (ref 0.0–7.0)
HEMATOCRIT: 34.5 % — AB (ref 34.8–46.6)
HGB: 11.4 g/dL — ABNORMAL LOW (ref 11.6–15.9)
LYMPH%: 39.3 % (ref 14.0–49.7)
MCH: 28.6 pg (ref 25.1–34.0)
MCHC: 32.9 g/dL (ref 31.5–36.0)
MCV: 86.9 fL (ref 79.5–101.0)
MONO#: 0.1 10*3/uL (ref 0.1–0.9)
MONO%: 5.7 % (ref 0.0–14.0)
NEUT#: 0.6 10*3/uL — ABNORMAL LOW (ref 1.5–6.5)
NEUT%: 53.1 % (ref 38.4–76.8)
PLATELETS: 140 10*3/uL — AB (ref 145–400)
RBC: 3.97 10*6/uL (ref 3.70–5.45)
RDW: 16.2 % — ABNORMAL HIGH (ref 11.2–14.5)
WBC: 1.1 10*3/uL — ABNORMAL LOW (ref 3.9–10.3)
lymph#: 0.4 10*3/uL — ABNORMAL LOW (ref 0.9–3.3)

## 2014-11-06 LAB — COMPREHENSIVE METABOLIC PANEL (CC13)
ALBUMIN: 3.8 g/dL (ref 3.5–5.0)
ALK PHOS: 64 U/L (ref 40–150)
ALT: 19 U/L (ref 0–55)
ANION GAP: 7 meq/L (ref 3–11)
AST: 20 U/L (ref 5–34)
BUN: 18.2 mg/dL (ref 7.0–26.0)
CHLORIDE: 106 meq/L (ref 98–109)
CO2: 28 mEq/L (ref 22–29)
Calcium: 9.4 mg/dL (ref 8.4–10.4)
Creatinine: 0.8 mg/dL (ref 0.6–1.1)
EGFR: 76 mL/min/{1.73_m2} — ABNORMAL LOW (ref >90)
Glucose: 71 mg/dl (ref 70–140)
POTASSIUM: 3.8 meq/L (ref 3.5–5.1)
SODIUM: 141 meq/L (ref 136–145)
Total Bilirubin: 0.51 mg/dL (ref 0.20–1.20)
Total Protein: 6.6 g/dL (ref 6.4–8.3)

## 2014-11-06 MED ORDER — FULVESTRANT 250 MG/5ML IM SOLN
500.0000 mg | Freq: Once | INTRAMUSCULAR | Status: AC
  Administered 2014-11-06: 500 mg via INTRAMUSCULAR
  Filled 2014-11-06: qty 10

## 2014-11-07 ENCOUNTER — Telehealth: Payer: Self-pay | Admitting: *Deleted

## 2014-11-07 NOTE — Telephone Encounter (Signed)
Left message on voicemail instructing pt to [facility]. Requested she call office to confirm instructions.

## 2014-11-07 NOTE — Telephone Encounter (Signed)
Pt returned call. She has taken today's Ibrance dose. Was due to complete cycle 2/21. She understands to HOLD remaining doses and follow up as scheduled. Pt instructed to call office for any fever or other signs of infection. She voiced understanding.

## 2014-11-07 NOTE — Telephone Encounter (Signed)
-----   Message from Ladell Pier, MD sent at 11/07/2014  8:48 AM EST ----- Please call patient,neutrophils are low, hold palbociclib, f/u as scheduled, call for fever

## 2014-11-13 ENCOUNTER — Ambulatory Visit (HOSPITAL_BASED_OUTPATIENT_CLINIC_OR_DEPARTMENT_OTHER): Payer: 59 | Admitting: Nurse Practitioner

## 2014-11-13 ENCOUNTER — Telehealth: Payer: Self-pay | Admitting: Nurse Practitioner

## 2014-11-13 ENCOUNTER — Other Ambulatory Visit (HOSPITAL_BASED_OUTPATIENT_CLINIC_OR_DEPARTMENT_OTHER): Payer: 59

## 2014-11-13 VITALS — BP 130/59 | HR 64 | Temp 98.4°F | Resp 18 | Ht 65.0 in | Wt 157.6 lb

## 2014-11-13 DIAGNOSIS — C50919 Malignant neoplasm of unspecified site of unspecified female breast: Secondary | ICD-10-CM

## 2014-11-13 DIAGNOSIS — C50911 Malignant neoplasm of unspecified site of right female breast: Secondary | ICD-10-CM

## 2014-11-13 DIAGNOSIS — D701 Agranulocytosis secondary to cancer chemotherapy: Secondary | ICD-10-CM

## 2014-11-13 DIAGNOSIS — C7951 Secondary malignant neoplasm of bone: Secondary | ICD-10-CM

## 2014-11-13 LAB — CBC WITH DIFFERENTIAL/PLATELET
BASO%: 0.8 % (ref 0.0–2.0)
BASOS ABS: 0 10*3/uL (ref 0.0–0.1)
EOS%: 0 % (ref 0.0–7.0)
Eosinophils Absolute: 0 10*3/uL (ref 0.0–0.5)
HCT: 35.5 % (ref 34.8–46.6)
HEMOGLOBIN: 12.1 g/dL (ref 11.6–15.9)
LYMPH#: 0.5 10*3/uL — AB (ref 0.9–3.3)
LYMPH%: 38.5 % (ref 14.0–49.7)
MCH: 29.7 pg (ref 25.1–34.0)
MCHC: 34.1 g/dL (ref 31.5–36.0)
MCV: 87.2 fL (ref 79.5–101.0)
MONO#: 0.2 10*3/uL (ref 0.1–0.9)
MONO%: 12.3 % (ref 0.0–14.0)
NEUT#: 0.6 10*3/uL — ABNORMAL LOW (ref 1.5–6.5)
NEUT%: 48.4 % (ref 38.4–76.8)
Platelets: 126 10*3/uL — ABNORMAL LOW (ref 145–400)
RBC: 4.07 10*6/uL (ref 3.70–5.45)
RDW: 21 % — AB (ref 11.2–14.5)
WBC: 1.3 10*3/uL — ABNORMAL LOW (ref 3.9–10.3)
nRBC: 0 % (ref 0–0)

## 2014-11-13 NOTE — Progress Notes (Addendum)
[place] OFFICE PROGRESS NOTE   Diagnosis:  Breast cancer  INTERVAL HISTORY:   Stacy Horn returns as scheduled. She began Faslodex on 10/23/2014. She received a second injection on 11/06/2014. She began Palbociclib on 10/20/2014. The Palbociclib was placed on hold on 11/07/2014 due to neutropenia.  She denies fever. No shaking chills. No shortness of breath or cough. No dysuria. She denies nausea/vomiting. No diarrhea. The leg pain overall is better. She continues to note knee pain with certain positions. She feels like the left knee is swollen. Several weeks ago she noted "right jaw bone" pain. She wonders if this was related to a sinus infection. The pain has resolved.  Objective:  Vital signs in last 24 hours:  Blood pressure 130/59, pulse 64, temperature 98.4 F (36.9 C), temperature source Oral, resp. rate 18, height '5\' 5"'  (1.651 m), weight 157 lb 9.6 oz (71.487 kg), last menstrual period 10/08/2014, SpO2 99 %.    HEENT: No thrush or ulcers. Lymphatics: No palpable cervical, supra clavicular or right axillary lymph nodes. 1 cm mobile left axillary lymph node. Resp: Lungs clear bilaterally. Cardio: Regular rate and rhythm. GI: Abdomen soft and nontender. No hepatomegaly. Vascular: No leg edema. Calves soft and nontender. MSK: The left knee appears mildly larger than the right knee.  Lab Results:  Lab Results  Component Value Date   WBC 1.3* 11/13/2014   HGB 12.1 11/13/2014   HCT 35.5 11/13/2014   MCV 87.2 11/13/2014   PLT 126* 11/13/2014   NEUTROABS 0.6* 11/13/2014    Imaging:  No results found.  Medications: I have reviewed the patient's current medications.  Assessment/Plan: 1. Multicentric invasive lobular carcinoma of the right breast diagnosed in December of 1999, a right mastectomy followed by adjuvant [facility] chemotherapy, 5 years of tamoxifen, and 5 years of Femara. The Femara was completed in June of 2010. The right breast mass excision in  January 2000 was ER positive, PR positive, and HER-2 negative  2. Pain at the left groin area with a CT of the pelvis on 02/07/12 confirming a destructive lytic lesion at the left pubic symphysis, status post a CT-guided biopsy on 02/16/12 confirming metastatic carcinoma, focally ER positive .  -PET scan 03/07/2012 confirmed multiple hypermetabolic bone metastases with no other evidence of metastatic disease  -Status post palliative radiation to the pelvis beginning on 03/12/2012  -Initiation of Arimidex on 03/27/2012 -initiation of every three-month xgeva on 04/04/2012  -PET scan September 14 [id number] new hypermetabolic bone lesions  -Initiation of Faslodex 09/27/2012  -Restaging PET scan 04/08/2013 with evidence of disease progression in the bones  -Initiation of Aromasin/afinitor 05/29/2013  -Iliac biopsy at [facility] 05/22/2013 confirming HER-2 negative metastatic breast  -PET scan 11/18/2013 with a decrease in hypermetabolic activity  -PET scan 10/08/2014 with progressive diffuse hypermetabolic bone metastases -Initiation of Palbociclib 10/20/2014; Faslodex 10/23/2014. -Palbociclib placed on hold beginning 11/07/2014 due to neutropenia. 3. BRCA1 variant felt to be a polymorphism  4. Pain/tenderness at the left low anterolateral chest wall-she completed palliative radiation on 05/28/2012 with improvement in the pain. She was also treated with radiation to the thoracic spine.  5. Anemia/leukopenia-likely secondary to breast cancer involving the bone marrow and radiation /afinitor -the anemia is improved  6. History of mildly elevated liver enzymes-? Related to afinitor,? Secondary to metastatic breast cancer 7. Neutropenia secondary to Palbociclib   Disposition: Ms. Kestner appears stable. She will continue to hold the Palbociclib. We will obtain a CBC on 11/20/2014. If the neutrophil count has  recovered the plan is to resume Palbociclib on an every other day dosing schedule. She  will return for the next Faslodex injection and Xgeva on 11/20/2014. We will see her in follow-up on 12/18/2014.  She will contact the office in the interim with any problems. We specifically reviewed neutropenic precautions.  Patient seen with Dr. Benay Spice. 25 minutes were spent face-to-face at today's visit with the majority of that time involved in counseling/coordination of care.    Ned Card ANP/GNP-BC   11/13/2014  10:43 AM  This was a shared visit with Ned Card. The plan is to resume Palbciclib with a dose reduction when the neutrophil count has recovered. She will continue Faslodex.  Julieanne Manson, M.D.

## 2014-11-13 NOTE — Telephone Encounter (Signed)
Gave avs & calendar for March. °

## 2014-11-19 ENCOUNTER — Other Ambulatory Visit: Payer: Self-pay | Admitting: *Deleted

## 2014-11-19 DIAGNOSIS — C50919 Malignant neoplasm of unspecified site of unspecified female breast: Secondary | ICD-10-CM

## 2014-11-19 DIAGNOSIS — C7951 Secondary malignant neoplasm of bone: Principal | ICD-10-CM

## 2014-11-19 MED ORDER — HYDROCODONE-ACETAMINOPHEN 5-325 MG PO TABS: 1.0000 | ORAL_TABLET | Freq: Four times a day (QID) | ORAL | Status: DC | PRN

## 2014-11-19 NOTE — Telephone Encounter (Signed)
Notified pt that re-fill request for pain med is complete and will be available to p/u at appt 11/20/14.  Pt verbalized understanding and expressed appreciation for call.

## 2014-11-20 ENCOUNTER — Ambulatory Visit (HOSPITAL_BASED_OUTPATIENT_CLINIC_OR_DEPARTMENT_OTHER): Payer: 59

## 2014-11-20 ENCOUNTER — Other Ambulatory Visit (HOSPITAL_BASED_OUTPATIENT_CLINIC_OR_DEPARTMENT_OTHER): Payer: 59

## 2014-11-20 DIAGNOSIS — Z5111 Encounter for antineoplastic chemotherapy: Secondary | ICD-10-CM

## 2014-11-20 DIAGNOSIS — D701 Agranulocytosis secondary to cancer chemotherapy: Secondary | ICD-10-CM

## 2014-11-20 DIAGNOSIS — C50919 Malignant neoplasm of unspecified site of unspecified female breast: Secondary | ICD-10-CM

## 2014-11-20 DIAGNOSIS — C50911 Malignant neoplasm of unspecified site of right female breast: Secondary | ICD-10-CM

## 2014-11-20 DIAGNOSIS — Z9889 Other specified postprocedural states: Secondary | ICD-10-CM

## 2014-11-20 DIAGNOSIS — C7951 Secondary malignant neoplasm of bone: Secondary | ICD-10-CM

## 2014-11-20 LAB — CBC WITH DIFFERENTIAL/PLATELET
BASO%: 0.5 % (ref 0.0–2.0)
Basophils Absolute: 0 10*3/uL (ref 0.0–0.1)
EOS%: 1 % (ref 0.0–7.0)
Eosinophils Absolute: 0 10*3/uL (ref 0.0–0.5)
HEMATOCRIT: 35.2 % (ref 34.8–46.6)
HEMOGLOBIN: 11.8 g/dL (ref 11.6–15.9)
LYMPH%: 33 % (ref 14.0–49.7)
MCH: 29.9 pg (ref 25.1–34.0)
MCHC: 33.5 g/dL (ref 31.5–36.0)
MCV: 89.3 fL (ref 79.5–101.0)
MONO#: 0.2 10*3/uL (ref 0.1–0.9)
MONO%: 12 % (ref 0.0–14.0)
NEUT#: 1 10*3/uL — ABNORMAL LOW (ref 1.5–6.5)
NEUT%: 53.5 % (ref 38.4–76.8)
PLATELETS: 202 10*3/uL (ref 145–400)
RBC: 3.94 10*6/uL (ref 3.70–5.45)
RDW: 21.4 % — ABNORMAL HIGH (ref 11.2–14.5)
WBC: 1.9 10*3/uL — ABNORMAL LOW (ref 3.9–10.3)
lymph#: 0.6 10*3/uL — ABNORMAL LOW (ref 0.9–3.3)

## 2014-11-20 MED ORDER — FULVESTRANT 250 MG/5ML IM SOLN
500.0000 mg | Freq: Once | INTRAMUSCULAR | Status: AC
  Administered 2014-11-20: 500 mg via INTRAMUSCULAR
  Filled 2014-11-20: qty 10

## 2014-11-20 MED ORDER — DENOSUMAB 120 MG/1.7ML ~~LOC~~ SOLN
120.0000 mg | Freq: Once | SUBCUTANEOUS | Status: AC
  Administered 2014-11-20: 120 mg via SUBCUTANEOUS
  Filled 2014-11-20: qty 1.7

## 2014-11-20 NOTE — Patient Instructions (Signed)
Fulvestrant injection What is this medicine? FULVESTRANT (ful VES trant) blocks the effects of estrogen. It is used to treat breast cancer in women past the age of menopause. This medicine may be used for other purposes; ask your health care provider or pharmacist if you have questions. COMMON BRAND NAME(S): FASLODEX What should I tell my health care provider before I take this medicine? They need to know if you have any of these conditions: -bleeding problems -liver disease -low levels of platelets in the blood -an unusual or allergic reaction to fulvestrant, other medicines, foods, dyes, or preservatives -pregnant or trying to get pregnant -breast-feeding How should I use this medicine? This medicine is for injection into a muscle. It is usually given by a health care professional in a hospital or clinic setting. Talk to your pediatrician regarding the use of this medicine in children. Special care may be needed. Overdosage: If you think you have taken too much of this medicine contact a poison control center or emergency room at once. NOTE: This medicine is only for you. Do not share this medicine with others. What if I miss a dose? It is important not to miss your dose. Call your doctor or health care professional if you are unable to keep an appointment. What may interact with this medicine? -medicines that treat or prevent blood clots like warfarin, enoxaparin, and dalteparin This list may not describe all possible interactions. Give your health care provider a list of all the medicines, herbs, non-prescription drugs, or dietary supplements you use. Also tell them if you smoke, drink alcohol, or use illegal drugs. Some items may interact with your medicine. What should I watch for while using this medicine? Your condition will be monitored carefully while you are receiving this medicine. You will need important blood work done while you are taking this medicine. Do not become pregnant  while taking this medicine. Women should inform their doctor if they wish to become pregnant or think they might be pregnant. There is a potential for serious side effects to an unborn child. Talk to your health care professional or pharmacist for more information. What side effects may I notice from receiving this medicine? Side effects that you should report to your doctor or health care professional as soon as possible: -allergic reactions like skin rash, itching or hives, swelling of the face, lips, or tongue -feeling faint or lightheaded, falls -fever or flu-like symptoms -sore throat -vaginal bleeding Side effects that usually do not require medical attention (report to your doctor or health care professional if they continue or are bothersome): -aches, pains -constipation or diarrhea -headache -hot flashes -nausea, vomiting -pain at site where injected -stomach pain This list may not describe all possible side effects. Call your doctor for medical advice about side effects. You may report side effects to FDA at 1-800-FDA-1088. Where should I keep my medicine? This drug is given in a hospital or clinic and will not be stored at home. NOTE: This sheet is a summary. It may not cover all possible information. If you have questions about this medicine, talk to your doctor, pharmacist, or health care provider.  2015, Elsevier/Gold Standard. (2008-01-14 15:39:24) Denosumab injection What is this medicine? DENOSUMAB (den oh sue mab) slows bone breakdown. Prolia is used to treat osteoporosis in women after menopause and in men. Delton See is used to prevent bone fractures and other bone problems caused by cancer bone metastases. Delton See is also used to treat giant cell tumor of the bone. This  medicine may be used for other purposes; ask your health care provider or pharmacist if you have questions. COMMON BRAND NAME(S): Prolia, XGEVA What should I tell my health care provider before I take this  medicine? They need to know if you have any of these conditions: -dental disease -eczema -infection or history of infections -kidney disease or on dialysis -low blood calcium or vitamin D -malabsorption syndrome -scheduled to have surgery or tooth extraction -taking medicine that contains denosumab -thyroid or parathyroid disease -an unusual reaction to denosumab, other medicines, foods, dyes, or preservatives -pregnant or trying to get pregnant -breast-feeding How should I use this medicine? This medicine is for injection under the skin. It is given by a health care professional in a hospital or clinic setting. If you are getting Prolia, a special MedGuide will be given to you by the pharmacist with each prescription and refill. Be sure to read this information carefully each time. For Prolia, talk to your pediatrician regarding the use of this medicine in children. Special care may be needed. For Delton See, talk to your pediatrician regarding the use of this medicine in children. While this drug may be prescribed for children as young as 13 years for selected conditions, precautions do apply. Overdosage: If you think you've taken too much of this medicine contact a poison control center or emergency room at once. Overdosage: If you think you have taken too much of this medicine contact a poison control center or emergency room at once. NOTE: This medicine is only for you. Do not share this medicine with others. What if I miss a dose? It is important not to miss your dose. Call your doctor or health care professional if you are unable to keep an appointment. What may interact with this medicine? Do not take this medicine with any of the following medications: -other medicines containing denosumab This medicine may also interact with the following medications: -medicines that suppress the immune system -medicines that treat cancer -steroid medicines like prednisone or cortisone This list  may not describe all possible interactions. Give your health care provider a list of all the medicines, herbs, non-prescription drugs, or dietary supplements you use. Also tell them if you smoke, drink alcohol, or use illegal drugs. Some items may interact with your medicine. What should I watch for while using this medicine? Visit your doctor or health care professional for regular checks on your progress. Your doctor or health care professional may order blood tests and other tests to see how you are doing. Call your doctor or health care professional if you get a cold or other infection while receiving this medicine. Do not treat yourself. This medicine may decrease your body's ability to fight infection. You should make sure you get enough calcium and vitamin D while you are taking this medicine, unless your doctor tells you not to. Discuss the foods you eat and the vitamins you take with your health care professional. See your dentist regularly. Brush and floss your teeth as directed. Before you have any dental work done, tell your dentist you are receiving this medicine. Do not become pregnant while taking this medicine or for 5 months after stopping it. Women should inform their doctor if they wish to become pregnant or think they might be pregnant. There is a potential for serious side effects to an unborn child. Talk to your health care professional or pharmacist for more information. What side effects may I notice from receiving this medicine? Side effects that you  should report to your doctor or health care professional as soon as possible: -allergic reactions like skin rash, itching or hives, swelling of the face, lips, or tongue -breathing problems -chest pain -fast, irregular heartbeat -feeling faint or lightheaded, falls -fever, chills, or any other sign of infection -muscle spasms, tightening, or twitches -numbness or tingling -skin blisters or bumps, or is dry, peels, or red -slow  healing or unexplained pain in the mouth or jaw -unusual bleeding or bruising Side effects that usually do not require medical attention (Report these to your doctor or health care professional if they continue or are bothersome.): -muscle pain -stomach upset, gas This list may not describe all possible side effects. Call your doctor for medical advice about side effects. You may report side effects to FDA at 1-800-FDA-1088. Where should I keep my medicine? This medicine is only given in a clinic, doctor's office, or other health care setting and will not be stored at home. NOTE: This sheet is a summary. It may not cover all possible information. If you have questions about this medicine, talk to your doctor, pharmacist, or health care provider.  2015, Elsevier/Gold Standard. (2012-03-05 12:37:47)

## 2014-11-21 ENCOUNTER — Other Ambulatory Visit: Payer: Self-pay | Admitting: Nurse Practitioner

## 2014-11-21 ENCOUNTER — Telehealth: Payer: Self-pay | Admitting: *Deleted

## 2014-11-21 DIAGNOSIS — C7951 Secondary malignant neoplasm of bone: Principal | ICD-10-CM

## 2014-11-21 DIAGNOSIS — C50919 Malignant neoplasm of unspecified site of unspecified female breast: Secondary | ICD-10-CM

## 2014-11-21 NOTE — Telephone Encounter (Signed)
-----   Message from Owens Shark, NP sent at 11/21/2014  2:45 PM EST ----- Please let her know blood counts are better but have not completely recovered. Continue to hold palbociclib and return for a repeat CBC in one week.

## 2014-11-21 NOTE — Telephone Encounter (Signed)
Patient called to ask whether she should restart Ibrance.  She had a CBC today with an [place] of 1.0.

## 2014-11-21 NOTE — Telephone Encounter (Signed)
Spoke with pt and advised to continue to hold palbociclib and rtn in 1 week for a CBC.  Pt verbalized an understanding. "I will see you on Thursday for my labs".

## 2014-11-27 ENCOUNTER — Other Ambulatory Visit (HOSPITAL_BASED_OUTPATIENT_CLINIC_OR_DEPARTMENT_OTHER): Payer: 59

## 2014-11-27 DIAGNOSIS — C50911 Malignant neoplasm of unspecified site of right female breast: Secondary | ICD-10-CM

## 2014-11-27 DIAGNOSIS — C50919 Malignant neoplasm of unspecified site of unspecified female breast: Secondary | ICD-10-CM

## 2014-11-27 DIAGNOSIS — Z7951 Long term (current) use of inhaled steroids: Secondary | ICD-10-CM

## 2014-11-27 DIAGNOSIS — C7951 Secondary malignant neoplasm of bone: Principal | ICD-10-CM

## 2014-11-27 LAB — CBC WITH DIFFERENTIAL/PLATELET
BASO%: 0.4 % (ref 0.0–2.0)
BASOS ABS: 0 10*3/uL (ref 0.0–0.1)
EOS%: 1.1 % (ref 0.0–7.0)
Eosinophils Absolute: 0 10*3/uL (ref 0.0–0.5)
HCT: 38.4 % (ref 34.8–46.6)
HGB: 12.8 g/dL (ref 11.6–15.9)
LYMPH%: 29.5 % (ref 14.0–49.7)
MCH: 30 pg (ref 25.1–34.0)
MCHC: 33.3 g/dL (ref 31.5–36.0)
MCV: 90.1 fL (ref 79.5–101.0)
MONO#: 0.3 10*3/uL (ref 0.1–0.9)
MONO%: 9.1 % (ref 0.0–14.0)
NEUT%: 59.9 % (ref 38.4–76.8)
NEUTROS ABS: 1.7 10*3/uL (ref 1.5–6.5)
PLATELETS: 286 10*3/uL (ref 145–400)
RBC: 4.26 10*6/uL (ref 3.70–5.45)
RDW: 20.5 % — ABNORMAL HIGH (ref 11.2–14.5)
WBC: 2.8 10*3/uL — ABNORMAL LOW (ref 3.9–10.3)
lymph#: 0.8 10*3/uL — ABNORMAL LOW (ref 0.9–3.3)

## 2014-11-28 ENCOUNTER — Telehealth: Payer: Self-pay | Admitting: *Deleted

## 2014-11-28 DIAGNOSIS — C50919 Malignant neoplasm of unspecified site of unspecified female breast: Secondary | ICD-10-CM

## 2014-11-28 DIAGNOSIS — C7951 Secondary malignant neoplasm of bone: Principal | ICD-10-CM

## 2014-11-28 NOTE — Telephone Encounter (Signed)
Called and informed patient to resume Palbociclib every other day for 10 doses.  Also, informed patient that schedulers will be calling with lab appointment.  Per Elby Showers. Marcello Moores, NP.  Patient verbalized understanding.

## 2014-11-28 NOTE — Telephone Encounter (Signed)
-----   Message from Owens Shark, NP sent at 11/28/2014  9:07 AM EST ----- Please instruct her to resume Palbociclib every other day. She will need a CBC in 2 weeks.

## 2014-12-18 ENCOUNTER — Ambulatory Visit (HOSPITAL_BASED_OUTPATIENT_CLINIC_OR_DEPARTMENT_OTHER): Payer: 59 | Admitting: Oncology

## 2014-12-18 ENCOUNTER — Other Ambulatory Visit (HOSPITAL_BASED_OUTPATIENT_CLINIC_OR_DEPARTMENT_OTHER): Payer: 59

## 2014-12-18 ENCOUNTER — Ambulatory Visit (HOSPITAL_BASED_OUTPATIENT_CLINIC_OR_DEPARTMENT_OTHER): Payer: 59

## 2014-12-18 ENCOUNTER — Ambulatory Visit: Payer: 59

## 2014-12-18 ENCOUNTER — Telehealth: Payer: Self-pay | Admitting: Oncology

## 2014-12-18 VITALS — BP 107/59 | HR 64 | Temp 98.0°F | Resp 18 | Ht 65.0 in | Wt 160.2 lb

## 2014-12-18 DIAGNOSIS — C50919 Malignant neoplasm of unspecified site of unspecified female breast: Secondary | ICD-10-CM

## 2014-12-18 DIAGNOSIS — C7951 Secondary malignant neoplasm of bone: Secondary | ICD-10-CM

## 2014-12-18 DIAGNOSIS — C50911 Malignant neoplasm of unspecified site of right female breast: Secondary | ICD-10-CM

## 2014-12-18 DIAGNOSIS — Z5111 Encounter for antineoplastic chemotherapy: Secondary | ICD-10-CM

## 2014-12-18 DIAGNOSIS — D701 Agranulocytosis secondary to cancer chemotherapy: Secondary | ICD-10-CM

## 2014-12-18 LAB — CBC WITH DIFFERENTIAL/PLATELET
BASO%: 0.5 % (ref 0.0–2.0)
BASOS ABS: 0 10*3/uL (ref 0.0–0.1)
EOS%: 1.6 % (ref 0.0–7.0)
Eosinophils Absolute: 0 10*3/uL (ref 0.0–0.5)
HCT: 37.2 % (ref 34.8–46.6)
HGB: 12.7 g/dL (ref 11.6–15.9)
LYMPH#: 0.5 10*3/uL — AB (ref 0.9–3.3)
LYMPH%: 27.3 % (ref 14.0–49.7)
MCH: 31.4 pg (ref 25.1–34.0)
MCHC: 34.1 g/dL (ref 31.5–36.0)
MCV: 91.9 fL (ref 79.5–101.0)
MONO#: 0.1 10*3/uL (ref 0.1–0.9)
MONO%: 5.9 % (ref 0.0–14.0)
NEUT%: 64.7 % (ref 38.4–76.8)
NEUTROS ABS: 1.2 10*3/uL — AB (ref 1.5–6.5)
Platelets: 139 10*3/uL — ABNORMAL LOW (ref 145–400)
RBC: 4.05 10*6/uL (ref 3.70–5.45)
RDW: 19.3 % — ABNORMAL HIGH (ref 11.2–14.5)
WBC: 1.9 10*3/uL — ABNORMAL LOW (ref 3.9–10.3)

## 2014-12-18 LAB — COMPREHENSIVE METABOLIC PANEL (CC13)
ALBUMIN: 4 g/dL (ref 3.5–5.0)
ALT: 21 U/L (ref 0–55)
ANION GAP: 9 meq/L (ref 3–11)
AST: 22 U/L (ref 5–34)
Alkaline Phosphatase: 61 U/L (ref 40–150)
BUN: 15.8 mg/dL (ref 7.0–26.0)
CHLORIDE: 105 meq/L (ref 98–109)
CO2: 27 meq/L (ref 22–29)
Calcium: 9.7 mg/dL (ref 8.4–10.4)
Creatinine: 0.8 mg/dL (ref 0.6–1.1)
EGFR: 79 mL/min/{1.73_m2} — AB (ref >90)
Glucose: 88 mg/dl (ref 70–140)
POTASSIUM: 4.7 meq/L (ref 3.5–5.1)
Sodium: 142 mEq/L (ref 136–145)
Total Bilirubin: 0.59 mg/dL (ref 0.20–1.20)
Total Protein: 7 g/dL (ref 6.4–8.3)

## 2014-12-18 MED ORDER — HYDROCODONE-ACETAMINOPHEN 7.5-325 MG PO TABS: 1.0000 | ORAL_TABLET | Freq: Four times a day (QID) | ORAL | Status: DC | PRN

## 2014-12-18 MED ORDER — FULVESTRANT 250 MG/5ML IM SOLN
500.0000 mg | Freq: Once | INTRAMUSCULAR | Status: AC
  Administered 2014-12-18: 500 mg via INTRAMUSCULAR
  Filled 2014-12-18: qty 10

## 2014-12-18 NOTE — Progress Notes (Signed)
[place] OFFICE PROGRESS NOTE   Diagnosis: Breast cancer  INTERVAL HISTORY:   Stacy Horn returns as scheduled. She resumed Ibance 11/27/2014 on an every other day schedule. No apparent toxicity from this drug. No rash, mouth sores, or diarrhea. She continues to have intermittent pain in the back and pelvis. She takes approximate 4 hydrocodone tablet per day for relief of pain.    Objective:  Vital signs in last 24 hours:  Blood pressure 107/59, pulse 64, temperature 98 F (36.7 C), temperature source Oral, resp. rate 18, height _0  (1.651 m), weight 160 lb 3.2 oz (72.666 kg), last menstrual period 10/08/2014, SpO2 99 %.  Lymphatics: 1/2 cm mobile left axillary node. Resp: Lungs clear bilaterally Cardio: Regular rate and rhythm GI: No hepatomegaly, nontender Vascular: No leg edema Musculoskeletal: Mild tenderness at the left posterior iliac     Lab Results:  Lab Results  Component Value Date   WBC 1.9* 12/18/2014   HGB 12.7 12/18/2014   HCT 37.2 12/18/2014   MCV 91.9 12/18/2014   PLT 139* 12/18/2014   NEUTROABS 1.2* 12/18/2014    Medications: I have reviewed the patient's current medications.  Assessment/Plan: 1. Multicentric invasive lobular carcinoma of the right breast diagnosed in December of 1999, a right mastectomy followed by adjuvant [facility] chemotherapy, 5 years of tamoxifen, and 5 years of Femara. The Femara was completed in June of 2010. The right breast mass excision in January 2000 was ER positive, PR positive, and HER-2 negative  2. Pain at the left groin area with a CT of the pelvis on 02/07/12 confirming a destructive lytic lesion at the left pubic symphysis, status post a CT-guided biopsy on 02/16/12 confirming metastatic carcinoma, focally ER positive .  -PET scan 03/07/2012 confirmed multiple hypermetabolic bone metastases with no other evidence of metastatic disease  -Status post palliative radiation to the pelvis beginning on  03/12/2012  -Initiation of Arimidex on 03/27/2012 -initiation of every three-month xgeva on 04/04/2012  -PET scan September 15 [id number] new hypermetabolic bone lesions  -Initiation of Faslodex 09/27/2012  -Restaging PET scan 04/08/2013 with evidence of disease progression in the bones  -Initiation of Aromasin/afinitor 05/29/2013  -Iliac biopsy at [facility] 05/22/2013 confirming HER-2 negative metastatic breast  -PET scan 11/18/2013 with a decrease in hypermetabolic activity  -PET scan 10/08/2014 with progressive diffuse hypermetabolic bone metastases -Initiation of Palbociclib 10/20/2014; Faslodex 10/23/2014. -Palbociclib placed on hold beginning 11/07/2014 due to neutropenia. -Palbociclib resumed 11/27/2014 on an every other day schedule 3. BRCA1 variant felt to be a polymorphism  4. Pain/tenderness at the left low anterolateral chest wall-she completed palliative radiation on 05/28/2012 with improvement in the pain. She was also treated with radiation to the thoracic spine.  5. Anemia/leukopenia-likely secondary to breast cancer involving the bone marrow and radiation /afinitor -the anemia is improved  6. History of mildly elevated liver enzymes-? Related to afinitor,? Secondary to metastatic breast cancer 7. Neutropenia secondary to Palbociclib   Disposition:  Her overall status appears unchanged. She has developed mild neutropenia since resuming palbociclib on an every other day schedule. She will continue this dose for 4 additional treatments. She will then hold palbociclib until she returns for a CBC 01/01/2015. The plan is to begin 21 day dose reduced palbociclib 01/01/2015.  She continues monthly Faslodex. She will return for an office visit and Faslodex on 01/15/2015.  We increase the hydrocodone dose today since she occasionally has to take 1.5 pills with the 5 mg dose.    [name],  MD  12/18/2014  10:56 AM

## 2014-12-18 NOTE — Telephone Encounter (Signed)
Pt confirmed labs/ovinj per 03/31 POF, gave pt AVS and Calendar.... KJ

## 2014-12-23 ENCOUNTER — Telehealth: Payer: Self-pay | Admitting: *Deleted

## 2014-12-23 ENCOUNTER — Other Ambulatory Visit: Payer: Self-pay | Admitting: *Deleted

## 2014-12-23 MED ORDER — PALBOCICLIB 100 MG PO CAPS: 100.0000 mg | ORAL_CAPSULE | Freq: Every day | ORAL | Status: DC

## 2014-12-23 NOTE — Telephone Encounter (Signed)
Faxed Ibrance refill (with dose reduction) to [facility]. Pt reported at last visit her insurance is requiring her to switch pharmacies.

## 2014-12-23 NOTE — Telephone Encounter (Signed)
Refill request for Ibrance taken to Dr Benay Spice for signature

## 2014-12-29 ENCOUNTER — Other Ambulatory Visit: Payer: Self-pay | Admitting: Oncology

## 2015-01-01 ENCOUNTER — Other Ambulatory Visit (HOSPITAL_BASED_OUTPATIENT_CLINIC_OR_DEPARTMENT_OTHER): Payer: 59

## 2015-01-01 DIAGNOSIS — C50919 Malignant neoplasm of unspecified site of unspecified female breast: Secondary | ICD-10-CM

## 2015-01-01 DIAGNOSIS — C7951 Secondary malignant neoplasm of bone: Secondary | ICD-10-CM | POA: Diagnosis not present

## 2015-01-01 DIAGNOSIS — C50911 Malignant neoplasm of unspecified site of right female breast: Secondary | ICD-10-CM

## 2015-01-01 LAB — CBC WITH DIFFERENTIAL/PLATELET
BASO%: 0.8 % (ref 0.0–2.0)
BASOS ABS: 0 10*3/uL (ref 0.0–0.1)
EOS ABS: 0 10*3/uL (ref 0.0–0.5)
EOS%: 0.7 % (ref 0.0–7.0)
HEMATOCRIT: 35.9 % (ref 34.8–46.6)
HEMOGLOBIN: 12.2 g/dL (ref 11.6–15.9)
LYMPH%: 30.8 % (ref 14.0–49.7)
MCH: 31.9 pg (ref 25.1–34.0)
MCHC: 34.1 g/dL (ref 31.5–36.0)
MCV: 93.5 fL (ref 79.5–101.0)
MONO#: 0.3 10*3/uL (ref 0.1–0.9)
MONO%: 13.6 % (ref 0.0–14.0)
NEUT#: 1.2 10*3/uL — ABNORMAL LOW (ref 1.5–6.5)
NEUT%: 54.1 % (ref 38.4–76.8)
Platelets: 197 10*3/uL (ref 145–400)
RBC: 3.83 10*6/uL (ref 3.70–5.45)
RDW: 21.3 % — AB (ref 11.2–14.5)
WBC: 2.2 10*3/uL — ABNORMAL LOW (ref 3.9–10.3)
lymph#: 0.7 10*3/uL — ABNORMAL LOW (ref 0.9–3.3)

## 2015-01-02 ENCOUNTER — Telehealth: Payer: Self-pay | Admitting: *Deleted

## 2015-01-02 MED ORDER — PALBOCICLIB 100 MG PO CAPS: 100.0000 mg | ORAL_CAPSULE | Freq: Every day | ORAL | Status: DC

## 2015-01-02 NOTE — Telephone Encounter (Signed)
[facility] fax: [phone]. Called the pharmacy, they report the prescription faxed on 12/23/14 has not been received/ processed. Verbal order given for Ibrance 100 mg daily x21 days. They will contact pt to arrange shipment today. Per pharmacist, above is a "better fax number" for the pharmacy.

## 2015-01-02 NOTE — Telephone Encounter (Signed)
-----   Message from Ladell Pier, MD sent at 01/01/2015  9:59 PM EDT ----- Please call patient, anc is stable, begin palbociclib at reduded dose, f/u as scheduled for cbc 2 weeks

## 2015-01-02 NOTE — Addendum Note (Signed)
Addended by: Brien Few on: 01/02/2015 01:27 PM   Modules accepted: Orders

## 2015-01-15 ENCOUNTER — Ambulatory Visit (HOSPITAL_BASED_OUTPATIENT_CLINIC_OR_DEPARTMENT_OTHER): Payer: 59 | Admitting: Nurse Practitioner

## 2015-01-15 ENCOUNTER — Telehealth: Payer: Self-pay | Admitting: Nurse Practitioner

## 2015-01-15 ENCOUNTER — Other Ambulatory Visit (HOSPITAL_BASED_OUTPATIENT_CLINIC_OR_DEPARTMENT_OTHER): Payer: 59

## 2015-01-15 ENCOUNTER — Ambulatory Visit (HOSPITAL_BASED_OUTPATIENT_CLINIC_OR_DEPARTMENT_OTHER): Payer: 59

## 2015-01-15 VITALS — BP 102/53 | HR 71 | Temp 97.9°F | Resp 18 | Ht 65.0 in | Wt 162.0 lb

## 2015-01-15 DIAGNOSIS — C50911 Malignant neoplasm of unspecified site of right female breast: Secondary | ICD-10-CM

## 2015-01-15 DIAGNOSIS — Z9889 Other specified postprocedural states: Secondary | ICD-10-CM

## 2015-01-15 DIAGNOSIS — C7951 Secondary malignant neoplasm of bone: Secondary | ICD-10-CM

## 2015-01-15 DIAGNOSIS — D630 Anemia in neoplastic disease: Secondary | ICD-10-CM

## 2015-01-15 DIAGNOSIS — Z5111 Encounter for antineoplastic chemotherapy: Secondary | ICD-10-CM | POA: Diagnosis not present

## 2015-01-15 DIAGNOSIS — D702 Other drug-induced agranulocytosis: Secondary | ICD-10-CM

## 2015-01-15 DIAGNOSIS — C50919 Malignant neoplasm of unspecified site of unspecified female breast: Secondary | ICD-10-CM

## 2015-01-15 LAB — COMPREHENSIVE METABOLIC PANEL (CC13)
ALBUMIN: 4 g/dL (ref 3.5–5.0)
ALT: 23 U/L (ref 0–55)
ANION GAP: 13 meq/L — AB (ref 3–11)
AST: 24 U/L (ref 5–34)
Alkaline Phosphatase: 64 U/L (ref 40–150)
BUN: 19.6 mg/dL (ref 7.0–26.0)
CHLORIDE: 105 meq/L (ref 98–109)
CO2: 24 meq/L (ref 22–29)
Calcium: 9.8 mg/dL (ref 8.4–10.4)
Creatinine: 0.8 mg/dL (ref 0.6–1.1)
EGFR: 80 mL/min/{1.73_m2} — ABNORMAL LOW (ref >90)
GLUCOSE: 86 mg/dL (ref 70–140)
POTASSIUM: 3.9 meq/L (ref 3.5–5.1)
SODIUM: 142 meq/L (ref 136–145)
TOTAL PROTEIN: 7.2 g/dL (ref 6.4–8.3)
Total Bilirubin: 0.56 mg/dL (ref 0.20–1.20)

## 2015-01-15 LAB — CBC WITH DIFFERENTIAL/PLATELET
BASO%: 0 % (ref 0.0–2.0)
Basophils Absolute: 0 10*3/uL (ref 0.0–0.1)
EOS%: 0.5 % (ref 0.0–7.0)
Eosinophils Absolute: 0 10*3/uL (ref 0.0–0.5)
HCT: 37.6 % (ref 34.8–46.6)
HGB: 13.2 g/dL (ref 11.6–15.9)
LYMPH%: 29.4 % (ref 14.0–49.7)
MCH: 32.4 pg (ref 25.1–34.0)
MCHC: 35.1 g/dL (ref 31.5–36.0)
MCV: 92.4 fL (ref 79.5–101.0)
MONO#: 0.1 10*3/uL (ref 0.1–0.9)
MONO%: 4.3 % (ref 0.0–14.0)
NEUT#: 1.2 10*3/uL — ABNORMAL LOW (ref 1.5–6.5)
NEUT%: 65.8 % (ref 38.4–76.8)
NRBC: 0 % (ref 0–0)
Platelets: 270 10*3/uL (ref 145–400)
RBC: 4.07 10*6/uL (ref 3.70–5.45)
RDW: 16.5 % — ABNORMAL HIGH (ref 11.2–14.5)
WBC: 1.9 10*3/uL — AB (ref 3.9–10.3)
lymph#: 0.6 10*3/uL — ABNORMAL LOW (ref 0.9–3.3)

## 2015-01-15 MED ORDER — HYDROCODONE-ACETAMINOPHEN 7.5-325 MG PO TABS: 1.0000 | ORAL_TABLET | Freq: Four times a day (QID) | ORAL | Status: DC | PRN

## 2015-01-15 MED ORDER — FULVESTRANT 250 MG/5ML IM SOLN
500.0000 mg | Freq: Once | INTRAMUSCULAR | Status: AC
  Administered 2015-01-15: 500 mg via INTRAMUSCULAR
  Filled 2015-01-15: qty 10

## 2015-01-15 NOTE — Progress Notes (Signed)
Stacy Horn OFFICE PROGRESS NOTE   Diagnosis:  Breast cancer  INTERVAL HISTORY:   Stacy Horn returns as scheduled. She resumed palbociclib at a reduced dose for 21 days beginning 01/04/2015. She denies nausea/vomiting. No mouth sores. No diarrhea. No rash. She notes less pain at the back and left hip. She takes hydrocodone as needed. She has intermittent hot flashes.  Objective:  Vital signs in last 24 hours:  Blood pressure 102/53, pulse 71, temperature 97.9 F (36.6 C), temperature source Oral, resp. rate 18, height _0  (1.651 m), weight 162 lb (73.483 kg), last menstrual period 10/08/2014, SpO2 99 %.    HEENT: No thrush or ulcers. Lymphatics: 1/2 cm mobile left axillary node. Resp: Lungs clear bilaterally. Cardio: Regular rate and rhythm. GI: Abdomen soft and nontender. No hepatomegaly. Vascular: No leg edema. Calves soft and nontender.     Lab Results:  Lab Results  Component Value Date   WBC 1.9* 01/15/2015   HGB 13.2 01/15/2015   HCT 37.6 01/15/2015   MCV 92.4 01/15/2015   PLT 270 01/15/2015   NEUTROABS 1.2* 01/15/2015    Imaging:  No results found.  Medications: I have reviewed the patient's current medications.  Assessment/Plan: 1. Multicentric invasive lobular carcinoma of the right breast diagnosed in December of 1999, a right mastectomy followed by adjuvant [facility] chemotherapy, 5 years of tamoxifen, and 5 years of Femara. The Femara was completed in June of 2010. The right breast mass excision in January 2000 was ER positive, PR positive, and HER-2 negative  2. Pain at the left groin area with a CT of the pelvis on 02/07/12 confirming a destructive lytic lesion at the left pubic symphysis, status post a CT-guided biopsy on 02/16/12 confirming metastatic carcinoma, focally ER positive .  -PET scan 03/07/2012 confirmed multiple hypermetabolic bone metastases with no other evidence of metastatic disease  -Status post palliative radiation to the  pelvis beginning on 03/12/2012  -Initiation of Arimidex on 03/27/2012 -initiation of every three-month xgeva on 04/04/2012  -PET scan September 14 [id number] new hypermetabolic bone lesions  -Initiation of Faslodex 09/27/2012  -Restaging PET scan 04/08/2013 with evidence of disease progression in the bones  -Initiation of Aromasin/afinitor 05/29/2013  -Iliac biopsy at [facility] 05/22/2013 confirming HER-2 negative metastatic breast  -PET scan 11/18/2013 with a decrease in hypermetabolic activity  -PET scan 10/08/2014 with progressive diffuse hypermetabolic bone metastases -Initiation of Palbociclib 10/20/2014; Faslodex 10/23/2014. -Palbociclib placed on hold beginning 11/07/2014 due to neutropenia. -Palbociclib resumed 11/27/2014 on an every other day schedule -Palbociclib resumed 01/04/2015 at a reduced dose of 100 mg daily for 21 days. 3. BRCA1 variant felt to be a polymorphism  4. Pain/tenderness at the left low anterolateral chest wall-she completed palliative radiation on 05/28/2012 with improvement in the pain. She was also treated with radiation to the thoracic spine.  5. Anemia/leukopenia-likely secondary to breast cancer involving the bone marrow and radiation /afinitor -the anemia is improved  6. History of mildly elevated liver enzymes-? Related to afinitor,? Secondary to metastatic breast cancer 7. Neutropenia secondary to Palbociclib     Disposition: Stacy Horn appears stable. The neutrophil count is unchanged since beginning the most recent cycle of Palbociclib on 01/04/2015. She will continue Palbociclib to complete 21 days which will be followed by a 7 day break. We will have her return on 01/30/2015 for a repeat CBC with plans to proceed with the next cycle of Palbociclib on 02/01/2016 as long as the counts are adequate. She continues monthly Faslodex and  will receive an injection today. She receives [place] every 3 months with the most recent given 11/20/2014.  She  will return for a follow-up visit and Faslodex injection on 02/12/2015. She will contact the office in the interim with any problems. She was given a new hydrocodone prescription at today's visit.  Plan reviewed with Dr. Benay Spice.    Ned Card ANP/GNP-BC   01/15/2015  11:16 AM

## 2015-01-15 NOTE — Telephone Encounter (Signed)
Gave avs & calendar for May. °

## 2015-01-26 ENCOUNTER — Telehealth: Payer: Self-pay

## 2015-01-26 DIAGNOSIS — C7952 Secondary malignant neoplasm of bone marrow: Secondary | ICD-10-CM

## 2015-01-26 DIAGNOSIS — C7951 Secondary malignant neoplasm of bone: Secondary | ICD-10-CM

## 2015-01-26 DIAGNOSIS — C50919 Malignant neoplasm of unspecified site of unspecified female breast: Secondary | ICD-10-CM

## 2015-01-26 MED ORDER — PALBOCICLIB 100 MG PO CAPS: 100.0000 mg | ORAL_CAPSULE | Freq: Every day | ORAL | Status: DC

## 2015-01-26 NOTE — Telephone Encounter (Signed)
Pt called stating that the fax # for [facility] is [phone] or [phone]. This is for her Stacy Horn Rx. S/w Amy and she stated Dr Benay Spice approved refill Stacy Horn. I called [facility] and confirmed to refilled per fax number in e-chart.

## 2015-01-29 ENCOUNTER — Telehealth: Payer: Self-pay | Admitting: *Deleted

## 2015-01-29 NOTE — Telephone Encounter (Signed)
VM message regarding clarification of Ibrance . Call back to [place] and spoke with pharmacy. Leslee Home is 100mg  tablet daily for 21 days and off for 7 days.  Pharmacy verbalized understanding and will get prescription sent out to pt asap.

## 2015-01-30 ENCOUNTER — Other Ambulatory Visit (HOSPITAL_BASED_OUTPATIENT_CLINIC_OR_DEPARTMENT_OTHER): Payer: 59

## 2015-01-30 ENCOUNTER — Telehealth: Payer: Self-pay

## 2015-01-30 ENCOUNTER — Telehealth: Payer: Self-pay | Admitting: *Deleted

## 2015-01-30 DIAGNOSIS — C50911 Malignant neoplasm of unspecified site of right female breast: Secondary | ICD-10-CM

## 2015-01-30 DIAGNOSIS — C7951 Secondary malignant neoplasm of bone: Secondary | ICD-10-CM

## 2015-01-30 DIAGNOSIS — C50919 Malignant neoplasm of unspecified site of unspecified female breast: Secondary | ICD-10-CM

## 2015-01-30 LAB — CBC WITH DIFFERENTIAL/PLATELET
BASO%: 1.3 % (ref 0.0–2.0)
Basophils Absolute: 0 10*3/uL (ref 0.0–0.1)
EOS ABS: 0 10*3/uL (ref 0.0–0.5)
EOS%: 0.7 % (ref 0.0–7.0)
HCT: 36.2 % (ref 34.8–46.6)
HGB: 12.7 g/dL (ref 11.6–15.9)
LYMPH%: 43.1 % (ref 14.0–49.7)
MCH: 33.1 pg (ref 25.1–34.0)
MCHC: 35.1 g/dL (ref 31.5–36.0)
MCV: 94.3 fL (ref 79.5–101.0)
MONO#: 0.2 10*3/uL (ref 0.1–0.9)
MONO%: 11.1 % (ref 0.0–14.0)
NEUT%: 43.8 % (ref 38.4–76.8)
NEUTROS ABS: 0.7 10*3/uL — AB (ref 1.5–6.5)
PLATELETS: 126 10*3/uL — AB (ref 145–400)
RBC: 3.84 10*6/uL (ref 3.70–5.45)
RDW: 16 % — ABNORMAL HIGH (ref 11.2–14.5)
WBC: 1.5 10*3/uL — ABNORMAL LOW (ref 3.9–10.3)
lymph#: 0.7 10*3/uL — ABNORMAL LOW (ref 0.9–3.3)

## 2015-01-30 NOTE — Telephone Encounter (Signed)
Called and informed patient to hold palbociclib.  Per Dr. Benay Spice.  Patient verbalized understanding

## 2015-01-30 NOTE — Telephone Encounter (Signed)
Opened in error

## 2015-01-30 NOTE — Telephone Encounter (Signed)
ibrance refill request to Dr Benay Spice

## 2015-01-30 NOTE — Telephone Encounter (Signed)
-----   Message from Ladell Pier, MD sent at 01/30/2015  3:35 PM EDT ----- Please call patient, hold palbociclib, repeat cbc 1 week

## 2015-02-02 ENCOUNTER — Telehealth: Payer: Self-pay | Admitting: Nurse Practitioner

## 2015-02-02 NOTE — Telephone Encounter (Signed)
Patient aware of her lab 5/20

## 2015-02-06 ENCOUNTER — Telehealth: Payer: Self-pay | Admitting: *Deleted

## 2015-02-06 ENCOUNTER — Other Ambulatory Visit (HOSPITAL_BASED_OUTPATIENT_CLINIC_OR_DEPARTMENT_OTHER): Payer: 59

## 2015-02-06 DIAGNOSIS — C50911 Malignant neoplasm of unspecified site of right female breast: Secondary | ICD-10-CM

## 2015-02-06 DIAGNOSIS — C50919 Malignant neoplasm of unspecified site of unspecified female breast: Secondary | ICD-10-CM

## 2015-02-06 DIAGNOSIS — C7951 Secondary malignant neoplasm of bone: Principal | ICD-10-CM

## 2015-02-06 LAB — CBC WITH DIFFERENTIAL/PLATELET
BASO%: 1 % (ref 0.0–2.0)
Basophils Absolute: 0 10*3/uL (ref 0.0–0.1)
EOS%: 0.5 % (ref 0.0–7.0)
Eosinophils Absolute: 0 10*3/uL (ref 0.0–0.5)
HCT: 37.5 % (ref 34.8–46.6)
HGB: 13.2 g/dL (ref 11.6–15.9)
LYMPH%: 35.9 % (ref 14.0–49.7)
MCH: 33.4 pg (ref 25.1–34.0)
MCHC: 35.2 g/dL (ref 31.5–36.0)
MCV: 94.9 fL (ref 79.5–101.0)
MONO#: 0.2 10*3/uL (ref 0.1–0.9)
MONO%: 11.5 % (ref 0.0–14.0)
NEUT#: 1.1 10*3/uL — ABNORMAL LOW (ref 1.5–6.5)
NEUT%: 51.1 % (ref 38.4–76.8)
PLATELETS: 172 10*3/uL (ref 145–400)
RBC: 3.95 10*6/uL (ref 3.70–5.45)
RDW: 15.4 % — ABNORMAL HIGH (ref 11.2–14.5)
WBC: 2.1 10*3/uL — ABNORMAL LOW (ref 3.9–10.3)
lymph#: 0.8 10*3/uL — ABNORMAL LOW (ref 0.9–3.3)

## 2015-02-06 NOTE — Telephone Encounter (Signed)
Received refill request for Ibrance from [facility]. Pt has enough for this month's supply (Refilled 5/9). Pt was instructed to hold Ibrance due to low counts. Will reorder after office visit 5/26.

## 2015-02-06 NOTE — Telephone Encounter (Signed)
Called pt with instructions to hold Ibrance until office visit. She voiced understanding.

## 2015-02-12 ENCOUNTER — Other Ambulatory Visit (HOSPITAL_BASED_OUTPATIENT_CLINIC_OR_DEPARTMENT_OTHER): Payer: 59

## 2015-02-12 ENCOUNTER — Ambulatory Visit (HOSPITAL_BASED_OUTPATIENT_CLINIC_OR_DEPARTMENT_OTHER): Payer: 59

## 2015-02-12 ENCOUNTER — Telehealth: Payer: Self-pay | Admitting: Nurse Practitioner

## 2015-02-12 ENCOUNTER — Ambulatory Visit (HOSPITAL_BASED_OUTPATIENT_CLINIC_OR_DEPARTMENT_OTHER): Payer: 59 | Admitting: Nurse Practitioner

## 2015-02-12 VITALS — BP 111/63 | HR 65 | Temp 98.7°F | Resp 18 | Ht 65.0 in | Wt 164.1 lb

## 2015-02-12 DIAGNOSIS — C7951 Secondary malignant neoplasm of bone: Secondary | ICD-10-CM | POA: Diagnosis not present

## 2015-02-12 DIAGNOSIS — Z9889 Other specified postprocedural states: Secondary | ICD-10-CM

## 2015-02-12 DIAGNOSIS — C50919 Malignant neoplasm of unspecified site of unspecified female breast: Secondary | ICD-10-CM

## 2015-02-12 DIAGNOSIS — C50911 Malignant neoplasm of unspecified site of right female breast: Secondary | ICD-10-CM

## 2015-02-12 DIAGNOSIS — Z5111 Encounter for antineoplastic chemotherapy: Secondary | ICD-10-CM | POA: Diagnosis not present

## 2015-02-12 LAB — CBC WITH DIFFERENTIAL/PLATELET
BASO%: 0.6 % (ref 0.0–2.0)
Basophils Absolute: 0 10*3/uL (ref 0.0–0.1)
EOS%: 0.3 % (ref 0.0–7.0)
Eosinophils Absolute: 0 10*3/uL (ref 0.0–0.5)
HEMATOCRIT: 39 % (ref 34.8–46.6)
HGB: 13.5 g/dL (ref 11.6–15.9)
LYMPH%: 24.5 % (ref 14.0–49.7)
MCH: 32.9 pg (ref 25.1–34.0)
MCHC: 34.6 g/dL (ref 31.5–36.0)
MCV: 95.1 fL (ref 79.5–101.0)
MONO#: 0.3 10*3/uL (ref 0.1–0.9)
MONO%: 9.8 % (ref 0.0–14.0)
NEUT#: 2.3 10*3/uL (ref 1.5–6.5)
NEUT%: 64.8 % (ref 38.4–76.8)
PLATELETS: 259 10*3/uL (ref 145–400)
RBC: 4.1 10*6/uL (ref 3.70–5.45)
RDW: 14.9 % — ABNORMAL HIGH (ref 11.2–14.5)
WBC: 3.5 10*3/uL — AB (ref 3.9–10.3)
lymph#: 0.9 10*3/uL (ref 0.9–3.3)

## 2015-02-12 LAB — COMPREHENSIVE METABOLIC PANEL (CC13)
ALT: 30 U/L (ref 0–55)
AST: 34 U/L (ref 5–34)
Albumin: 4.1 g/dL (ref 3.5–5.0)
Alkaline Phosphatase: 60 U/L (ref 40–150)
Anion Gap: 11 mEq/L (ref 3–11)
BUN: 14.9 mg/dL (ref 7.0–26.0)
CO2: 26 meq/L (ref 22–29)
Calcium: 9.8 mg/dL (ref 8.4–10.4)
Chloride: 103 mEq/L (ref 98–109)
Creatinine: 0.8 mg/dL (ref 0.6–1.1)
EGFR: 82 mL/min/{1.73_m2} — ABNORMAL LOW (ref >90)
GLUCOSE: 88 mg/dL (ref 70–140)
Potassium: 4.2 mEq/L (ref 3.5–5.1)
Sodium: 140 mEq/L (ref 136–145)
Total Bilirubin: 0.53 mg/dL (ref 0.20–1.20)
Total Protein: 7.4 g/dL (ref 6.4–8.3)

## 2015-02-12 MED ORDER — FULVESTRANT 250 MG/5ML IM SOLN
500.0000 mg | Freq: Once | INTRAMUSCULAR | Status: AC
  Administered 2015-02-12: 500 mg via INTRAMUSCULAR
  Filled 2015-02-12: qty 10

## 2015-02-12 MED ORDER — DENOSUMAB 120 MG/1.7ML ~~LOC~~ SOLN
120.0000 mg | Freq: Once | SUBCUTANEOUS | Status: AC
  Administered 2015-02-12: 120 mg via SUBCUTANEOUS
  Filled 2015-02-12: qty 1.7

## 2015-02-12 MED ORDER — HYDROCODONE-ACETAMINOPHEN 7.5-325 MG PO TABS: 1.0000 | ORAL_TABLET | Freq: Four times a day (QID) | ORAL | Status: DC | PRN

## 2015-02-12 NOTE — Progress Notes (Addendum)
[place] OFFICE PROGRESS NOTE   Diagnosis:  Breast cancer  INTERVAL HISTORY:   Stacy Horn returns as scheduled. She began the most recent cycle of palbociclib on 01/04/2015. She was scheduled to begin the next cycle on 02/01/2015. Palbociclib has been on hold due to neutropenia.  She denies nausea/vomiting. No mouth sores. No diarrhea. No rash. Left hip pain is better. She notes intermittent pain at the right hip. No back pain today. She notes less hot flashes.  Objective:  Vital signs in last 24 hours:  Blood pressure 111/63, pulse 65, temperature 98.7 F (37.1 C), temperature source Oral, resp. rate 18, height '5\' 5"'  (1.651 m), weight 164 lb 1.6 oz (74.435 kg), last menstrual period 10/08/2014, SpO2 100 %.    HEENT: No thrush or ulcers. Lymphatics: Soft, mobile less than 1/2 cm left axillary lymph node. Resp: Lungs clear bilaterally. Cardio: Regular rate and rhythm. GI: Abdomen soft and nontender. Hepatomegaly. Vascular: No leg edema. Calves soft and nontender.     Lab Results:  Lab Results  Component Value Date   WBC 3.5* 02/12/2015   HGB 13.5 02/12/2015   HCT 39.0 02/12/2015   MCV 95.1 02/12/2015   PLT 259 02/12/2015   NEUTROABS 2.3 02/12/2015    Imaging:  No results found.  Medications: I have reviewed the patient's current medications.  Assessment/Plan: 1. Multicentric invasive lobular carcinoma of the right breast diagnosed in December of 1999, a right mastectomy followed by adjuvant [facility] chemotherapy, 5 years of tamoxifen, and 5 years of Femara. The Femara was completed in June of 2010. The right breast mass excision in January 2000 was ER positive, PR positive, and HER-2 negative  2. Pain at the left groin area with a CT of the pelvis on 02/07/12 confirming a destructive lytic lesion at the left pubic symphysis, status post a CT-guided biopsy on 02/16/12 confirming metastatic carcinoma, focally ER positive .  -PET scan 03/07/2012 confirmed  multiple hypermetabolic bone metastases with no other evidence of metastatic disease  -Status post palliative radiation to the pelvis beginning on 03/12/2012  -Initiation of Arimidex on 03/27/2012 -initiation of every three-month xgeva on 04/04/2012  -PET scan September 14 [id number] new hypermetabolic bone lesions  -Initiation of Faslodex 09/27/2012  -Restaging PET scan 04/08/2013 with evidence of disease progression in the bones  -Initiation of Aromasin/afinitor 05/29/2013  -Iliac biopsy at [facility] 05/22/2013 confirming HER-2 negative metastatic breast  -PET scan 11/18/2013 with a decrease in hypermetabolic activity  -PET scan 10/08/2014 with progressive diffuse hypermetabolic bone metastases -Initiation of Palbociclib 10/20/2014; Faslodex 10/23/2014. -Palbociclib placed on hold beginning 11/07/2014 due to neutropenia. -Palbociclib resumed 11/27/2014 on an every other day schedule -Palbociclib resumed 01/04/2015 at a reduced dose of 100 mg daily for 21 days. -Palbociclib beginning 02/12/2015 at a dose of 100 mg daily for 14 days on/14 days off. 3. BRCA1 variant felt to be a polymorphism  4. Pain/tenderness at the left low anterolateral chest wall-she completed palliative radiation on 05/28/2012 with improvement in the pain. She was also treated with radiation to the thoracic spine.  5. Anemia/leukopenia-likely secondary to breast cancer involving the bone marrow and radiation /afinitor -the anemia is improved  6. History of mildly elevated liver enzymes-? Related to afinitor,? Secondary to metastatic breast cancer 7. Neutropenia secondary to Palbociclib. Improved.   Disposition: Stacy Horn appears stable. The neutrophil count is better. She will proceed with the next cycle of palbociclib today. The schedule will be adjusted to 14 days on/14 days off. She will  receive Faslodex and Xgeva today. She will return for a follow-up visit in 4 weeks. She will contact the office in the  interim with any problems. We specifically discussed fever, chills, other signs of infection.   She was issued a new hydrocodone prescription at today's visit.  Patient seen with Dr. Benay Spice.  Ned Card ANP/GNP-BC   02/12/2015  10:58 AM  This was a shared visit with Ned Card. Stacy Horn appears stable. We adjusted the palbciclib schedule secondary to neutropenia.  Stacy Horn, M.D.

## 2015-02-12 NOTE — Telephone Encounter (Signed)
Pt confirmed labs/ov per 05/26 POF, gave pt AVS and Calendar..... KJ °

## 2015-02-17 ENCOUNTER — Telehealth: Payer: Self-pay | Admitting: *Deleted

## 2015-02-17 NOTE — Telephone Encounter (Signed)
[facility] faxed Ibrance refill request.  Request to provider's desk/in-basket for review.

## 2015-02-19 ENCOUNTER — Other Ambulatory Visit: Payer: Self-pay | Admitting: *Deleted

## 2015-02-19 ENCOUNTER — Telehealth: Payer: Self-pay | Admitting: *Deleted

## 2015-02-19 DIAGNOSIS — C7952 Secondary malignant neoplasm of bone marrow: Secondary | ICD-10-CM

## 2015-02-19 DIAGNOSIS — C7951 Secondary malignant neoplasm of bone: Secondary | ICD-10-CM

## 2015-02-19 DIAGNOSIS — C50919 Malignant neoplasm of unspecified site of unspecified female breast: Secondary | ICD-10-CM

## 2015-02-19 MED ORDER — PALBOCICLIB 100 MG PO CAPS
100.0000 mg | ORAL_CAPSULE | Freq: Every day | ORAL | Status: DC
Start: 2015-02-19 — End: 2015-03-13

## 2015-02-19 NOTE — Telephone Encounter (Signed)
VM message received @ 2:24 pm regarding clarification of IBRANCE instructions.  Call back to pharmacist @ [facility] to clarify.  Unable to leave VM

## 2015-03-02 ENCOUNTER — Telehealth: Payer: Self-pay | Admitting: *Deleted

## 2015-03-02 NOTE — Telephone Encounter (Signed)
Pray, confirmed pt is on monthly Faslodex with her Ibrance.

## 2015-03-12 ENCOUNTER — Ambulatory Visit (HOSPITAL_BASED_OUTPATIENT_CLINIC_OR_DEPARTMENT_OTHER): Payer: 59

## 2015-03-12 ENCOUNTER — Telehealth: Payer: Self-pay | Admitting: Oncology

## 2015-03-12 ENCOUNTER — Other Ambulatory Visit (HOSPITAL_BASED_OUTPATIENT_CLINIC_OR_DEPARTMENT_OTHER): Payer: 59

## 2015-03-12 ENCOUNTER — Other Ambulatory Visit: Payer: Self-pay | Admitting: *Deleted

## 2015-03-12 ENCOUNTER — Ambulatory Visit (HOSPITAL_BASED_OUTPATIENT_CLINIC_OR_DEPARTMENT_OTHER): Payer: 59 | Admitting: Oncology

## 2015-03-12 VITALS — BP 118/70 | HR 66 | Temp 98.7°F | Resp 18 | Ht 65.0 in | Wt 165.0 lb

## 2015-03-12 DIAGNOSIS — D649 Anemia, unspecified: Secondary | ICD-10-CM

## 2015-03-12 DIAGNOSIS — C7951 Secondary malignant neoplasm of bone: Secondary | ICD-10-CM

## 2015-03-12 DIAGNOSIS — C50911 Malignant neoplasm of unspecified site of right female breast: Secondary | ICD-10-CM | POA: Diagnosis not present

## 2015-03-12 DIAGNOSIS — C50919 Malignant neoplasm of unspecified site of unspecified female breast: Secondary | ICD-10-CM

## 2015-03-12 DIAGNOSIS — Z9889 Other specified postprocedural states: Secondary | ICD-10-CM

## 2015-03-12 DIAGNOSIS — Z5111 Encounter for antineoplastic chemotherapy: Secondary | ICD-10-CM | POA: Diagnosis not present

## 2015-03-12 DIAGNOSIS — D702 Other drug-induced agranulocytosis: Secondary | ICD-10-CM | POA: Diagnosis not present

## 2015-03-12 LAB — CBC WITH DIFFERENTIAL/PLATELET
BASO%: 0.9 % (ref 0.0–2.0)
Basophils Absolute: 0 10*3/uL (ref 0.0–0.1)
EOS ABS: 0 10*3/uL (ref 0.0–0.5)
EOS%: 0.9 % (ref 0.0–7.0)
HEMATOCRIT: 38.5 % (ref 34.8–46.6)
HGB: 13.3 g/dL (ref 11.6–15.9)
LYMPH%: 32.4 % (ref 14.0–49.7)
MCH: 33.3 pg (ref 25.1–34.0)
MCHC: 34.6 g/dL (ref 31.5–36.0)
MCV: 96.2 fL (ref 79.5–101.0)
MONO#: 0.3 10*3/uL (ref 0.1–0.9)
MONO%: 11.6 % (ref 0.0–14.0)
NEUT#: 1.4 10*3/uL — ABNORMAL LOW (ref 1.5–6.5)
NEUT%: 54.2 % (ref 38.4–76.8)
PLATELETS: 156 10*3/uL (ref 145–400)
RBC: 4 10*6/uL (ref 3.70–5.45)
RDW: 15.8 % — ABNORMAL HIGH (ref 11.2–14.5)
WBC: 2.5 10*3/uL — AB (ref 3.9–10.3)
lymph#: 0.8 10*3/uL — ABNORMAL LOW (ref 0.9–3.3)

## 2015-03-12 LAB — COMPREHENSIVE METABOLIC PANEL (CC13)
ALT: 28 U/L (ref 0–55)
AST: 34 U/L (ref 5–34)
Albumin: 4.1 g/dL (ref 3.5–5.0)
Alkaline Phosphatase: 62 U/L (ref 40–150)
Anion Gap: 6 mEq/L (ref 3–11)
BILIRUBIN TOTAL: 0.62 mg/dL (ref 0.20–1.20)
BUN: 14.6 mg/dL (ref 7.0–26.0)
CHLORIDE: 103 meq/L (ref 98–109)
CO2: 29 mEq/L (ref 22–29)
CREATININE: 0.8 mg/dL (ref 0.6–1.1)
Calcium: 10.5 mg/dL — ABNORMAL HIGH (ref 8.4–10.4)
EGFR: 76 mL/min/{1.73_m2} — ABNORMAL LOW (ref >90)
Glucose: 84 mg/dl (ref 70–140)
Potassium: 4.3 mEq/L (ref 3.5–5.1)
Sodium: 139 mEq/L (ref 136–145)
Total Protein: 7.3 g/dL (ref 6.4–8.3)

## 2015-03-12 MED ORDER — DENOSUMAB 120 MG/1.7ML ~~LOC~~ SOLN
120.0000 mg | Freq: Once | SUBCUTANEOUS | Status: DC
Start: 2015-03-12 — End: 2015-03-12

## 2015-03-12 MED ORDER — HYDROCODONE-ACETAMINOPHEN 7.5-325 MG PO TABS: 1.0000 | ORAL_TABLET | Freq: Four times a day (QID) | ORAL | Status: DC | PRN

## 2015-03-12 MED ORDER — FULVESTRANT 250 MG/5ML IM SOLN
500.0000 mg | INTRAMUSCULAR | Status: DC
  Administered 2015-03-12: 500 mg via INTRAMUSCULAR
  Filled 2015-03-12: qty 10

## 2015-03-12 NOTE — Telephone Encounter (Signed)
Gave and printed appt sched and avs for pt for July  °

## 2015-03-12 NOTE — Progress Notes (Signed)
[place] OFFICE PROGRESS NOTE   Diagnosis: Breast cancer  INTERVAL HISTORY:   Stacy Horn returns as scheduled. She began another cycle of palbociclib on 02/12/2015. She completed a 2 week course. No apparent side effect from the palbociclib. She has intermittent pain in various sites, but in general the pain has improved. She has pain with movement at the left shoulder. This feels similar to right shoulder pain she experienced in the past. Good appetite.  Objective:  Vital signs in last 24 hours:  Blood pressure 118/70, pulse 66, temperature 98.7 F (37.1 C), temperature source Oral, resp. rate 18, height '5\' 5"'  (1.651 m), weight 165 lb (74.844 kg), last menstrual period 10/08/2014, SpO2 100 %.    HEENT: Neck without mass Lymphatics: No cervical or supra-clavicular nodes. Pea-sized mobile left axillary node. Resp: Lungs clear bilaterally Cardio: Regular rate and rhythm GI: No hepatomegaly Vascular: No leg edema Musculoskeletal: Pain with abduction at the left shoulder     Portacath/PICC-without erythema  Lab Results:  Lab Results  Component Value Date   WBC 2.5* 03/12/2015   HGB 13.3 03/12/2015   HCT 38.5 03/12/2015   MCV 96.2 03/12/2015   PLT 156 03/12/2015   NEUTROABS 1.4* 03/12/2015     Medications: I have reviewed the patient's current medications.  Assessment/Plan: 1. Multicentric invasive lobular carcinoma of the right breast diagnosed in December of 1999, a right mastectomy followed by adjuvant [facility] chemotherapy, 5 years of tamoxifen, and 5 years of Femara. The Femara was completed in June of 2010. The right breast mass excision in January 2000 was ER positive, PR positive, and HER-2 negative  2. Pain at the left groin area with a CT of the pelvis on 02/07/12 confirming a destructive lytic lesion at the left pubic symphysis, status post a CT-guided biopsy on 02/16/12 confirming metastatic carcinoma, focally ER positive .  -PET scan 03/07/2012  confirmed multiple hypermetabolic bone metastases with no other evidence of metastatic disease  -Status post palliative radiation to the pelvis beginning on 03/12/2012  -Initiation of Arimidex on 03/27/2012 -initiation of every three-month xgeva on 04/04/2012  -PET scan September 14 [id number] new hypermetabolic bone lesions  -Initiation of Faslodex 09/27/2012  -Restaging PET scan 04/08/2013 with evidence of disease progression in the bones  -Initiation of Aromasin/afinitor 05/29/2013  -Iliac biopsy at [name] 05/22/2013 confirming HER-2 negative metastatic breast  -PET scan 11/18/2013 with a decrease in hypermetabolic activity  -PET scan 10/08/2014 with progressive diffuse hypermetabolic bone metastases -Initiation of Palbociclib 10/20/2014; Faslodex 10/23/2014. -Palbociclib placed on hold beginning 11/07/2014 due to neutropenia. -Palbociclib resumed 11/27/2014 on an every other day schedule -Palbociclib resumed 01/04/2015 at a reduced dose of 100 mg daily for 21 days. -Palbociclib beginning 02/12/2015 at a dose of 100 mg daily for 14 days on/14 days off. 3. BRCA1 variant felt to be a polymorphism  4. Pain/tenderness at the left low anterolateral chest wall-she completed palliative radiation on 05/28/2012 with improvement in the pain. She was also treated with radiation to the thoracic spine.  5. Anemia/leukopenia-likely secondary to breast cancer involving the bone marrow and radiation /afinitor -the anemia is improved  6. History of mildly elevated liver enzymes-? Related to afinitor,? Secondary to metastatic breast cancer 7. Neutropenia secondary to Palbociclib. Improved.    Disposition:  Stacy Horn appears stable. The plan is to continue palbociclib on a 2 week on/two-week off schedule. She continues monthly Faslodex. She will return for a CBC on 03/30/2015 and an office visit on 04/09/2015. She continues every  three-month xgeva. She will contact us for a fever or symptoms of  an infection.  We will schedule a restaging PET scan in the next 2-3 months.  [name], MD  03/12/2015  10:24 AM

## 2015-03-13 ENCOUNTER — Other Ambulatory Visit: Payer: Self-pay | Admitting: *Deleted

## 2015-03-13 DIAGNOSIS — C7952 Secondary malignant neoplasm of bone marrow: Secondary | ICD-10-CM

## 2015-03-13 DIAGNOSIS — C7951 Secondary malignant neoplasm of bone: Secondary | ICD-10-CM

## 2015-03-13 DIAGNOSIS — C50919 Malignant neoplasm of unspecified site of unspecified female breast: Secondary | ICD-10-CM

## 2015-03-13 MED ORDER — PALBOCICLIB 100 MG PO CAPS: 100.0000 mg | ORAL_CAPSULE | Freq: Every day | ORAL | Status: DC

## 2015-03-30 ENCOUNTER — Other Ambulatory Visit (HOSPITAL_BASED_OUTPATIENT_CLINIC_OR_DEPARTMENT_OTHER): Payer: 59

## 2015-03-30 DIAGNOSIS — C50919 Malignant neoplasm of unspecified site of unspecified female breast: Secondary | ICD-10-CM

## 2015-03-30 DIAGNOSIS — C50911 Malignant neoplasm of unspecified site of right female breast: Secondary | ICD-10-CM | POA: Diagnosis not present

## 2015-03-30 DIAGNOSIS — C7951 Secondary malignant neoplasm of bone: Principal | ICD-10-CM

## 2015-03-30 LAB — CBC WITH DIFFERENTIAL/PLATELET
BASO%: 0.2 % (ref 0.0–2.0)
Basophils Absolute: 0 10*3/uL (ref 0.0–0.1)
EOS%: 1.4 % (ref 0.0–7.0)
Eosinophils Absolute: 0 10*3/uL (ref 0.0–0.5)
HCT: 34.7 % — ABNORMAL LOW (ref 34.8–46.6)
HGB: 12.2 g/dL (ref 11.6–15.9)
LYMPH%: 36.1 % (ref 14.0–49.7)
MCH: 34.1 pg — ABNORMAL HIGH (ref 25.1–34.0)
MCHC: 35.2 g/dL (ref 31.5–36.0)
MCV: 96.8 fL (ref 79.5–101.0)
MONO#: 0.1 10*3/uL (ref 0.1–0.9)
MONO%: 6.1 % (ref 0.0–14.0)
NEUT#: 0.8 10*3/uL — ABNORMAL LOW (ref 1.5–6.5)
NEUT%: 56.2 % (ref 38.4–76.8)
PLATELETS: 229 10*3/uL (ref 145–400)
RBC: 3.58 10*6/uL — ABNORMAL LOW (ref 3.70–5.45)
RDW: 15.8 % — ABNORMAL HIGH (ref 11.2–14.5)
WBC: 1.4 10*3/uL — ABNORMAL LOW (ref 3.9–10.3)
lymph#: 0.5 10*3/uL — ABNORMAL LOW (ref 0.9–3.3)

## 2015-03-31 ENCOUNTER — Telehealth: Payer: Self-pay | Admitting: *Deleted

## 2015-03-31 NOTE — Telephone Encounter (Signed)
-----   Message from Ladell Pier, MD sent at 03/30/2015  8:39 PM EDT ----- Please call patient, white count is low, where is she on current cycle of ibrance

## 2015-03-31 NOTE — Telephone Encounter (Signed)
Called pt, she completed 2 weeks of Ibrance on 7/9. Will be off for 2 weeks. Neutropenic precautions discussed. Pt will call the office for signs of infection. Next appointment 7/21.

## 2015-04-09 ENCOUNTER — Ambulatory Visit (HOSPITAL_BASED_OUTPATIENT_CLINIC_OR_DEPARTMENT_OTHER): Payer: 59

## 2015-04-09 ENCOUNTER — Telehealth: Payer: Self-pay | Admitting: Oncology

## 2015-04-09 ENCOUNTER — Other Ambulatory Visit: Payer: Self-pay | Admitting: *Deleted

## 2015-04-09 ENCOUNTER — Other Ambulatory Visit (HOSPITAL_BASED_OUTPATIENT_CLINIC_OR_DEPARTMENT_OTHER): Payer: 59

## 2015-04-09 ENCOUNTER — Other Ambulatory Visit: Payer: Self-pay | Admitting: Oncology

## 2015-04-09 ENCOUNTER — Ambulatory Visit (HOSPITAL_BASED_OUTPATIENT_CLINIC_OR_DEPARTMENT_OTHER): Payer: 59 | Admitting: Oncology

## 2015-04-09 VITALS — BP 121/64 | HR 65 | Temp 97.7°F | Resp 18 | Ht 65.0 in | Wt 166.3 lb

## 2015-04-09 DIAGNOSIS — C7951 Secondary malignant neoplasm of bone: Secondary | ICD-10-CM

## 2015-04-09 DIAGNOSIS — C50919 Malignant neoplasm of unspecified site of unspecified female breast: Secondary | ICD-10-CM

## 2015-04-09 DIAGNOSIS — C50911 Malignant neoplasm of unspecified site of right female breast: Secondary | ICD-10-CM

## 2015-04-09 DIAGNOSIS — Z5111 Encounter for antineoplastic chemotherapy: Secondary | ICD-10-CM | POA: Diagnosis not present

## 2015-04-09 DIAGNOSIS — Z9889 Other specified postprocedural states: Secondary | ICD-10-CM

## 2015-04-09 DIAGNOSIS — C7952 Secondary malignant neoplasm of bone marrow: Secondary | ICD-10-CM

## 2015-04-09 LAB — CBC WITH DIFFERENTIAL/PLATELET
BASO%: 0.8 % (ref 0.0–2.0)
Basophils Absolute: 0 10*3/uL (ref 0.0–0.1)
EOS ABS: 0 10*3/uL (ref 0.0–0.5)
EOS%: 0.8 % (ref 0.0–7.0)
HEMATOCRIT: 36.7 % (ref 34.8–46.6)
HEMOGLOBIN: 12.9 g/dL (ref 11.6–15.9)
LYMPH#: 0.8 10*3/uL — AB (ref 0.9–3.3)
LYMPH%: 32.1 % (ref 14.0–49.7)
MCH: 33.9 pg (ref 25.1–34.0)
MCHC: 35.1 g/dL (ref 31.5–36.0)
MCV: 96.3 fL (ref 79.5–101.0)
MONO#: 0.4 10*3/uL (ref 0.1–0.9)
MONO%: 16.1 % — AB (ref 0.0–14.0)
NEUT#: 1.3 10*3/uL — ABNORMAL LOW (ref 1.5–6.5)
NEUT%: 50.2 % (ref 38.4–76.8)
PLATELETS: 125 10*3/uL — AB (ref 145–400)
RBC: 3.81 10*6/uL (ref 3.70–5.45)
RDW: 14.9 % — ABNORMAL HIGH (ref 11.2–14.5)
WBC: 2.5 10*3/uL — ABNORMAL LOW (ref 3.9–10.3)

## 2015-04-09 LAB — COMPREHENSIVE METABOLIC PANEL (CC13)
ALBUMIN: 4.1 g/dL (ref 3.5–5.0)
ALT: 28 U/L (ref 0–55)
ANION GAP: 7 meq/L (ref 3–11)
AST: 38 U/L — ABNORMAL HIGH (ref 5–34)
Alkaline Phosphatase: 63 U/L (ref 40–150)
BILIRUBIN TOTAL: 0.56 mg/dL (ref 0.20–1.20)
BUN: 16.7 mg/dL (ref 7.0–26.0)
CALCIUM: 10.5 mg/dL — AB (ref 8.4–10.4)
CHLORIDE: 105 meq/L (ref 98–109)
CO2: 29 mEq/L (ref 22–29)
CREATININE: 0.8 mg/dL (ref 0.6–1.1)
EGFR: 85 mL/min/{1.73_m2} — ABNORMAL LOW (ref >90)
GLUCOSE: 86 mg/dL (ref 70–140)
POTASSIUM: 4.2 meq/L (ref 3.5–5.1)
Sodium: 140 mEq/L (ref 136–145)
Total Protein: 7.4 g/dL (ref 6.4–8.3)

## 2015-04-09 MED ORDER — FULVESTRANT 250 MG/5ML IM SOLN
500.0000 mg | INTRAMUSCULAR | Status: DC
Start: 2015-04-09 — End: 2015-04-09
  Administered 2015-04-09: 500 mg via INTRAMUSCULAR
  Filled 2015-04-09: qty 10

## 2015-04-09 MED ORDER — PALBOCICLIB 100 MG PO CAPS: 100.0000 mg | ORAL_CAPSULE | Freq: Every day | ORAL | Status: DC

## 2015-04-09 MED ORDER — HYDROCODONE-ACETAMINOPHEN 7.5-325 MG PO TABS: 1.0000 | ORAL_TABLET | Freq: Four times a day (QID) | ORAL | Status: DC | PRN

## 2015-04-09 MED ORDER — DENOSUMAB 120 MG/1.7ML ~~LOC~~ SOLN: 120.0000 mg | Freq: Once | SUBCUTANEOUS | Status: DC

## 2015-04-09 NOTE — Progress Notes (Signed)
[place] OFFICE PROGRESS NOTE   Diagnosis: Breast cancer  INTERVAL HISTORY:   Stacy Horn returns as scheduled. She continues Ibrance. Good appetite and energy level. She has persistent left shoulder discomfort with certain movements. She has new pain at the right lower posterior chest wall. No tenderness. She takes hydrocodone several times per day for relief of pain.  Objective:  Vital signs in last 24 hours:  Blood pressure 121/64, pulse 65, temperature 97.7 F (36.5 C), temperature source Oral, resp. rate 18, height _0  (1.651 m), weight 166 lb 4.8 oz (75.433 kg), last menstrual period 10/08/2014, SpO2 100 %.    HEENT: Neck without mass Lymphatics: No cervical or supra-clavicular nodes. Pea-sized mobile left axillary nodes Resp: Lungs clear bilaterally Cardio: Regular rate and rhythm GI: No hepatomegaly Vascular: No leg edema Musculoskeletal: No tenderness at the right posterior chest wall. Pain with abduction and posterior extension at the left shoulder    Portacath/PICC-without erythema  Lab Results:  Lab Results  Component Value Date   WBC 2.5* 04/09/2015   HGB 12.9 04/09/2015   HCT 36.7 04/09/2015   MCV 96.3 04/09/2015   PLT 125* 04/09/2015   NEUTROABS 1.3* 04/09/2015     Medications: I have reviewed the patient's current medications.  Assessment/Plan: 1. Multicentric invasive lobular carcinoma of the right breast diagnosed in December of 1999, a right mastectomy followed by adjuvant [facility] chemotherapy, 5 years of tamoxifen, and 5 years of Femara. The Femara was completed in June of 2010. The right breast mass excision in January 2000 was ER positive, PR positive, and HER-2 negative  2. Pain at the left groin area with a CT of the pelvis on 02/07/12 confirming a destructive lytic lesion at the left pubic symphysis, status post a CT-guided biopsy on 02/16/12 confirming metastatic carcinoma, focally ER positive .  -PET scan 03/07/2012 confirmed  multiple hypermetabolic bone metastases with no other evidence of metastatic disease  -Status post palliative radiation to the pelvis beginning on 03/12/2012  -Initiation of Arimidex on 03/27/2012 -initiation of every three-month xgeva on 04/04/2012  -PET scan September 14 [id number] new hypermetabolic bone lesions  -Initiation of Faslodex 09/27/2012  -Restaging PET scan 04/08/2013 with evidence of disease progression in the bones  -Initiation of Aromasin/afinitor 05/29/2013  -Iliac biopsy at [facility] 05/22/2013 confirming HER-2 negative metastatic breast  -PET scan 11/18/2013 with a decrease in hypermetabolic activity  -PET scan 10/08/2014 with progressive diffuse hypermetabolic bone metastases -Initiation of Palbociclib 10/20/2014; Faslodex 10/23/2014. -Palbociclib placed on hold beginning 11/07/2014 due to neutropenia. -Palbociclib resumed 11/27/2014 on an every other day schedule -Palbociclib resumed 01/04/2015 at a reduced dose of 100 mg daily for 21 days. -Palbociclib beginning 02/12/2015 at a dose of 100 mg daily for 14 days on/14 days off. 3. BRCA1 variant felt to be a polymorphism  4. Pain/tenderness at the left low anterolateral chest wall-she completed palliative radiation on 05/28/2012 with improvement in the pain. She was also treated with radiation to the thoracic spine.  5. Anemia/leukopenia-likely secondary to breast cancer involving the bone marrow and radiation /afinitor -the anemia is improved  6. History of mildly elevated liver enzymes-? Related to afinitor,? Secondary to metastatic breast cancer 7. Neutropenia secondary to Palbociclib. Improved.     Disposition:  Ms. Blanchette appears to be tolerating the [facility] well. She will begin another two-week cycle on 04/12/2015. She will return for a nadir CBC 04/28/2015. She continues monthly Faslodex.  The discomfort at the left shoulder and right posterior chest wall could  be related to progression of breast cancer  or a benign musculoskeletal condition. She will be scheduled for a restaging PET scan prior to an office visit 05/19/2015.  [name], MD  04/09/2015  9:56 AM

## 2015-04-09 NOTE — Telephone Encounter (Signed)
Add to previous note................ Central radiology scheduling will contact patient re pet scan - patient aware.

## 2015-04-09 NOTE — Telephone Encounter (Signed)
Gave patient avs report and appointments for August.  °

## 2015-04-14 ENCOUNTER — Telehealth: Payer: Self-pay | Admitting: *Deleted

## 2015-04-14 NOTE — Telephone Encounter (Signed)
SPOKE TO LINDA DAVIS IN MANAGED CARE. SHE WILL TAKE CARE OF THE PRIOR AUTHORIZATION FOR PT.'S PET SCAN AND NOTIFY PT. BEFORE WEEK'S END. THE ABOVE INFORMATION WAS CALLED TO PT.

## 2015-04-16 ENCOUNTER — Telehealth: Payer: Self-pay

## 2015-04-16 NOTE — Telephone Encounter (Signed)
ibrance refill request to Dr Gearldine Shown nurse. Prior Rx filled 7/21, pt on 14 days on 14 days off cycle

## 2015-04-28 ENCOUNTER — Other Ambulatory Visit (HOSPITAL_BASED_OUTPATIENT_CLINIC_OR_DEPARTMENT_OTHER): Payer: 59

## 2015-04-28 ENCOUNTER — Telehealth: Payer: Self-pay | Admitting: *Deleted

## 2015-04-28 DIAGNOSIS — C50911 Malignant neoplasm of unspecified site of right female breast: Secondary | ICD-10-CM | POA: Diagnosis not present

## 2015-04-28 DIAGNOSIS — C50919 Malignant neoplasm of unspecified site of unspecified female breast: Secondary | ICD-10-CM

## 2015-04-28 DIAGNOSIS — C7951 Secondary malignant neoplasm of bone: Principal | ICD-10-CM

## 2015-04-28 LAB — COMPREHENSIVE METABOLIC PANEL (CC13)
ALBUMIN: 4.2 g/dL (ref 3.5–5.0)
ALT: 25 U/L (ref 0–55)
AST: 27 U/L (ref 5–34)
Alkaline Phosphatase: 54 U/L (ref 40–150)
Anion Gap: 6 mEq/L (ref 3–11)
BUN: 15.2 mg/dL (ref 7.0–26.0)
CHLORIDE: 108 meq/L (ref 98–109)
CO2: 28 mEq/L (ref 22–29)
Calcium: 9.8 mg/dL (ref 8.4–10.4)
Creatinine: 0.8 mg/dL (ref 0.6–1.1)
EGFR: 77 mL/min/{1.73_m2} — ABNORMAL LOW (ref >90)
Glucose: 90 mg/dl (ref 70–140)
Potassium: 4.6 mEq/L (ref 3.5–5.1)
SODIUM: 142 meq/L (ref 136–145)
Total Bilirubin: 0.64 mg/dL (ref 0.20–1.20)
Total Protein: 7.4 g/dL (ref 6.4–8.3)

## 2015-04-28 LAB — CBC WITH DIFFERENTIAL/PLATELET
BASO%: 0 % (ref 0.0–2.0)
BASOS ABS: 0 10*3/uL (ref 0.0–0.1)
EOS ABS: 0 10*3/uL (ref 0.0–0.5)
EOS%: 1.4 % (ref 0.0–7.0)
HCT: 35.4 % (ref 34.8–46.6)
HEMOGLOBIN: 12.5 g/dL (ref 11.6–15.9)
LYMPH%: 43.2 % (ref 14.0–49.7)
MCH: 34 pg (ref 25.1–34.0)
MCHC: 35.3 g/dL (ref 31.5–36.0)
MCV: 96.2 fL (ref 79.5–101.0)
MONO#: 0.1 10*3/uL (ref 0.1–0.9)
MONO%: 6.5 % (ref 0.0–14.0)
NEUT#: 0.7 10*3/uL — ABNORMAL LOW (ref 1.5–6.5)
NEUT%: 48.9 % (ref 38.4–76.8)
PLATELETS: 191 10*3/uL (ref 145–400)
RBC: 3.68 10*6/uL — AB (ref 3.70–5.45)
RDW: 14.7 % — ABNORMAL HIGH (ref 11.2–14.5)
WBC: 1.4 10*3/uL — AB (ref 3.9–10.3)
lymph#: 0.6 10*3/uL — ABNORMAL LOW (ref 0.9–3.3)

## 2015-04-28 NOTE — Telephone Encounter (Signed)
Per Dr. Benay Spice; notified pt that white count is low, call for any fever, chills, etc and to hold Ibrance until next visit on 8/30.  Pt verbalized understanding and confirmed appt for lab/inj 05/07/15

## 2015-04-28 NOTE — Telephone Encounter (Signed)
-----   Message from Ladell Pier, MD sent at 04/28/2015  1:41 PM EDT ----- Please call patient, white count is low, call for fever, Hold palbociclib until next visit

## 2015-05-06 ENCOUNTER — Other Ambulatory Visit: Payer: Self-pay | Admitting: *Deleted

## 2015-05-06 DIAGNOSIS — C7951 Secondary malignant neoplasm of bone: Secondary | ICD-10-CM

## 2015-05-06 DIAGNOSIS — C50919 Malignant neoplasm of unspecified site of unspecified female breast: Secondary | ICD-10-CM

## 2015-05-06 MED ORDER — HYDROCODONE-ACETAMINOPHEN 7.5-325 MG PO TABS: 1.0000 | ORAL_TABLET | Freq: Four times a day (QID) | ORAL | Status: DC | PRN

## 2015-05-06 NOTE — Telephone Encounter (Signed)
Pt requesting prescription for Norco refill, understands she won't be able to fill until 05/17/15, but she just wants to have a script to drop off so she would not have to make an extra trip; will obtain script tomorrow when receiving injection.

## 2015-05-07 ENCOUNTER — Other Ambulatory Visit (HOSPITAL_BASED_OUTPATIENT_CLINIC_OR_DEPARTMENT_OTHER): Payer: 59

## 2015-05-07 ENCOUNTER — Ambulatory Visit (HOSPITAL_BASED_OUTPATIENT_CLINIC_OR_DEPARTMENT_OTHER): Payer: 59

## 2015-05-07 ENCOUNTER — Other Ambulatory Visit: Payer: Self-pay | Admitting: Oncology

## 2015-05-07 VITALS — BP 121/62 | HR 60 | Temp 97.3°F

## 2015-05-07 DIAGNOSIS — Z5111 Encounter for antineoplastic chemotherapy: Secondary | ICD-10-CM | POA: Diagnosis not present

## 2015-05-07 DIAGNOSIS — C50919 Malignant neoplasm of unspecified site of unspecified female breast: Secondary | ICD-10-CM

## 2015-05-07 DIAGNOSIS — C50911 Malignant neoplasm of unspecified site of right female breast: Secondary | ICD-10-CM

## 2015-05-07 DIAGNOSIS — C7951 Secondary malignant neoplasm of bone: Secondary | ICD-10-CM | POA: Diagnosis not present

## 2015-05-07 DIAGNOSIS — Z9889 Other specified postprocedural states: Secondary | ICD-10-CM

## 2015-05-07 LAB — CBC WITH DIFFERENTIAL/PLATELET
BASO%: 0.8 % (ref 0.0–2.0)
Basophils Absolute: 0 10*3/uL (ref 0.0–0.1)
EOS ABS: 0 10*3/uL (ref 0.0–0.5)
EOS%: 0.9 % (ref 0.0–7.0)
HCT: 38.9 % (ref 34.8–46.6)
HGB: 13.4 g/dL (ref 11.6–15.9)
LYMPH%: 32.6 % (ref 14.0–49.7)
MCH: 33.7 pg (ref 25.1–34.0)
MCHC: 34.4 g/dL (ref 31.5–36.0)
MCV: 97.9 fL (ref 79.5–101.0)
MONO#: 0.3 10*3/uL (ref 0.1–0.9)
MONO%: 12.4 % (ref 0.0–14.0)
NEUT#: 1.4 10*3/uL — ABNORMAL LOW (ref 1.5–6.5)
NEUT%: 53.3 % (ref 38.4–76.8)
PLATELETS: 164 10*3/uL (ref 145–400)
RBC: 3.97 10*6/uL (ref 3.70–5.45)
RDW: 15.8 % — ABNORMAL HIGH (ref 11.2–14.5)
WBC: 2.7 10*3/uL — ABNORMAL LOW (ref 3.9–10.3)
lymph#: 0.9 10*3/uL (ref 0.9–3.3)

## 2015-05-07 LAB — BASIC METABOLIC PANEL (CC13)
Anion Gap: 10 mEq/L (ref 3–11)
BUN: 16.3 mg/dL (ref 7.0–26.0)
CO2: 26 mEq/L (ref 22–29)
Calcium: 11.3 mg/dL — ABNORMAL HIGH (ref 8.4–10.4)
Chloride: 106 mEq/L (ref 98–109)
Creatinine: 0.8 mg/dL (ref 0.6–1.1)
EGFR: 76 mL/min/{1.73_m2} — ABNORMAL LOW (ref >90)
Glucose: 89 mg/dl (ref 70–140)
Potassium: 4.6 mEq/L (ref 3.5–5.1)
Sodium: 142 mEq/L (ref 136–145)

## 2015-05-07 MED ORDER — DENOSUMAB 120 MG/1.7ML ~~LOC~~ SOLN
120.0000 mg | Freq: Once | SUBCUTANEOUS | Status: AC
  Administered 2015-05-07: 120 mg via SUBCUTANEOUS
  Filled 2015-05-07: qty 1.7

## 2015-05-07 MED ORDER — FULVESTRANT 250 MG/5ML IM SOLN
500.0000 mg | INTRAMUSCULAR | Status: DC
  Administered 2015-05-07: 500 mg via INTRAMUSCULAR
  Filled 2015-05-07: qty 10

## 2015-05-08 ENCOUNTER — Telehealth: Payer: Self-pay | Admitting: Oncology

## 2015-05-08 ENCOUNTER — Telehealth: Payer: Self-pay | Admitting: *Deleted

## 2015-05-08 DIAGNOSIS — C7951 Secondary malignant neoplasm of bone: Secondary | ICD-10-CM

## 2015-05-08 DIAGNOSIS — C50919 Malignant neoplasm of unspecified site of unspecified female breast: Secondary | ICD-10-CM

## 2015-05-08 DIAGNOSIS — C7952 Secondary malignant neoplasm of bone marrow: Secondary | ICD-10-CM

## 2015-05-08 MED ORDER — PALBOCICLIB 100 MG PO CAPS: 100.0000 mg | ORAL_CAPSULE | Freq: Every day | ORAL | Status: DC

## 2015-05-08 NOTE — Telephone Encounter (Signed)
-----   Message from Ladell Pier, MD sent at 05/07/2015  9:20 PM EDT ----- Please call patient, did she take calcium prior to blood draw 8/18? Cbc,cmet next visit

## 2015-05-08 NOTE — Telephone Encounter (Signed)
Pt reports not having taken her calcium before lab draw: instructed pt to call us with changes in mentation status. POF in for lab appt on 8/30 for CBC/CMET. Pt also requesting Ibrance prescription. Per Dr. Benay Spice, okay to restart Ibrance on Sunday as well. Pt voices understanding of instruction.

## 2015-05-08 NOTE — Telephone Encounter (Signed)
S/w pt confirming MD visit moved due to PET scan... Mailed out schedule.Marland Kitchen KJ

## 2015-05-18 ENCOUNTER — Ambulatory Visit (HOSPITAL_COMMUNITY)
Admission: RE | Admit: 2015-05-18 | Discharge: 2015-05-18 | Disposition: A | Discharge status: Home / Self Care | Payer: 59 | Source: Ambulatory Visit | Attending: Oncology | Admitting: Oncology

## 2015-05-18 ENCOUNTER — Encounter (HOSPITAL_COMMUNITY): Payer: Self-pay

## 2015-05-18 ENCOUNTER — Other Ambulatory Visit (HOSPITAL_BASED_OUTPATIENT_CLINIC_OR_DEPARTMENT_OTHER): Payer: 59

## 2015-05-18 DIAGNOSIS — C50919 Malignant neoplasm of unspecified site of unspecified female breast: Secondary | ICD-10-CM | POA: Insufficient documentation

## 2015-05-18 DIAGNOSIS — C7951 Secondary malignant neoplasm of bone: Secondary | ICD-10-CM

## 2015-05-18 DIAGNOSIS — C50911 Malignant neoplasm of unspecified site of right female breast: Secondary | ICD-10-CM | POA: Diagnosis not present

## 2015-05-18 LAB — CBC WITH DIFFERENTIAL/PLATELET
BASO%: 0.4 % (ref 0.0–2.0)
Basophils Absolute: 0 10*3/uL (ref 0.0–0.1)
EOS%: 0.4 % (ref 0.0–7.0)
Eosinophils Absolute: 0 10*3/uL (ref 0.0–0.5)
HEMATOCRIT: 36.8 % (ref 34.8–46.6)
HEMOGLOBIN: 12.9 g/dL (ref 11.6–15.9)
LYMPH#: 0.7 10*3/uL — AB (ref 0.9–3.3)
LYMPH%: 28.6 % (ref 14.0–49.7)
MCH: 33.9 pg (ref 25.1–34.0)
MCHC: 35.1 g/dL (ref 31.5–36.0)
MCV: 96.6 fL (ref 79.5–101.0)
MONO#: 0.1 10*3/uL (ref 0.1–0.9)
MONO%: 4.2 % (ref 0.0–14.0)
NEUT#: 1.6 10*3/uL (ref 1.5–6.5)
NEUT%: 66.4 % (ref 38.4–76.8)
Platelets: 282 10*3/uL (ref 145–400)
RBC: 3.81 10*6/uL (ref 3.70–5.45)
RDW: 14.5 % (ref 11.2–14.5)
WBC: 2.4 10*3/uL — ABNORMAL LOW (ref 3.9–10.3)

## 2015-05-18 LAB — COMPREHENSIVE METABOLIC PANEL (CC13)
ALBUMIN: 4.1 g/dL (ref 3.5–5.0)
ALT: 23 U/L (ref 0–55)
AST: 28 U/L (ref 5–34)
Alkaline Phosphatase: 62 U/L (ref 40–150)
Anion Gap: 6 mEq/L (ref 3–11)
BUN: 13.7 mg/dL (ref 7.0–26.0)
CALCIUM: 10.2 mg/dL (ref 8.4–10.4)
CHLORIDE: 109 meq/L (ref 98–109)
CO2: 27 mEq/L (ref 22–29)
CREATININE: 0.8 mg/dL (ref 0.6–1.1)
EGFR: 75 mL/min/{1.73_m2} — ABNORMAL LOW (ref >90)
GLUCOSE: 93 mg/dL (ref 70–140)
Potassium: 5 mEq/L (ref 3.5–5.1)
SODIUM: 142 meq/L (ref 136–145)
Total Bilirubin: 0.46 mg/dL (ref 0.20–1.20)
Total Protein: 7.4 g/dL (ref 6.4–8.3)

## 2015-05-18 LAB — GLUCOSE, CAPILLARY: Glucose-Capillary: 86 mg/dL (ref 65–99)

## 2015-05-18 MED ORDER — FLUDEOXYGLUCOSE F - 18 (FDG) INJECTION
9.2000 | Freq: Once | INTRAVENOUS | Status: DC | PRN
  Administered 2015-05-18: 9.2 via INTRAVENOUS
  Filled 2015-05-18: qty 9.2

## 2015-05-19 ENCOUNTER — Telehealth: Payer: Self-pay | Admitting: Oncology

## 2015-05-19 ENCOUNTER — Ambulatory Visit (HOSPITAL_BASED_OUTPATIENT_CLINIC_OR_DEPARTMENT_OTHER): Payer: 59 | Admitting: Oncology

## 2015-05-19 VITALS — BP 123/70 | HR 66 | Temp 97.5°F | Resp 18 | Ht 65.0 in | Wt 165.1 lb

## 2015-05-19 DIAGNOSIS — C50911 Malignant neoplasm of unspecified site of right female breast: Secondary | ICD-10-CM | POA: Diagnosis not present

## 2015-05-19 DIAGNOSIS — D702 Other drug-induced agranulocytosis: Secondary | ICD-10-CM

## 2015-05-19 DIAGNOSIS — D630 Anemia in neoplastic disease: Secondary | ICD-10-CM

## 2015-05-19 DIAGNOSIS — C50919 Malignant neoplasm of unspecified site of unspecified female breast: Secondary | ICD-10-CM

## 2015-05-19 DIAGNOSIS — C7951 Secondary malignant neoplasm of bone: Secondary | ICD-10-CM | POA: Diagnosis not present

## 2015-05-19 NOTE — Telephone Encounter (Signed)
per pof to sch pt appt-sent Tiffany in HIM email to forward med rec to Dr Karena Addison @ [facility] for sch-adv pt they will call to sch appt-gave pt copy of avs-pt understood

## 2015-05-19 NOTE — Progress Notes (Signed)
[place] OFFICE PROGRESS NOTE   Diagnosis: Breast cancer  INTERVAL HISTORY:   She returns as scheduled. She is completing another cycle of Ibrance. She received xgeva and faslodex on 05/07/2015. Stacy Horn reports improvement in the bone pain recently. She now has pain only at the left shoulder. She takes 1 hydrocodone tablet in the evening. She has returned to exercising.  Objective:  Vital signs in last 24 hours:  Blood pressure 123/70, pulse 66, temperature 97.5 F (36.4 C), temperature source Oral, resp. rate 18, height 5' 5" (1.651 m), weight 165 lb 1.6 oz (74.889 kg), last menstrual period 10/08/2014, SpO2 100 %.    HEENT: Neck without mass Lymphatics: Less than 1 cm mobile left axillary node Resp: Lungs clear bilaterally Cardio: Regular rate and rhythm GI: No hepatomegaly, nontender Vascular: No leg edema Breasts: Status post bilateral mastectomies with implants in place. No evidence for chest wall tumor recurrence.     Lab Results:  Lab Results  Component Value Date   WBC 2.4* 05/18/2015   HGB 12.9 05/18/2015   HCT 36.8 05/18/2015   MCV 96.6 05/18/2015   PLT 282 05/18/2015   NEUTROABS 1.6 05/18/2015     Imaging:  Nm Pet Image Restag (ps) Skull Base To Thigh  05/18/2015   CLINICAL DATA:  Subsequent treatment strategy for metastatic breast carcinoma.  EXAM: NUCLEAR MEDICINE PET SKULL BASE TO THIGH  TECHNIQUE: 7.9 mCi F-18 FDG was injected intravenously. Full-ring PET imaging was performed from the skull base to thigh after the radiotracer. CT data was obtained and used for attenuation correction and anatomic localization.  FASTING BLOOD GLUCOSE:  Value: 89 mg/dl  COMPARISON:  10/08/2014  FINDINGS: NECK  No hypermetabolic lymph nodes in the neck.  CHEST  No hypermetabolic mediastinal or hilar nodes. No suspicious pulmonary nodules on the CT scan.  ABDOMEN/PELVIS  No abnormal hypermetabolic activity within the liver, pancreas, adrenal glands, or  spleen. No hypermetabolic lymph nodes in the abdomen or pelvis.  Left renal cystic lesion and small right ovarian cyst are stable and show no metabolic activity.  SKELETON  Diffuse hypermetabolic bone metastases are again seen which have similar appearance on CT, but show increased hypermetabolic activity since prior study. Index lesion in C7 vertebral body has an SUV max of 5.9 compared to 3.0 previously. Another index lesion in the left pubis has an SUV max of 14.9 compared to 4.6 previously.  IMPRESSION: Diffuse bone metastases show increased hypermetabolic activity since prior study, consistent with progression of disease.  No evidence of soft tissue metastatic disease.   Electronically Signed   By: Earle Gell M.D.   On: 05/18/2015 15:03    Medications: I have reviewed the patient's current medications.  Assessment/Plan: 1. Multicentric invasive lobular carcinoma of the right breast diagnosed in December of 1999, a right mastectomy followed by adjuvant [facility] chemotherapy, 5 years of tamoxifen, and 5 years of Femara. The Femara was completed in June of 2010. The right breast mass excision in January 2000 was ER positive, PR positive, and HER-2 negative  2. Pain at the left groin area with a CT of the pelvis on 02/07/12 confirming a destructive lytic lesion at the left pubic symphysis, status post a CT-guided biopsy on 02/16/12 confirming metastatic carcinoma, focally ER positive .  -PET scan 03/07/2012 confirmed multiple hypermetabolic bone metastases with no other evidence of metastatic disease  -Status post palliative radiation to the pelvis beginning on 03/12/2012  -Initiation of Arimidex on 03/27/2012 -initiation of every  three-month xgeva on 04/04/2012  -PET scan September 15 [id number] new hypermetabolic bone lesions  -Initiation of Faslodex 09/27/2012  -Restaging PET scan 04/08/2013 with evidence of disease progression in the bones  -Initiation of Aromasin/afinitor 05/29/2013  -Iliac  biopsy at [facility] 05/22/2013 confirming HER-2 negative metastatic breast  -PET scan 11/18/2013 with a decrease in hypermetabolic activity  -PET scan 10/08/2014 with progressive diffuse hypermetabolic bone metastases -Initiation of Palbociclib 10/20/2014; Faslodex 10/23/2014. -Palbociclib placed on hold beginning 11/07/2014 due to neutropenia. -Palbociclib resumed 11/27/2014 on an every other day schedule -Palbociclib resumed 01/04/2015 at a reduced dose of 100 mg daily for 21 days. -Palbociclib beginning 02/12/2015 at a dose of 100 mg daily for 14 days on/14 days off. -PET scan 05/18/2015 with diffuse hypermetabolic bone lesions, increased metabolic activity compared to the PET scan from January 2016 3. BRCA1 variant felt to be a polymorphism  4. Pain/tenderness at the left low anterolateral chest wall-she completed palliative radiation on 05/28/2012 with improvement in the pain. She was also treated with radiation to the thoracic spine.  5. Anemia/leukopenia-likely secondary to breast cancer involving the bone marrow and radiation /afinitor -the anemia is improved  6. History of mildly elevated liver enzymes-? Related to afinitor,? Secondary to metastatic breast cancer 7. Neutropenia secondary to Palbociclib. Improved.    Disposition:  Stacy Horn has been maintained on palbociclib and Faslodex since February 2016. Her overall clinical status is stable. I reviewed the PET images with Stacy Horn and her husband. The distribution of metastases appears unchanged, but the lesions do appear more metabolically active compared to the January study.  We decided to place treatment on hold. I will refer her back to Dr. Albertina Horn to consider treatment options including clinical trials at [facility].  She will return for an office visit 06/12/2015.  Stacy Coder, MD  05/19/2015  10:31 AM

## 2015-05-26 ENCOUNTER — Encounter: Payer: Self-pay | Admitting: Oncology

## 2015-05-26 NOTE — Progress Notes (Signed)
I placed refill form on desk of nurse for dr. Benay Spice- Ibrance-request from [place]

## 2015-05-27 ENCOUNTER — Telehealth: Payer: Self-pay | Admitting: Oncology

## 2015-05-27 NOTE — Telephone Encounter (Signed)
PT APPT. WITH DR Albertina Parr IS 06/05/15@9 :30. MEDICAL RECORDS FAXED. PT IS AWARE.

## 2015-06-05 DIAGNOSIS — C7951 Secondary malignant neoplasm of bone: Secondary | ICD-10-CM | POA: Diagnosis not present

## 2015-06-07 ENCOUNTER — Encounter: Payer: Self-pay | Admitting: Oncology

## 2015-06-08 ENCOUNTER — Other Ambulatory Visit: Payer: Self-pay | Admitting: *Deleted

## 2015-06-12 ENCOUNTER — Ambulatory Visit (HOSPITAL_BASED_OUTPATIENT_CLINIC_OR_DEPARTMENT_OTHER): Payer: Medicare Other

## 2015-06-12 ENCOUNTER — Telehealth: Payer: Self-pay | Admitting: Oncology

## 2015-06-12 ENCOUNTER — Telehealth: Payer: Self-pay | Admitting: *Deleted

## 2015-06-12 ENCOUNTER — Other Ambulatory Visit: Payer: Self-pay | Admitting: *Deleted

## 2015-06-12 ENCOUNTER — Ambulatory Visit (HOSPITAL_COMMUNITY)
Admission: RE | Admit: 2015-06-12 | Discharge: 2015-06-12 | Disposition: A | Discharge status: Home / Self Care | Payer: Medicare Other | Source: Ambulatory Visit | Attending: Oncology | Admitting: Oncology

## 2015-06-12 ENCOUNTER — Ambulatory Visit (HOSPITAL_BASED_OUTPATIENT_CLINIC_OR_DEPARTMENT_OTHER): Payer: Medicare Other | Admitting: Oncology

## 2015-06-12 VITALS — BP 124/59 | HR 63 | Temp 98.2°F | Resp 16 | Ht 65.0 in | Wt 164.6 lb

## 2015-06-12 DIAGNOSIS — Z5111 Encounter for antineoplastic chemotherapy: Secondary | ICD-10-CM | POA: Diagnosis present

## 2015-06-12 DIAGNOSIS — C7951 Secondary malignant neoplasm of bone: Secondary | ICD-10-CM

## 2015-06-12 DIAGNOSIS — C50919 Malignant neoplasm of unspecified site of unspecified female breast: Secondary | ICD-10-CM

## 2015-06-12 DIAGNOSIS — C7952 Secondary malignant neoplasm of bone marrow: Secondary | ICD-10-CM

## 2015-06-12 DIAGNOSIS — Z9889 Other specified postprocedural states: Secondary | ICD-10-CM

## 2015-06-12 DIAGNOSIS — M25512 Pain in left shoulder: Secondary | ICD-10-CM | POA: Insufficient documentation

## 2015-06-12 DIAGNOSIS — C50911 Malignant neoplasm of unspecified site of right female breast: Secondary | ICD-10-CM

## 2015-06-12 MED ORDER — EPINEPHRINE HCL 0.1 MG/ML IJ SOLN: 0.2500 mg | Freq: Once | INTRAMUSCULAR | Status: DC | PRN

## 2015-06-12 MED ORDER — DENOSUMAB 120 MG/1.7ML ~~LOC~~ SOLN
120.0000 mg | Freq: Once | SUBCUTANEOUS | Status: DC
Start: 2015-06-12 — End: 2015-06-12

## 2015-06-12 MED ORDER — DIPHENHYDRAMINE HCL 50 MG/ML IJ SOLN: 25.0000 mg | Freq: Once | INTRAMUSCULAR | Status: DC | PRN

## 2015-06-12 MED ORDER — ALBUTEROL SULFATE (2.5 MG/3ML) 0.083% IN NEBU
2.5000 mg | INHALATION_SOLUTION | Freq: Once | RESPIRATORY_TRACT | Status: DC | PRN
  Filled 2015-06-12: qty 3

## 2015-06-12 MED ORDER — PALBOCICLIB 100 MG PO CAPS: 100.0000 mg | ORAL_CAPSULE | Freq: Every day | ORAL | Status: DC

## 2015-06-12 MED ORDER — FULVESTRANT 250 MG/5ML IM SOLN
500.0000 mg | INTRAMUSCULAR | Status: DC
  Administered 2015-06-12: 500 mg via INTRAMUSCULAR
  Filled 2015-06-12: qty 10

## 2015-06-12 MED ORDER — METHYLPREDNISOLONE SODIUM SUCC 125 MG IJ SOLR: 125.0000 mg | Freq: Once | INTRAMUSCULAR | Status: DC | PRN

## 2015-06-12 MED ORDER — DIPHENHYDRAMINE HCL 50 MG/ML IJ SOLN: 50.0000 mg | Freq: Once | INTRAMUSCULAR | Status: DC | PRN

## 2015-06-12 MED ORDER — SODIUM CHLORIDE 0.9 % IV SOLN: Freq: Once | INTRAVENOUS | Status: DC | PRN

## 2015-06-12 MED ORDER — HYDROCODONE-ACETAMINOPHEN 7.5-325 MG PO TABS: 1.0000 | ORAL_TABLET | Freq: Four times a day (QID) | ORAL | Status: DC | PRN

## 2015-06-12 NOTE — Progress Notes (Signed)
[place] OFFICE PROGRESS NOTE   Diagnosis: Breast cancer  INTERVAL HISTORY:   Stacy Horn returns as scheduled. She saw Dr. Albertina Parr last week and reports Dr. Albertina Parr recommends continuing Ibrance and Faslodex. Stacy Horn continues to have pain at the left shoulder that is worse with certain movements. She takes 1-2 hydrocodone tablets per day for relief of pain. No new complaint. She resumed Ibrance on 06/07/2015.  Objective:  Vital signs in last 24 hours:  Blood pressure 124/59, pulse 63, temperature 98.2 F (36.8 C), temperature source Oral, resp. rate 16, height 5' 5" (1.651 m), weight 164 lb 9.6 oz (74.662 kg), last menstrual period 10/08/2014, SpO2 99 %.   Resp: Lungs clear bilaterally Cardio: Regular rate and rhythm GI: No hepatomegaly Vascular: No leg edema Musculoskeletal: Pain with anterior and posterior extension at the left shoulder    Lab Results:  Lab Results  Component Value Date   WBC 2.4* 05/18/2015   HGB 12.9 05/18/2015   HCT 36.8 05/18/2015   MCV 96.6 05/18/2015   PLT 282 05/18/2015   NEUTROABS 1.6 05/18/2015     Lab Results  Component Value Date   CEA <0.5 03/06/2012     Medications: I have reviewed the patient's current medications.  Assessment/Plan: 1. Multicentric invasive lobular carcinoma of the right breast diagnosed in December of 1999, a right mastectomy followed by adjuvant [facility] chemotherapy, 5 years of tamoxifen, and 5 years of Femara. The Femara was completed in June of 2010. The right breast mass excision in January 2000 was ER positive, PR positive, and HER-2 negative  2. Pain at the left groin area with a CT of the pelvis on 02/07/12 confirming a destructive lytic lesion at the left pubic symphysis, status post a CT-guided biopsy on 02/16/12 confirming metastatic carcinoma, focally ER positive .  -PET scan 03/07/2012 confirmed multiple hypermetabolic bone metastases with no other evidence of metastatic disease  -Status  post palliative radiation to the pelvis beginning on 03/12/2012  -Initiation of Arimidex on 03/27/2012 -initiation of every three-month xgeva on 04/04/2012  -PET scan September 15 [id number] new hypermetabolic bone lesions  -Initiation of Faslodex 09/27/2012  -Restaging PET scan 04/08/2013 with evidence of disease progression in the bones  -Initiation of Aromasin/afinitor 05/29/2013  -Iliac biopsy at [facility] 05/22/2013 confirming HER-2 negative metastatic breast  -PET scan 11/18/2013 with a decrease in hypermetabolic activity  -PET scan 10/08/2014 with progressive diffuse hypermetabolic bone metastases -Initiation of Palbociclib 10/20/2014; Faslodex 10/23/2014. -Palbociclib placed on hold beginning 11/07/2014 due to neutropenia. -Palbociclib resumed 11/27/2014 on an every other day schedule -Palbociclib resumed 01/04/2015 at a reduced dose of 100 mg daily for 21 days. -Palbociclib beginning 02/12/2015 at a dose of 100 mg daily for 14 days on/14 days off. -PET scan 05/18/2015 with diffuse hypermetabolic bone lesions, increased metabolic activity compared to the PET scan from January 2016 -Palbociclib resume 06/07/2015, Faslodex continued 06/12/2015 3. BRCA1 variant felt to be a polymorphism  4. Pain/tenderness at the left low anterolateral chest wall-she completed palliative radiation on 05/28/2012 with improvement in the pain. She was also treated with radiation to the thoracic spine.  5. Anemia/leukopenia-likely secondary to breast cancer involving the bone marrow and radiation /afinitor -the anemia is improved  6. History of mildly elevated liver enzymes-? Related to afinitor,? Secondary to metastatic breast cancer 7. Neutropenia secondary to Palbociclib. Improved.    Disposition:  Stacy Horn appears stable. She saw Dr. Albertina Parr last week and she recommends continuing the current treatment with a restaging PET  scan at a 4 month interval.  She resumed Faslodex therapy today. She  will complete the current cycle of palbociclib and return for an office visit 07/09/2015. She will be scheduled for labs prior to the next scheduled cycle of palbociclib.  The pain is left shoulder is most likely related to the second breast cancer. We reviewed the PET images and there is hypermetabolism at the proximal left humerus and shoulder joint. We will obtain a plain x-ray of the left shoulder today.  [name], MD  06/12/2015  12:53 PM

## 2015-06-12 NOTE — Telephone Encounter (Signed)
Per Dr. Benay Spice; notified pt there is a sclerotic metastasis in the shoulder blade near the shoulder joint, no fracture.  I doubt this explains the pain with motion.  We can get a CT or MRI of shoulder if the pain worsens.  Pt verbalized understanding and states she will call if pain worsens.

## 2015-06-12 NOTE — Telephone Encounter (Signed)
-----   Message from Stacy Pier, MD sent at 06/12/2015  4:15 PM EDT ----- Please call patient, there is a sclerotic metastasis in the shoulder blade near the shoulder joint, no fracture I doubt this explains the pain with motion We can get a CT or MRI of shoulder if the pain worsens

## 2015-06-12 NOTE — Telephone Encounter (Signed)
Gave and printed appt sched and avs fo rpt for OCT °

## 2015-06-29 ENCOUNTER — Encounter: Payer: Self-pay | Admitting: Oncology

## 2015-06-29 NOTE — Progress Notes (Signed)
I placed form from [place] on desk of nurse for dr Benay Spice

## 2015-07-01 ENCOUNTER — Other Ambulatory Visit (HOSPITAL_BASED_OUTPATIENT_CLINIC_OR_DEPARTMENT_OTHER): Payer: Medicare Other

## 2015-07-01 ENCOUNTER — Telehealth: Payer: Self-pay | Admitting: *Deleted

## 2015-07-01 DIAGNOSIS — C50919 Malignant neoplasm of unspecified site of unspecified female breast: Secondary | ICD-10-CM

## 2015-07-01 DIAGNOSIS — C50911 Malignant neoplasm of unspecified site of right female breast: Secondary | ICD-10-CM

## 2015-07-01 DIAGNOSIS — C7951 Secondary malignant neoplasm of bone: Principal | ICD-10-CM

## 2015-07-01 LAB — COMPREHENSIVE METABOLIC PANEL (CC13)
ALBUMIN: 4.2 g/dL (ref 3.5–5.0)
ALT: 43 U/L (ref 0–55)
AST: 55 U/L — ABNORMAL HIGH (ref 5–34)
Alkaline Phosphatase: 71 U/L (ref 40–150)
Anion Gap: 8 mEq/L (ref 3–11)
BILIRUBIN TOTAL: 0.56 mg/dL (ref 0.20–1.20)
BUN: 19.4 mg/dL (ref 7.0–26.0)
CO2: 26 meq/L (ref 22–29)
CREATININE: 0.8 mg/dL (ref 0.6–1.1)
Calcium: 10.5 mg/dL — ABNORMAL HIGH (ref 8.4–10.4)
Chloride: 107 mEq/L (ref 98–109)
EGFR: 81 mL/min/{1.73_m2} — ABNORMAL LOW (ref >90)
GLUCOSE: 94 mg/dL (ref 70–140)
Potassium: 4.9 mEq/L (ref 3.5–5.1)
SODIUM: 141 meq/L (ref 136–145)
TOTAL PROTEIN: 7.7 g/dL (ref 6.4–8.3)

## 2015-07-01 LAB — CBC WITH DIFFERENTIAL/PLATELET
BASO%: 1.1 % (ref 0.0–2.0)
Basophils Absolute: 0 10*3/uL (ref 0.0–0.1)
EOS%: 0.7 % (ref 0.0–7.0)
Eosinophils Absolute: 0 10*3/uL (ref 0.0–0.5)
HEMATOCRIT: 38.4 % (ref 34.8–46.6)
HGB: 13.1 g/dL (ref 11.6–15.9)
LYMPH#: 0.7 10*3/uL — AB (ref 0.9–3.3)
LYMPH%: 32.4 % (ref 14.0–49.7)
MCH: 33.6 pg (ref 25.1–34.0)
MCHC: 34.2 g/dL (ref 31.5–36.0)
MCV: 98.2 fL (ref 79.5–101.0)
MONO#: 0.3 10*3/uL (ref 0.1–0.9)
MONO%: 13.1 % (ref 0.0–14.0)
NEUT#: 1.2 10*3/uL — ABNORMAL LOW (ref 1.5–6.5)
NEUT%: 52.7 % (ref 38.4–76.8)
Platelets: 150 10*3/uL (ref 145–400)
RBC: 3.91 10*6/uL (ref 3.70–5.45)
RDW: 14.7 % — AB (ref 11.2–14.5)
WBC: 2.3 10*3/uL — ABNORMAL LOW (ref 3.9–10.3)

## 2015-07-01 NOTE — Telephone Encounter (Signed)
-----   Message from Ladell Pier, MD sent at 07/01/2015  1:23 PM EDT ----- Please call patient, wbcs mildly low, ok to proceed with palbociclib on schedule, f/u as scheduled, labs at next visit

## 2015-07-01 NOTE — Telephone Encounter (Signed)
Per Dr. Benay Spice; notified pt that wbc mildly low but okay to proceed with Ibrance on schedule. Pt verbalized understanding and confirmed appt for 07/09/15

## 2015-07-09 ENCOUNTER — Telehealth: Payer: Self-pay | Admitting: Oncology

## 2015-07-09 ENCOUNTER — Ambulatory Visit (HOSPITAL_COMMUNITY)
Admission: RE | Admit: 2015-07-09 | Discharge: 2015-07-09 | Disposition: A | Discharge status: Home / Self Care | Payer: Medicare Other | Source: Ambulatory Visit | Attending: Oncology | Admitting: Oncology

## 2015-07-09 ENCOUNTER — Telehealth: Payer: Self-pay | Admitting: *Deleted

## 2015-07-09 ENCOUNTER — Ambulatory Visit (HOSPITAL_BASED_OUTPATIENT_CLINIC_OR_DEPARTMENT_OTHER): Payer: 59 | Admitting: Oncology

## 2015-07-09 ENCOUNTER — Ambulatory Visit (HOSPITAL_BASED_OUTPATIENT_CLINIC_OR_DEPARTMENT_OTHER): Payer: Medicare Other

## 2015-07-09 ENCOUNTER — Other Ambulatory Visit (HOSPITAL_BASED_OUTPATIENT_CLINIC_OR_DEPARTMENT_OTHER): Payer: Medicare Other

## 2015-07-09 VITALS — BP 114/57 | HR 64 | Temp 98.6°F | Resp 18 | Ht 65.0 in | Wt 166.2 lb

## 2015-07-09 DIAGNOSIS — Z5111 Encounter for antineoplastic chemotherapy: Secondary | ICD-10-CM | POA: Diagnosis present

## 2015-07-09 DIAGNOSIS — M5134 Other intervertebral disc degeneration, thoracic region: Secondary | ICD-10-CM | POA: Diagnosis not present

## 2015-07-09 DIAGNOSIS — M50322 Other cervical disc degeneration at C5-C6 level: Secondary | ICD-10-CM | POA: Diagnosis not present

## 2015-07-09 DIAGNOSIS — C50919 Malignant neoplasm of unspecified site of unspecified female breast: Secondary | ICD-10-CM

## 2015-07-09 DIAGNOSIS — M4184 Other forms of scoliosis, thoracic region: Secondary | ICD-10-CM | POA: Insufficient documentation

## 2015-07-09 DIAGNOSIS — C50911 Malignant neoplasm of unspecified site of right female breast: Secondary | ICD-10-CM

## 2015-07-09 DIAGNOSIS — C7951 Secondary malignant neoplasm of bone: Secondary | ICD-10-CM

## 2015-07-09 DIAGNOSIS — M546 Pain in thoracic spine: Secondary | ICD-10-CM | POA: Diagnosis not present

## 2015-07-09 DIAGNOSIS — M25512 Pain in left shoulder: Secondary | ICD-10-CM

## 2015-07-09 DIAGNOSIS — M549 Dorsalgia, unspecified: Secondary | ICD-10-CM

## 2015-07-09 DIAGNOSIS — Z9889 Other specified postprocedural states: Secondary | ICD-10-CM

## 2015-07-09 LAB — COMPREHENSIVE METABOLIC PANEL (CC13)
ALT: 28 U/L (ref 0–55)
AST: 32 U/L (ref 5–34)
Albumin: 4.1 g/dL (ref 3.5–5.0)
Alkaline Phosphatase: 72 U/L (ref 40–150)
Anion Gap: 6 mEq/L (ref 3–11)
BUN: 23.4 mg/dL (ref 7.0–26.0)
CALCIUM: 10.4 mg/dL (ref 8.4–10.4)
CHLORIDE: 107 meq/L (ref 98–109)
CO2: 28 mEq/L (ref 22–29)
CREATININE: 1 mg/dL (ref 0.6–1.1)
EGFR: 64 mL/min/{1.73_m2} — ABNORMAL LOW (ref >90)
Glucose: 93 mg/dl (ref 70–140)
Potassium: 4.4 mEq/L (ref 3.5–5.1)
Sodium: 142 mEq/L (ref 136–145)
TOTAL PROTEIN: 7.7 g/dL (ref 6.4–8.3)
Total Bilirubin: 0.48 mg/dL (ref 0.20–1.20)

## 2015-07-09 LAB — CBC WITH DIFFERENTIAL/PLATELET
BASO%: 0.6 % (ref 0.0–2.0)
Basophils Absolute: 0 10*3/uL (ref 0.0–0.1)
EOS%: 0.9 % (ref 0.0–7.0)
Eosinophils Absolute: 0 10*3/uL (ref 0.0–0.5)
HEMATOCRIT: 37.6 % (ref 34.8–46.6)
HEMOGLOBIN: 13.1 g/dL (ref 11.6–15.9)
LYMPH#: 0.8 10*3/uL — AB (ref 0.9–3.3)
LYMPH%: 28.1 % (ref 14.0–49.7)
MCH: 34.2 pg — ABNORMAL HIGH (ref 25.1–34.0)
MCHC: 34.8 g/dL (ref 31.5–36.0)
MCV: 98.2 fL (ref 79.5–101.0)
MONO#: 0.2 10*3/uL (ref 0.1–0.9)
MONO%: 6.5 % (ref 0.0–14.0)
NEUT%: 63.9 % (ref 38.4–76.8)
NEUTROS ABS: 1.9 10*3/uL (ref 1.5–6.5)
Platelets: 271 10*3/uL (ref 145–400)
RBC: 3.83 10*6/uL (ref 3.70–5.45)
RDW: 14.5 % (ref 11.2–14.5)
WBC: 3 10*3/uL — AB (ref 3.9–10.3)

## 2015-07-09 MED ORDER — HYDROMORPHONE HCL 4 MG PO TABS: 4.0000 mg | ORAL_TABLET | ORAL | Status: DC | PRN

## 2015-07-09 MED ORDER — FULVESTRANT 250 MG/5ML IM SOLN
500.0000 mg | Freq: Once | INTRAMUSCULAR | Status: AC
  Administered 2015-07-09: 500 mg via INTRAMUSCULAR
  Filled 2015-07-09: qty 10

## 2015-07-09 NOTE — Telephone Encounter (Signed)
per pof to sch pt appt-gave pt copy of avs-sent [place] email to fax copy of med records to dr Gatha Mayer [phone]

## 2015-07-09 NOTE — Telephone Encounter (Signed)
Called pt per Dr. Benay Spice: No compression fracture or cancer seen on Xray. Call for persistent back pain and we will schedule an MRI. Pt voiced understanding.

## 2015-07-09 NOTE — Progress Notes (Signed)
[facility] OFFICE PROGRESS NOTE   Diagnosis: Breast cancer  INTERVAL HISTORY:   [name] returns as scheduled. She began another cycle of Ibrance on 07/05/2015. She is scheduled for Faslodex today. She complains of increased pain in the left shoulder, especially with abduction and posterior extension. She developed mid back pain beginning 07/04/2015. She is not aware of trauma that would have caused this. The pain radiates toward the right lateral chest and is not relieved with hydrocodone. No change in chronic right leg pain. No neurologic symptoms.  Objective:  Vital signs in last 24 hours:  Blood pressure 114/57, pulse 64, temperature 98.6 F (37 C), temperature source Oral, resp. rate 18, height '5\' 5"'  (1.651 m), weight 166 lb 3.2 oz (75.388 kg), last menstrual period 10/08/2014, SpO2 99 %.   Resp: Lungs clear bilaterally Cardio: Regular rate and rhythm GI: No hepatomegaly Vascular: No leg edema Neuro: The leg strength is intact bilaterally  Musculoskeletal: Pain with abduction and posterior extension at the left shoulder. Tender with percussion over the left shoulder  joint and upper scapula, tender at the low thoracic spine.     Lab Results:  Lab Results  Component Value Date   WBC 3.0* 07/09/2015   HGB 13.1 07/09/2015   HCT 37.6 07/09/2015   MCV 98.2 07/09/2015   PLT 271 07/09/2015   NEUTROABS 1.9 07/09/2015     Medications: I have reviewed the patient's current medications.  Assessment/Plan: 1. Multicentric invasive lobular carcinoma of the right breast diagnosed in December of 1999, a right mastectomy followed by adjuvant [facility] chemotherapy, 5 years of tamoxifen, and 5 years of Femara. The Femara was completed in June of 2010. The right breast mass excision in January 2000 was ER positive, PR positive, and HER-2 negative  2. Pain at the left groin area with a CT of the pelvis on 02/07/12 confirming a destructive lytic lesion at the left pubic  symphysis, status post a CT-guided biopsy on 02/16/12 confirming metastatic carcinoma, focally ER positive .  -PET scan 03/07/2012 confirmed multiple hypermetabolic bone metastases with no other evidence of metastatic disease  -Status post palliative radiation to the pelvis beginning on 03/12/2012  -Initiation of Arimidex on 03/27/2012 -initiation of every three-month xgeva on 04/04/2012  -PET scan September 14 [id number] new hypermetabolic bone lesions  -Initiation of Faslodex 09/27/2012  -Restaging PET scan 04/08/2013 with evidence of disease progression in the bones  -Initiation of Aromasin/afinitor 05/29/2013  -Iliac biopsy at [facility] 05/22/2013 confirming HER-2 negative metastatic breast  -PET scan 11/18/2013 with a decrease in hypermetabolic activity  -PET scan 10/08/2014 with progressive diffuse hypermetabolic bone metastases -Initiation of Palbociclib 10/20/2014; Faslodex 10/23/2014. -Palbociclib placed on hold beginning 11/07/2014 due to neutropenia. -Palbociclib resumed 11/27/2014 on an every other day schedule -Palbociclib resumed 01/04/2015 at a reduced dose of 100 mg daily for 21 days. -Palbociclib beginning 02/12/2015 at a dose of 100 mg daily for 14 days on/14 days off. -PET scan 05/18/2015 with diffuse hypermetabolic bone lesions, increased metabolic activity compared to the PET scan from January 2016 -Palbociclib resumed 06/07/2015, Faslodex continued 06/12/2015 3. BRCA1 variant felt to be a polymorphism  4. Pain/tenderness at the left low anterolateral chest wall-she completed palliative radiation on 05/28/2012 with improvement in the pain. She was also treated with radiation to the thoracic spine.  5. Anemia/leukopenia-likely secondary to breast cancer involving the bone marrow and radiation /afinitor -the anemia is improved  6. History of mildly elevated liver enzymes-? Related to afinitor,? Secondary to metastatic breast  cancer 7. Neutropenia secondary to  Palbociclib. Improved. 8. Pain at the left shoulder and mid back-a plain x-ray of the left shoulder 06/12/2015 revealed a sclerotic density at the left scapula adjacent to the glenoid, there is metastatic disease involving the humeral head and left scapula on the PET scan 05/18/2015.    Disposition:  [name] has increased pain at the left shoulder and mid back. I suspect the pain is related to metastatic breast cancer. I will refer her to radiation oncology to consider palliative radiation to the left shoulder. We will obtain plain x-rays of the thoracic spine today to look for evidence of a compression fracture. Her pain in the back is improved today compared to when this started on 07/04/2015. She has received previous radiation to the spine. The plan is to continue ibrance and Faslodex. She received Faslodex today.  I reviewed the left shoulder x-ray from 06/12/2015 and the most recent PET images with [name] and her husband.  We prescribed Dilaudid to use as needed for pain. She will return for an office visit in one month. She will be scheduled for a nadir CBC in the interim.  Stacy Coder, MD  07/09/2015  11:53 AM

## 2015-07-09 NOTE — Telephone Encounter (Signed)
-----   Message from Ladell Pier, MD sent at 07/09/2015  4:20 PM EDT ----- Please call patient, no compression fracture or cancer seen on xray, call for persistent back pain and we will schedule an MRI

## 2015-07-10 ENCOUNTER — Telehealth: Payer: Self-pay | Admitting: Oncology

## 2015-07-10 ENCOUNTER — Encounter: Payer: Self-pay | Admitting: Oncology

## 2015-07-10 NOTE — Telephone Encounter (Signed)
Faxed medical records to [facility] @ [phone]

## 2015-07-10 NOTE — Progress Notes (Signed)
i faxed prior auth form for ibrance to [employer]. The patient left message about her insurance started over on 10/1, and group# changed- all else the same

## 2015-07-14 ENCOUNTER — Encounter: Payer: Self-pay | Admitting: Oncology

## 2015-07-14 NOTE — Progress Notes (Signed)
Per jp at [employer] 07/14/15-07/13/16 approved ibrance  Pa# [id number]. I will let the patient know.

## 2015-07-15 DIAGNOSIS — C7951 Secondary malignant neoplasm of bone: Secondary | ICD-10-CM | POA: Diagnosis not present

## 2015-07-15 DIAGNOSIS — C50911 Malignant neoplasm of unspecified site of right female breast: Secondary | ICD-10-CM | POA: Diagnosis not present

## 2015-07-16 DIAGNOSIS — C7951 Secondary malignant neoplasm of bone: Secondary | ICD-10-CM | POA: Diagnosis not present

## 2015-07-17 DIAGNOSIS — C7951 Secondary malignant neoplasm of bone: Secondary | ICD-10-CM | POA: Diagnosis not present

## 2015-07-17 DIAGNOSIS — Z510 Encounter for antineoplastic radiation therapy: Secondary | ICD-10-CM | POA: Diagnosis not present

## 2015-07-17 DIAGNOSIS — C50911 Malignant neoplasm of unspecified site of right female breast: Secondary | ICD-10-CM | POA: Diagnosis not present

## 2015-07-20 DIAGNOSIS — C7951 Secondary malignant neoplasm of bone: Secondary | ICD-10-CM | POA: Diagnosis not present

## 2015-07-22 ENCOUNTER — Other Ambulatory Visit (HOSPITAL_BASED_OUTPATIENT_CLINIC_OR_DEPARTMENT_OTHER): Payer: Medicare Other

## 2015-07-22 DIAGNOSIS — C50911 Malignant neoplasm of unspecified site of right female breast: Secondary | ICD-10-CM | POA: Diagnosis not present

## 2015-07-22 DIAGNOSIS — C50919 Malignant neoplasm of unspecified site of unspecified female breast: Secondary | ICD-10-CM

## 2015-07-22 DIAGNOSIS — C7951 Secondary malignant neoplasm of bone: Principal | ICD-10-CM

## 2015-07-22 LAB — CBC WITH DIFFERENTIAL/PLATELET
BASO%: 0.4 % (ref 0.0–2.0)
Basophils Absolute: 0 10*3/uL (ref 0.0–0.1)
EOS ABS: 0 10*3/uL (ref 0.0–0.5)
EOS%: 1.5 % (ref 0.0–7.0)
HEMATOCRIT: 37.5 % (ref 34.8–46.6)
HGB: 12.8 g/dL (ref 11.6–15.9)
LYMPH#: 0.8 10*3/uL — AB (ref 0.9–3.3)
LYMPH%: 39.6 % (ref 14.0–49.7)
MCH: 33.8 pg (ref 25.1–34.0)
MCHC: 34.2 g/dL (ref 31.5–36.0)
MCV: 98.6 fL (ref 79.5–101.0)
MONO#: 0.1 10*3/uL (ref 0.1–0.9)
MONO%: 7.4 % (ref 0.0–14.0)
NEUT%: 51.1 % (ref 38.4–76.8)
NEUTROS ABS: 1 10*3/uL — AB (ref 1.5–6.5)
PLATELETS: 226 10*3/uL (ref 145–400)
RBC: 3.8 10*6/uL (ref 3.70–5.45)
RDW: 14.4 % (ref 11.2–14.5)
WBC: 2 10*3/uL — AB (ref 3.9–10.3)

## 2015-07-23 DIAGNOSIS — Z510 Encounter for antineoplastic radiation therapy: Secondary | ICD-10-CM | POA: Diagnosis not present

## 2015-07-23 DIAGNOSIS — C7951 Secondary malignant neoplasm of bone: Secondary | ICD-10-CM | POA: Diagnosis not present

## 2015-07-23 DIAGNOSIS — C50911 Malignant neoplasm of unspecified site of right female breast: Secondary | ICD-10-CM | POA: Diagnosis not present

## 2015-07-24 ENCOUNTER — Other Ambulatory Visit: Payer: Self-pay | Admitting: *Deleted

## 2015-07-24 DIAGNOSIS — Z510 Encounter for antineoplastic radiation therapy: Secondary | ICD-10-CM | POA: Diagnosis not present

## 2015-07-24 DIAGNOSIS — C7951 Secondary malignant neoplasm of bone: Secondary | ICD-10-CM

## 2015-07-24 DIAGNOSIS — C50911 Malignant neoplasm of unspecified site of right female breast: Secondary | ICD-10-CM | POA: Diagnosis not present

## 2015-07-24 DIAGNOSIS — C50919 Malignant neoplasm of unspecified site of unspecified female breast: Secondary | ICD-10-CM

## 2015-07-24 MED ORDER — PALBOCICLIB 100 MG PO CAPS: 100.0000 mg | ORAL_CAPSULE | Freq: Every day | ORAL | Status: DC

## 2015-07-27 DIAGNOSIS — C50911 Malignant neoplasm of unspecified site of right female breast: Secondary | ICD-10-CM | POA: Diagnosis not present

## 2015-07-27 DIAGNOSIS — Z510 Encounter for antineoplastic radiation therapy: Secondary | ICD-10-CM | POA: Diagnosis not present

## 2015-07-27 DIAGNOSIS — C7951 Secondary malignant neoplasm of bone: Secondary | ICD-10-CM | POA: Diagnosis not present

## 2015-07-28 DIAGNOSIS — C7951 Secondary malignant neoplasm of bone: Secondary | ICD-10-CM | POA: Diagnosis not present

## 2015-07-28 DIAGNOSIS — C50911 Malignant neoplasm of unspecified site of right female breast: Secondary | ICD-10-CM | POA: Diagnosis not present

## 2015-07-28 DIAGNOSIS — Z510 Encounter for antineoplastic radiation therapy: Secondary | ICD-10-CM | POA: Diagnosis not present

## 2015-07-29 DIAGNOSIS — C7951 Secondary malignant neoplasm of bone: Secondary | ICD-10-CM | POA: Diagnosis not present

## 2015-07-29 DIAGNOSIS — Z510 Encounter for antineoplastic radiation therapy: Secondary | ICD-10-CM | POA: Diagnosis not present

## 2015-07-29 DIAGNOSIS — C50911 Malignant neoplasm of unspecified site of right female breast: Secondary | ICD-10-CM | POA: Diagnosis not present

## 2015-07-30 DIAGNOSIS — Z510 Encounter for antineoplastic radiation therapy: Secondary | ICD-10-CM | POA: Diagnosis not present

## 2015-07-30 DIAGNOSIS — C50911 Malignant neoplasm of unspecified site of right female breast: Secondary | ICD-10-CM | POA: Diagnosis not present

## 2015-07-30 DIAGNOSIS — C7951 Secondary malignant neoplasm of bone: Secondary | ICD-10-CM | POA: Diagnosis not present

## 2015-07-31 DIAGNOSIS — C7951 Secondary malignant neoplasm of bone: Secondary | ICD-10-CM | POA: Diagnosis not present

## 2015-07-31 DIAGNOSIS — Z510 Encounter for antineoplastic radiation therapy: Secondary | ICD-10-CM | POA: Diagnosis not present

## 2015-07-31 DIAGNOSIS — C50911 Malignant neoplasm of unspecified site of right female breast: Secondary | ICD-10-CM | POA: Diagnosis not present

## 2015-08-03 DIAGNOSIS — C7951 Secondary malignant neoplasm of bone: Secondary | ICD-10-CM | POA: Diagnosis not present

## 2015-08-03 DIAGNOSIS — C50911 Malignant neoplasm of unspecified site of right female breast: Secondary | ICD-10-CM | POA: Diagnosis not present

## 2015-08-03 DIAGNOSIS — Z510 Encounter for antineoplastic radiation therapy: Secondary | ICD-10-CM | POA: Diagnosis not present

## 2015-08-04 DIAGNOSIS — C50911 Malignant neoplasm of unspecified site of right female breast: Secondary | ICD-10-CM | POA: Diagnosis not present

## 2015-08-04 DIAGNOSIS — C7951 Secondary malignant neoplasm of bone: Secondary | ICD-10-CM | POA: Diagnosis not present

## 2015-08-04 DIAGNOSIS — Z510 Encounter for antineoplastic radiation therapy: Secondary | ICD-10-CM | POA: Diagnosis not present

## 2015-08-05 ENCOUNTER — Other Ambulatory Visit: Payer: Self-pay | Admitting: *Deleted

## 2015-08-05 DIAGNOSIS — Z510 Encounter for antineoplastic radiation therapy: Secondary | ICD-10-CM | POA: Diagnosis not present

## 2015-08-05 DIAGNOSIS — C7951 Secondary malignant neoplasm of bone: Secondary | ICD-10-CM | POA: Diagnosis not present

## 2015-08-05 DIAGNOSIS — C50911 Malignant neoplasm of unspecified site of right female breast: Secondary | ICD-10-CM | POA: Diagnosis not present

## 2015-08-05 NOTE — Telephone Encounter (Signed)
VM message received from patient @ 9:19 am requesting refill on her IBrance.. She has enough until next Wednesday.

## 2015-08-05 NOTE — Telephone Encounter (Signed)
Returned call to pt, she needs 3 additional Ibrance tablets to complete this cycle. Please send #17 tablets with next refill.

## 2015-08-06 ENCOUNTER — Ambulatory Visit (HOSPITAL_BASED_OUTPATIENT_CLINIC_OR_DEPARTMENT_OTHER): Payer: 59 | Admitting: Nurse Practitioner

## 2015-08-06 ENCOUNTER — Telehealth: Payer: Self-pay | Admitting: Oncology

## 2015-08-06 ENCOUNTER — Other Ambulatory Visit: Payer: Self-pay | Admitting: *Deleted

## 2015-08-06 ENCOUNTER — Ambulatory Visit (HOSPITAL_BASED_OUTPATIENT_CLINIC_OR_DEPARTMENT_OTHER): Payer: Medicare Other

## 2015-08-06 ENCOUNTER — Other Ambulatory Visit (HOSPITAL_BASED_OUTPATIENT_CLINIC_OR_DEPARTMENT_OTHER): Payer: Medicare Other

## 2015-08-06 VITALS — BP 124/59 | HR 67 | Temp 98.0°F | Resp 18 | Ht 65.0 in | Wt 164.5 lb

## 2015-08-06 DIAGNOSIS — D701 Agranulocytosis secondary to cancer chemotherapy: Secondary | ICD-10-CM

## 2015-08-06 DIAGNOSIS — C50911 Malignant neoplasm of unspecified site of right female breast: Secondary | ICD-10-CM | POA: Diagnosis not present

## 2015-08-06 DIAGNOSIS — Z9889 Other specified postprocedural states: Secondary | ICD-10-CM

## 2015-08-06 DIAGNOSIS — Z5111 Encounter for antineoplastic chemotherapy: Secondary | ICD-10-CM

## 2015-08-06 DIAGNOSIS — R21 Rash and other nonspecific skin eruption: Secondary | ICD-10-CM | POA: Diagnosis not present

## 2015-08-06 DIAGNOSIS — C7951 Secondary malignant neoplasm of bone: Secondary | ICD-10-CM

## 2015-08-06 DIAGNOSIS — Z23 Encounter for immunization: Secondary | ICD-10-CM | POA: Diagnosis not present

## 2015-08-06 DIAGNOSIS — C50919 Malignant neoplasm of unspecified site of unspecified female breast: Secondary | ICD-10-CM

## 2015-08-06 LAB — CBC WITH DIFFERENTIAL/PLATELET
BASO%: 0.3 % (ref 0.0–2.0)
BASOS ABS: 0 10*3/uL (ref 0.0–0.1)
EOS%: 0.6 % (ref 0.0–7.0)
Eosinophils Absolute: 0 10*3/uL (ref 0.0–0.5)
HEMATOCRIT: 36.4 % (ref 34.8–46.6)
HGB: 12.7 g/dL (ref 11.6–15.9)
LYMPH%: 19.2 % (ref 14.0–49.7)
MCH: 33.7 pg (ref 25.1–34.0)
MCHC: 34.9 g/dL (ref 31.5–36.0)
MCV: 96.6 fL (ref 79.5–101.0)
MONO#: 0.2 10*3/uL (ref 0.1–0.9)
MONO%: 5.9 % (ref 0.0–14.0)
NEUT#: 2.6 10*3/uL (ref 1.5–6.5)
NEUT%: 74 % (ref 38.4–76.8)
PLATELETS: 263 10*3/uL (ref 145–400)
RBC: 3.77 10*6/uL (ref 3.70–5.45)
RDW: 14.3 % (ref 11.2–14.5)
WBC: 3.5 10*3/uL — ABNORMAL LOW (ref 3.9–10.3)
lymph#: 0.7 10*3/uL — ABNORMAL LOW (ref 0.9–3.3)

## 2015-08-06 LAB — BASIC METABOLIC PANEL (CC13)
ANION GAP: 9 meq/L (ref 3–11)
BUN: 25 mg/dL (ref 7.0–26.0)
CALCIUM: 11.3 mg/dL — AB (ref 8.4–10.4)
CO2: 24 mEq/L (ref 22–29)
CREATININE: 1.1 mg/dL (ref 0.6–1.1)
Chloride: 105 mEq/L (ref 98–109)
EGFR: 56 mL/min/{1.73_m2} — ABNORMAL LOW (ref >90)
GLUCOSE: 87 mg/dL (ref 70–140)
POTASSIUM: 4.6 meq/L (ref 3.5–5.1)
Sodium: 138 mEq/L (ref 136–145)

## 2015-08-06 MED ORDER — HYDROCODONE-ACETAMINOPHEN 7.5-325 MG PO TABS: 1.0000 | ORAL_TABLET | ORAL | Status: DC | PRN

## 2015-08-06 MED ORDER — FULVESTRANT 250 MG/5ML IM SOLN
500.0000 mg | Freq: Once | INTRAMUSCULAR | Status: AC
  Administered 2015-08-06: 500 mg via INTRAMUSCULAR
  Filled 2015-08-06: qty 10

## 2015-08-06 MED ORDER — INFLUENZA VAC SPLIT QUAD 0.5 ML IM SUSY
0.5000 mL | PREFILLED_SYRINGE | Freq: Once | INTRAMUSCULAR | Status: AC
  Administered 2015-08-06: 0.5 mL via INTRAMUSCULAR
  Filled 2015-08-06: qty 0.5

## 2015-08-06 MED ORDER — PALBOCICLIB 100 MG PO CAPS: 100.0000 mg | ORAL_CAPSULE | Freq: Every day | ORAL | Status: DC

## 2015-08-06 MED ORDER — DENOSUMAB 120 MG/1.7ML ~~LOC~~ SOLN
120.0000 mg | Freq: Once | SUBCUTANEOUS | Status: AC
  Administered 2015-08-06: 120 mg via SUBCUTANEOUS
  Filled 2015-08-06: qty 1.7

## 2015-08-06 NOTE — Telephone Encounter (Signed)
Refill request for [facility] sent electronically. Pt's needs #3 tablets to complete current cycle. Has been using new shipment to finish old cycle for several cycles after being held and dose changed etc.

## 2015-08-06 NOTE — Telephone Encounter (Signed)
Gave patient avs report and appointments for December  °

## 2015-08-06 NOTE — Progress Notes (Addendum)
[place] OFFICE PROGRESS NOTE   Diagnosis:  Breast cancer  INTERVAL HISTORY:   Stacy Horn returns as scheduled. She completed the course of radiation to the left shoulder 08/05/2015. She has noted slight improvement in the pain and notes improved range of motion. She feels the hydrocodone works better than Dilaudid. She developed a skin rash related to radiation. Back pain overall is better. She notes that it worsens with prolonged standing. She denies nausea/vomiting. No mouth sores. No diarrhea.  Objective:  Vital signs in last 24 hours:  Blood pressure 124/59, pulse 67, temperature 98 F (36.7 C), temperature source Oral, resp. rate 18, height _0  (1.651 m), weight 164 lb 8 oz (74.617 kg), last menstrual period 10/08/2014, SpO2 99 %.    HEENT: No thrush or ulcers. Lymphatics: 1 cm linear left axillary lymph node. Resp: Lungs clear bilaterally. Cardio: Regular rate and rhythm. GI: Abdomen soft and nontender. No hepatomegaly. Vascular: No leg edema. Skin: Rash left upper anterior and posterior chest consistent with radiation dermatitis.    Lab Results:  Lab Results  Component Value Date   WBC 3.5* 08/06/2015   HGB 12.7 08/06/2015   HCT 36.4 08/06/2015   MCV 96.6 08/06/2015   PLT 263 08/06/2015   NEUTROABS 2.6 08/06/2015    Imaging:  No results found.  Medications: I have reviewed the patient's current medications.  Assessment/Plan: 1. Multicentric invasive lobular carcinoma of the right breast diagnosed in December of 1999, a right mastectomy followed by adjuvant [facility] chemotherapy, 5 years of tamoxifen, and 5 years of Femara. The Femara was completed in June of 2010. The right breast mass excision in January 2000 was ER positive, PR positive, and HER-2 negative  2. Pain at the left groin area with a CT of the pelvis on 02/07/12 confirming a destructive lytic lesion at the left pubic symphysis, status post a CT-guided biopsy on 02/16/12 confirming  metastatic carcinoma, focally ER positive .  -PET scan 03/07/2012 confirmed multiple hypermetabolic bone metastases with no other evidence of metastatic disease  -Status post palliative radiation to the pelvis beginning on 03/12/2012  -Initiation of Arimidex on 03/27/2012 -initiation of every three-month xgeva on 04/04/2012  -PET scan September 14 [id number] new hypermetabolic bone lesions  -Initiation of Faslodex 09/27/2012  -Restaging PET scan 04/08/2013 with evidence of disease progression in the bones  -Initiation of Aromasin/afinitor 05/29/2013  -Iliac biopsy at [facility] 05/22/2013 confirming HER-2 negative metastatic breast  -PET scan 11/18/2013 with a decrease in hypermetabolic activity  -PET scan 10/08/2014 with progressive diffuse hypermetabolic bone metastases -Initiation of Palbociclib 10/20/2014; Faslodex 10/23/2014. -Palbociclib placed on hold beginning 11/07/2014 due to neutropenia. -Palbociclib resumed 11/27/2014 on an every other day schedule -Palbociclib resumed 01/04/2015 at a reduced dose of 100 mg daily for 21 days. -Palbociclib beginning 02/12/2015 at a dose of 100 mg daily for 14 days on/14 days off. -PET scan 05/18/2015 with diffuse hypermetabolic bone lesions, increased metabolic activity compared to the PET scan from January 2016 -Palbociclib resumed 06/07/2015, Faslodex continued 06/12/2015 3. BRCA1 variant felt to be a polymorphism  4. Pain/tenderness at the left low anterolateral chest wall-she completed palliative radiation on 05/28/2012 with improvement in the pain. She was also treated with radiation to the thoracic spine.  5. Anemia/leukopenia-likely secondary to breast cancer involving the bone marrow and radiation /afinitor -the anemia is improved  6. History of mildly elevated liver enzymes-? Related to afinitor,? Secondary to metastatic breast cancer 7. Neutropenia secondary to Palbociclib. Improved. 8. Pain at the  left shoulder and mid back-a  plain x-ray of the left shoulder 06/12/2015 revealed a sclerotic density at the left scapula adjacent to the glenoid, there is metastatic disease involving the humeral head and left scapula on the PET scan 05/18/2015. She completed a course of radiation to the left shoulder on 08/05/2015.    Disposition: [name] appears stable. She will continue Ibrance and Faslodex. She will receive a Faslodex injection today. The plan is to obtain a restaging PET scan at the end of December which will be a four-month interval.  She feels that hydrocodone works better than Dilaudid. She was provided with a new hydrocodone prescription today.  She will return for a follow-up visit in one month. She will contact the office in the interim with any problems.  She will receive the influenza vaccine today.  Patient seen with Dr. Benay Spice.  Stacy Horn ANP/GNP-BC   08/06/2015  1:58 PM  This was a shared visit with Stacy Horn. The plan is to continue Ibrance and Faslodex. She will undergo a restaging PET scan in late December. The left shoulder pain has improved with palliative radiation.  Stacy Horn, M.D.

## 2015-08-07 ENCOUNTER — Telehealth: Payer: Self-pay

## 2015-08-07 ENCOUNTER — Telehealth: Payer: Self-pay | Admitting: Nurse Practitioner

## 2015-08-07 ENCOUNTER — Other Ambulatory Visit: Payer: Self-pay | Admitting: Nurse Practitioner

## 2015-08-07 ENCOUNTER — Telehealth: Payer: Self-pay | Admitting: Oncology

## 2015-08-07 DIAGNOSIS — C7951 Secondary malignant neoplasm of bone: Principal | ICD-10-CM

## 2015-08-07 DIAGNOSIS — C50919 Malignant neoplasm of unspecified site of unspecified female breast: Secondary | ICD-10-CM

## 2015-08-07 NOTE — Telephone Encounter (Signed)
-----   Message from Owens Shark, NP sent at 08/07/2015  9:55 AM EST ----- Please let her know calcium was elevated on lab work done yesterday. Did she take her calcium supplement yesterday prior to the lab draw?

## 2015-08-07 NOTE — Telephone Encounter (Signed)
s.w. pt and advised on 11.22 appt.Marland KitchenMarland KitchenMarland KitchenMarland Kitchenpt ok and aware

## 2015-08-07 NOTE — Telephone Encounter (Signed)
I spoke with Stacy Horn regarding the elevated calcium. She will discontinue oral calcium. We will obtain follow-up labs on 08/11/2015. She understands to wait for results.

## 2015-08-07 NOTE — Telephone Encounter (Signed)
Call placed to Ms. Almeyda to ask if she took her calcium before she had her blood drawn she stated no. Relayed this to  Owens Shark NP , Informed me to tell Stacy Horn to discontinue the calcium for now and we will call her to come in to repeat the calcium level. Ms. Venable verbalized understanding.

## 2015-08-11 ENCOUNTER — Other Ambulatory Visit (HOSPITAL_BASED_OUTPATIENT_CLINIC_OR_DEPARTMENT_OTHER): Payer: Medicare Other

## 2015-08-11 DIAGNOSIS — C7951 Secondary malignant neoplasm of bone: Secondary | ICD-10-CM

## 2015-08-11 DIAGNOSIS — C50919 Malignant neoplasm of unspecified site of unspecified female breast: Secondary | ICD-10-CM

## 2015-08-11 LAB — COMPREHENSIVE METABOLIC PANEL (CC13)
ALBUMIN: 3.8 g/dL (ref 3.5–5.0)
ALK PHOS: 67 U/L (ref 40–150)
ALT: 23 U/L (ref 0–55)
ANION GAP: 9 meq/L (ref 3–11)
AST: 29 U/L (ref 5–34)
BUN: 16.7 mg/dL (ref 7.0–26.0)
CALCIUM: 10.2 mg/dL (ref 8.4–10.4)
CO2: 25 mEq/L (ref 22–29)
Chloride: 107 mEq/L (ref 98–109)
Creatinine: 1 mg/dL (ref 0.6–1.1)
EGFR: 64 mL/min/{1.73_m2} — ABNORMAL LOW (ref >90)
Glucose: 136 mg/dl (ref 70–140)
Potassium: 4.2 mEq/L (ref 3.5–5.1)
Sodium: 141 mEq/L (ref 136–145)
Total Bilirubin: 0.47 mg/dL (ref 0.20–1.20)
Total Protein: 6.9 g/dL (ref 6.4–8.3)

## 2015-08-21 ENCOUNTER — Telehealth: Payer: Self-pay | Admitting: *Deleted

## 2015-08-21 NOTE — Telephone Encounter (Signed)
Spoke with [facility]. They will coordinate delivery of 3 tablets to complete order to start 08/30/15.

## 2015-08-26 DIAGNOSIS — Z6827 Body mass index (BMI) 27.0-27.9, adult: Secondary | ICD-10-CM | POA: Diagnosis not present

## 2015-08-26 DIAGNOSIS — N952 Postmenopausal atrophic vaginitis: Secondary | ICD-10-CM | POA: Diagnosis not present

## 2015-08-26 DIAGNOSIS — Z01419 Encounter for gynecological examination (general) (routine) without abnormal findings: Secondary | ICD-10-CM | POA: Diagnosis not present

## 2015-09-01 ENCOUNTER — Telehealth: Payer: Self-pay

## 2015-09-01 NOTE — Telephone Encounter (Signed)
ok 

## 2015-09-01 NOTE — Telephone Encounter (Signed)
[facility] call back [phone] fax # [phone]. They are asking for an ibrance refill order.

## 2015-09-02 ENCOUNTER — Telehealth: Payer: Self-pay | Admitting: *Deleted

## 2015-09-02 NOTE — Telephone Encounter (Signed)
Spoke with pt, she was shipped #14 capsules followed by 3 capsules. She has sufficient quantity to complete this cycle of Ibrance.

## 2015-09-03 ENCOUNTER — Ambulatory Visit (HOSPITAL_BASED_OUTPATIENT_CLINIC_OR_DEPARTMENT_OTHER): Payer: 59 | Admitting: Oncology

## 2015-09-03 ENCOUNTER — Ambulatory Visit (HOSPITAL_BASED_OUTPATIENT_CLINIC_OR_DEPARTMENT_OTHER): Payer: Medicare Other

## 2015-09-03 ENCOUNTER — Telehealth: Payer: Self-pay | Admitting: Oncology

## 2015-09-03 ENCOUNTER — Other Ambulatory Visit (HOSPITAL_BASED_OUTPATIENT_CLINIC_OR_DEPARTMENT_OTHER): Payer: Medicare Other

## 2015-09-03 VITALS — BP 111/60 | HR 77 | Temp 98.0°F | Resp 18 | Ht 65.0 in | Wt 166.9 lb

## 2015-09-03 DIAGNOSIS — C7951 Secondary malignant neoplasm of bone: Secondary | ICD-10-CM

## 2015-09-03 DIAGNOSIS — Z5111 Encounter for antineoplastic chemotherapy: Secondary | ICD-10-CM | POA: Diagnosis present

## 2015-09-03 DIAGNOSIS — C50919 Malignant neoplasm of unspecified site of unspecified female breast: Secondary | ICD-10-CM | POA: Diagnosis not present

## 2015-09-03 DIAGNOSIS — D701 Agranulocytosis secondary to cancer chemotherapy: Secondary | ICD-10-CM | POA: Diagnosis not present

## 2015-09-03 DIAGNOSIS — Z9889 Other specified postprocedural states: Secondary | ICD-10-CM

## 2015-09-03 LAB — COMPREHENSIVE METABOLIC PANEL
ALT: 29 U/L (ref 0–55)
AST: 35 U/L — AB (ref 5–34)
Albumin: 4 g/dL (ref 3.5–5.0)
Alkaline Phosphatase: 66 U/L (ref 40–150)
Anion Gap: 9 mEq/L (ref 3–11)
BUN: 19.9 mg/dL (ref 7.0–26.0)
CHLORIDE: 107 meq/L (ref 98–109)
CO2: 25 meq/L (ref 22–29)
Calcium: 10.6 mg/dL — ABNORMAL HIGH (ref 8.4–10.4)
Creatinine: 1 mg/dL (ref 0.6–1.1)
EGFR: 63 mL/min/{1.73_m2} — AB (ref >90)
GLUCOSE: 108 mg/dL (ref 70–140)
POTASSIUM: 4.3 meq/L (ref 3.5–5.1)
SODIUM: 141 meq/L (ref 136–145)
Total Bilirubin: 0.48 mg/dL (ref 0.20–1.20)
Total Protein: 7.6 g/dL (ref 6.4–8.3)

## 2015-09-03 LAB — CBC WITH DIFFERENTIAL/PLATELET
BASO%: 0.4 % (ref 0.0–2.0)
BASOS ABS: 0 10*3/uL (ref 0.0–0.1)
EOS ABS: 0 10*3/uL (ref 0.0–0.5)
EOS%: 0.9 % (ref 0.0–7.0)
HCT: 35.1 % (ref 34.8–46.6)
HGB: 12.1 g/dL (ref 11.6–15.9)
LYMPH%: 27.4 % (ref 14.0–49.7)
MCH: 33.6 pg (ref 25.1–34.0)
MCHC: 34.5 g/dL (ref 31.5–36.0)
MCV: 97.5 fL (ref 79.5–101.0)
MONO#: 0.2 10*3/uL (ref 0.1–0.9)
MONO%: 7.3 % (ref 0.0–14.0)
NEUT#: 1.5 10*3/uL (ref 1.5–6.5)
NEUT%: 64 % (ref 38.4–76.8)
Platelets: 220 10*3/uL (ref 145–400)
RBC: 3.6 10*6/uL — ABNORMAL LOW (ref 3.70–5.45)
RDW: 14.3 % (ref 11.2–14.5)
WBC: 2.3 10*3/uL — ABNORMAL LOW (ref 3.9–10.3)
lymph#: 0.6 10*3/uL — ABNORMAL LOW (ref 0.9–3.3)

## 2015-09-03 MED ORDER — FULVESTRANT 250 MG/5ML IM SOLN
500.0000 mg | Freq: Once | INTRAMUSCULAR | Status: AC
  Administered 2015-09-03: 500 mg via INTRAMUSCULAR
  Filled 2015-09-03: qty 10

## 2015-09-03 MED ORDER — HYDROCODONE-ACETAMINOPHEN 7.5-325 MG PO TABS: 1.0000 | ORAL_TABLET | ORAL | Status: DC | PRN

## 2015-09-03 NOTE — Progress Notes (Signed)
[facility] OFFICE PROGRESS NOTE   Diagnosis: Breast cancer  INTERVAL HISTORY:   Stacy Horn returns as scheduled. The left shoulder pain improved following radiation. She has noted increased rash at the upper chest. She was last treated with Faslodex and Xgeva on 08/06/2015. She began the most recent cycle of Ibrance on 08/30/2015. She reports pain at the right scapula and chest wall intermittently. She takes hydrocodone every 4 hours for relief of pain.   Objective:  Vital signs in last 24 hours:  Blood pressure 111/60, pulse 77, temperature 98 F (36.7 C), temperature source Oral, resp. rate 18, height '5\' 5"'$  (1.651 m), weight 166 lb 14.4 oz (75.705 kg), last menstrual period 10/08/2014, SpO2 99 %.    HEENT: No thrush or ulcers Lymphatics: Soft mobile 1 cm node in the medial left axilla Resp: Lungs clear bilaterally Cardio: Regular rate and rhythm GI: No hepatomegaly Vascular: No leg edema  Skin: Erythematous slightly raised rash at the upper anterior chest, mild radiation erythema at the left upper posterior    Portacath/PICC-without erythema  Lab Results:  Lab Results  Component Value Date   WBC 2.3* 09/03/2015   HGB 12.1 09/03/2015   HCT 35.1 09/03/2015   MCV 97.5 09/03/2015   PLT 220 09/03/2015   NEUTROABS 1.5 09/03/2015   calcium 10.6, albumin 4.0    Medications: I have reviewed the patient's current medications.  Assessment/Plan: 1. Multicentric invasive lobular carcinoma of the right breast diagnosed in December of 1999, a right mastectomy followed by adjuvant [facility] chemotherapy, 5 years of tamoxifen, and 5 years of Femara. The Femara was completed in June of 2010. The right breast mass excision in January 2000 was ER positive, PR positive, and HER-2 negative  2. Pain at the left groin area with a CT of the pelvis on 02/07/12 confirming a destructive lytic lesion at the left pubic symphysis, status post a CT-guided biopsy on 02/16/12 confirming  metastatic carcinoma, focally ER positive .  -PET scan 03/07/2012 confirmed multiple hypermetabolic bone metastases with no other evidence of metastatic disease  -Status post palliative radiation to the pelvis beginning on 03/12/2012  -Initiation of Arimidex on 03/27/2012 -initiation of every three-month xgeva on 04/04/2012  -PET scan September 15 [id number] new hypermetabolic bone lesions  -Initiation of Faslodex 09/27/2012  -Restaging PET scan 04/08/2013 with evidence of disease progression in the bones  -Initiation of Aromasin/afinitor 05/29/2013  -Iliac biopsy at [facility] 05/22/2013 confirming HER-2 negative metastatic breast  -PET scan 11/18/2013 with a decrease in hypermetabolic activity  -PET scan 10/08/2014 with progressive diffuse hypermetabolic bone metastases -Initiation of Palbociclib 10/20/2014; Faslodex 10/23/2014. -Palbociclib placed on hold beginning 11/07/2014 due to neutropenia. -Palbociclib resumed 11/27/2014 on an every other day schedule -Palbociclib resumed 01/04/2015 at a reduced dose of 100 mg daily for 21 days. -Palbociclib beginning 02/12/2015 at a dose of 100 mg daily for 14 days on/14 days off. -PET scan 05/18/2015 with diffuse hypermetabolic bone lesions, increased metabolic activity compared to the PET scan from January 2016 -Palbociclib resumed 06/07/2015, Faslodex continued 06/12/2015 3. BRCA1 variant felt to be a polymorphism  4. Pain/tenderness at the left low anterolateral chest wall-she completed palliative radiation on 05/28/2012 with improvement in the pain. She was also treated with radiation to the thoracic spine.  5. Anemia/leukopenia-likely secondary to breast cancer involving the bone marrow and radiation /afinitor -the anemia is improved  6. History of mildly elevated liver enzymes-? Related to afinitor,? Secondary to metastatic breast cancer 7. Neutropenia secondary to Palbociclib. Improved.  8. Pain at the left shoulder and mid back-a  plain x-ray of the left shoulder 06/12/2015 revealed a sclerotic density at the left scapula adjacent to the glenoid, there is metastatic disease involving the humeral head and left scapula on the PET scan 05/18/2015. She completed a course of radiation to the left shoulder on 08/05/2015. 9. Mild hypercalcemia-asymptomatic   Disposition:  Stacy Horn appears stable. The plan is to continue Ibrance and Faslodex. She will be scheduled for a restaging PET scan and office visit 09/17/2015. The calcium has been mildly elevated on multiple occasions. This is most likely secondary to breast cancer. She did not take a calcium supplement prior to the laboratory draw today. She will contact us for symptoms of hypercalcemia.    [name], MD  09/03/2015  12:03 PM

## 2015-09-03 NOTE — Telephone Encounter (Signed)
per pof to sch pt appt-gave pt copy of avs °

## 2015-09-03 NOTE — Patient Instructions (Signed)

## 2015-09-03 NOTE — Telephone Encounter (Signed)
per pof to sch pt appt-gave pt copy of avs-adv [facility] will call to sch trmt °

## 2015-09-15 ENCOUNTER — Other Ambulatory Visit: Payer: Self-pay | Admitting: *Deleted

## 2015-09-15 ENCOUNTER — Telehealth: Payer: Self-pay | Admitting: *Deleted

## 2015-09-15 NOTE — Telephone Encounter (Signed)
Voicemail: "Radiology called, can't do test Thursday and scan will be done 09-22-2015.  I am scheduled to see Dr. Benay Spice after scans.  The 09-17-2015 F/U appointment needs to be rescheduled after the 09-22-2015 scan."

## 2015-09-15 NOTE — Telephone Encounter (Signed)
POF sent to schedulers with new date/time per MD

## 2015-09-16 ENCOUNTER — Telehealth: Payer: Self-pay | Admitting: Oncology

## 2015-09-16 NOTE — Telephone Encounter (Signed)
Spoke with patient and she is aware of her new appointment times

## 2015-09-17 ENCOUNTER — Other Ambulatory Visit: Payer: Medicare Other

## 2015-09-17 ENCOUNTER — Ambulatory Visit: Payer: Medicare Other | Admitting: Oncology

## 2015-09-22 ENCOUNTER — Ambulatory Visit (HOSPITAL_COMMUNITY)
Admission: RE | Admit: 2015-09-22 | Discharge: 2015-09-22 | Disposition: A | Discharge status: Home / Self Care | Payer: Medicare Other | Source: Ambulatory Visit | Attending: Oncology | Admitting: Oncology

## 2015-09-22 DIAGNOSIS — C419 Malignant neoplasm of bone and articular cartilage, unspecified: Secondary | ICD-10-CM | POA: Diagnosis not present

## 2015-09-22 DIAGNOSIS — E041 Nontoxic single thyroid nodule: Secondary | ICD-10-CM | POA: Diagnosis not present

## 2015-09-22 DIAGNOSIS — C7951 Secondary malignant neoplasm of bone: Secondary | ICD-10-CM | POA: Insufficient documentation

## 2015-09-22 DIAGNOSIS — K760 Fatty (change of) liver, not elsewhere classified: Secondary | ICD-10-CM | POA: Insufficient documentation

## 2015-09-22 DIAGNOSIS — C50919 Malignant neoplasm of unspecified site of unspecified female breast: Secondary | ICD-10-CM | POA: Diagnosis not present

## 2015-09-22 MED ORDER — FLUDEOXYGLUCOSE F - 18 (FDG) INJECTION
8.1000 | Freq: Once | INTRAVENOUS | Status: AC | PRN
  Administered 2015-09-22: 8.1 via INTRAVENOUS

## 2015-09-23 LAB — GLUCOSE, CAPILLARY: Glucose-Capillary: 87 mg/dL (ref 65–99)

## 2015-09-24 ENCOUNTER — Ambulatory Visit (HOSPITAL_BASED_OUTPATIENT_CLINIC_OR_DEPARTMENT_OTHER): Payer: 59 | Admitting: Oncology

## 2015-09-24 ENCOUNTER — Other Ambulatory Visit (HOSPITAL_BASED_OUTPATIENT_CLINIC_OR_DEPARTMENT_OTHER): Payer: Medicare Other

## 2015-09-24 ENCOUNTER — Telehealth: Payer: Self-pay | Admitting: Nurse Practitioner

## 2015-09-24 VITALS — BP 119/59 | HR 72 | Temp 98.6°F | Resp 18 | Ht 65.0 in | Wt 165.4 lb

## 2015-09-24 DIAGNOSIS — C50919 Malignant neoplasm of unspecified site of unspecified female breast: Secondary | ICD-10-CM | POA: Diagnosis not present

## 2015-09-24 DIAGNOSIS — C7951 Secondary malignant neoplasm of bone: Principal | ICD-10-CM

## 2015-09-24 DIAGNOSIS — D701 Agranulocytosis secondary to cancer chemotherapy: Secondary | ICD-10-CM

## 2015-09-24 LAB — COMPREHENSIVE METABOLIC PANEL
ALT: 27 U/L (ref 0–55)
ANION GAP: 8 meq/L (ref 3–11)
AST: 45 U/L — ABNORMAL HIGH (ref 5–34)
Albumin: 4.1 g/dL (ref 3.5–5.0)
Alkaline Phosphatase: 69 U/L (ref 40–150)
BUN: 15 mg/dL (ref 7.0–26.0)
CHLORIDE: 106 meq/L (ref 98–109)
CO2: 26 mEq/L (ref 22–29)
Calcium: 10.4 mg/dL (ref 8.4–10.4)
Creatinine: 0.8 mg/dL (ref 0.6–1.1)
EGFR: 78 mL/min/{1.73_m2} — AB (ref >90)
Glucose: 85 mg/dl (ref 70–140)
POTASSIUM: 5.3 meq/L — AB (ref 3.5–5.1)
Sodium: 140 mEq/L (ref 136–145)
Total Bilirubin: 0.38 mg/dL (ref 0.20–1.20)
Total Protein: 7.8 g/dL (ref 6.4–8.3)

## 2015-09-24 LAB — CBC WITH DIFFERENTIAL/PLATELET
BASO%: 1.3 % (ref 0.0–2.0)
Basophils Absolute: 0 10*3/uL (ref 0.0–0.1)
EOS ABS: 0 10*3/uL (ref 0.0–0.5)
EOS%: 0.9 % (ref 0.0–7.0)
HCT: 35.2 % (ref 34.8–46.6)
HGB: 12.3 g/dL (ref 11.6–15.9)
LYMPH%: 32.8 % (ref 14.0–49.7)
MCH: 33.9 pg (ref 25.1–34.0)
MCHC: 34.9 g/dL (ref 31.5–36.0)
MCV: 97 fL (ref 79.5–101.0)
MONO#: 0.3 10*3/uL (ref 0.1–0.9)
MONO%: 11.6 % (ref 0.0–14.0)
NEUT#: 1.2 10*3/uL — ABNORMAL LOW (ref 1.5–6.5)
NEUT%: 53.4 % (ref 38.4–76.8)
PLATELETS: 159 10*3/uL (ref 145–400)
RBC: 3.63 10*6/uL — AB (ref 3.70–5.45)
RDW: 14.4 % (ref 11.2–14.5)
WBC: 2.3 10*3/uL — ABNORMAL LOW (ref 3.9–10.3)
lymph#: 0.8 10*3/uL — ABNORMAL LOW (ref 0.9–3.3)

## 2015-09-24 LAB — GLUCOSE, CAPILLARY: Glucose-Capillary: 87 mg/dL (ref 65–99)

## 2015-09-24 MED ORDER — HYDROCODONE-ACETAMINOPHEN 7.5-325 MG PO TABS
1.0000 | ORAL_TABLET | ORAL | Status: DC | PRN
Start: 2015-09-24 — End: 2015-10-29

## 2015-09-24 MED ORDER — PALBOCICLIB 100 MG PO CAPS: 100.0000 mg | ORAL_CAPSULE | Freq: Every day | ORAL | Status: DC

## 2015-09-24 NOTE — Telephone Encounter (Signed)
per pof to sch pt appt-gave pt copy of avs °

## 2015-09-24 NOTE — Progress Notes (Signed)
[facility] OFFICE PROGRESS NOTE   Diagnosis: Breast cancer  INTERVAL HISTORY:   [name] returns as scheduled. She was last treated with Faslodex on 09/03/2015. She is scheduled to begin another cycle of Ibrance 09/27/2015. The left shoulder pain is much improved following radiation. She now has increased pain at the right low lateral chest wall. She is taking hydrocodone every 4 hours for relief of pain.  Objective:  Vital signs in last 24 hours:  Blood pressure 119/59, pulse 72, temperature 98.6 F (37 C), temperature source Oral, resp. rate 18, height _0  (1.651 m), weight 165 lb 6.4 oz (75.025 kg), last menstrual period 10/08/2014, SpO2 99 %.    HEENT: Neck without mass Lymphatics: No cervical, supra-clavicular, or right axillary nodes. Less than 1 cm mobile left axillary node. Resp: Lungs clear bilaterally Cardio: Regular rate and rhythm GI: No hepatomegaly Vascular: No leg edema Breasts: Status post bilateral mastectomy, no evidence for chest wall tumor recurrence   Lab Results:  Lab Results  Component Value Date   WBC 2.3* 09/24/2015   HGB 12.3 09/24/2015   HCT 35.2 09/24/2015   MCV 97.0 09/24/2015   PLT 159 09/24/2015   NEUTROABS 1.2* 09/24/2015     Imaging:  Nm Pet Image Restag (ps) Skull Base To Thigh  09/22/2015  CLINICAL DATA:  Subsequent treatment strategy for restaging of breast cancer. Bone metastasis. EXAM: NUCLEAR MEDICINE PET SKULL BASE TO THIGH TECHNIQUE: 8.1 mCi F-18 FDG was injected intravenously. Full-ring PET imaging was performed from the skull base to thigh after the radiotracer. CT data was obtained and used for attenuation correction and anatomic localization. FASTING BLOOD GLUCOSE:  Value: 87 mg/dl COMPARISON:  05/18/2015 FINDINGS: NECK No areas of abnormal hypermetabolism. CHEST No areas of abnormal hypermetabolism. ABDOMEN/PELVIS No areas of abnormal hypermetabolism. SKELETON Widespread hypermetabolic osseous metastasis  again identified. Index lesion within the sternal manubrium measures a S.U.V. max of 6.8 today versus a S.U.V. max of 7.6 on the prior. An index lesion within the left pubic bone, parasymphyseal region, measures a S.U.V. max of 14.1 today. Example image 181/series 4. Similar on CT, measuring a S.U.V. max of 9.0 on the prior exam (when remeasured). Index lesion within the L3 vertebral body measures a S.U.V. max of 8.9 today versus a S.U.V. max of 6.0 on the prior. CT IMAGES PERFORMED FOR ATTENUATION CORRECTION Right maxillary sinus mucous retention cyst or polyp. No cervical adenopathy. A left-sided thyroid nodule is hyper attenuating and measures 1.4 cm, similar. Not hypermetabolic. Bilateral breast implants. Mild hepatic steatosis. Left renal low-density lesion is likely a cyst. Colonic stool burden suggests constipation. Probable residual follicle in the right ovary at 2.2 cm, similar. IMPRESSION: 1. Slight progression of osseous metastasis, as evidenced by increased in hypermetabolism at index sites. 2. No extraosseous metastasis identified. 3. Left thyroid nodule which is indeterminate and not hypermetabolic. Recommend attention on follow-up. 4. Hepatic steatosis and sinus disease. Electronically Signed   By: Abigail Miyamoto M.D.   On: 09/22/2015 17:24   Pet images were reviewed with [name]  Medications: I have reviewed the patient's current medications.  Assessment/plan:  1. Multicentric invasive lobular carcinoma of the right breast diagnosed in December of 1999, a right mastectomy followed by adjuvant [facility] chemotherapy, 5 years of tamoxifen, and 5 years of Femara. The Femara was completed in June of 2010. The right breast mass excision in January 2000 was ER positive, PR positive, and HER-2 negative  2. Pain at the left groin area  with a CT of the pelvis on 02/07/12 confirming a destructive lytic lesion at the left pubic symphysis, status post a CT-guided biopsy on 02/16/12 confirming metastatic  carcinoma, focally ER positive .  -PET scan 03/07/2012 confirmed multiple hypermetabolic bone metastases with no other evidence of metastatic disease  -Status post palliative radiation to the pelvis beginning on 03/12/2012  -Initiation of Arimidex on 03/27/2012 -initiation of every three-month xgeva on 04/04/2012  -PET scan September 14 [id number] new hypermetabolic bone lesions  -Initiation of Faslodex 09/27/2012  -Restaging PET scan 04/08/2013 with evidence of disease progression in the bones  -Initiation of Aromasin/afinitor 05/29/2013  -Iliac biopsy at [facility] 05/22/2013 confirming HER-2 negative metastatic breast  -PET scan 11/18/2013 with a decrease in hypermetabolic activity  -PET scan 10/08/2014 with progressive diffuse hypermetabolic bone metastases -Initiation of Palbociclib 10/20/2014; Faslodex 10/23/2014. -Palbociclib placed on hold beginning 11/07/2014 due to neutropenia. -Palbociclib resumed 11/27/2014 on an every other day schedule -Palbociclib resumed 01/04/2015 at a reduced dose of 100 mg daily for 21 days. -Palbociclib beginning 02/12/2015 at a dose of 100 mg daily for 14 days on/14 days off. -PET scan 05/18/2015 with diffuse hypermetabolic bone lesions, increased metabolic activity compared to the PET scan from January 2016 -Palbociclib resumed 06/07/2015, Faslodex continued 06/12/2015 -PET scan 09/22/2015 with diffuse hypermetabolic bone lesions, increased metabolic activity compared to the PET scan 05/18/2015 3. BRCA1 variant felt to be a polymorphism  4. Pain/tenderness at the left low anterolateral chest wall-she completed palliative radiation on 05/28/2012 with improvement in the pain. She was also treated with radiation to the thoracic spine.  5. Anemia/leukopenia-likely secondary to breast cancer involving the bone marrow and radiation /afinitor -the anemia is improved  6. History of mildly elevated liver enzymes-? Related to afinitor,? Secondary to  metastatic breast cancer 7. Neutropenia secondary to Palbociclib. Improved. 8. Pain at the left shoulder and mid back-a plain x-ray of the left shoulder 06/12/2015 revealed a sclerotic density at the left scapula adjacent to the glenoid, there is metastatic disease involving the humeral head and left scapula on the PET scan 05/18/2015. She completed a course of radiation to the left shoulder on 08/05/2015. 9. Mild hypercalcemia-asymptomatic     Disposition:  Her overall status appears unchanged. We decided to continue the Ibrance/Faslodex. She will return for Faslodex on 10/01/2015. I will see her for an office visit 10/29/2015.  I will review the PET images in radiology to be sure there is not clear evidence of disease progression.  [name], MD  09/24/2015  12:09 PM

## 2015-10-01 ENCOUNTER — Ambulatory Visit (HOSPITAL_BASED_OUTPATIENT_CLINIC_OR_DEPARTMENT_OTHER): Payer: Medicare Other

## 2015-10-01 ENCOUNTER — Other Ambulatory Visit (HOSPITAL_BASED_OUTPATIENT_CLINIC_OR_DEPARTMENT_OTHER): Payer: Medicare Other

## 2015-10-01 VITALS — BP 118/69 | HR 66 | Temp 98.1°F

## 2015-10-01 DIAGNOSIS — C50911 Malignant neoplasm of unspecified site of right female breast: Secondary | ICD-10-CM | POA: Diagnosis present

## 2015-10-01 DIAGNOSIS — Z5111 Encounter for antineoplastic chemotherapy: Secondary | ICD-10-CM

## 2015-10-01 DIAGNOSIS — C7951 Secondary malignant neoplasm of bone: Secondary | ICD-10-CM

## 2015-10-01 DIAGNOSIS — Z9889 Other specified postprocedural states: Secondary | ICD-10-CM

## 2015-10-01 DIAGNOSIS — C50919 Malignant neoplasm of unspecified site of unspecified female breast: Secondary | ICD-10-CM

## 2015-10-01 LAB — CBC WITH DIFFERENTIAL/PLATELET
BASO%: 1 % (ref 0.0–2.0)
Basophils Absolute: 0 10*3/uL (ref 0.0–0.1)
EOS ABS: 0 10*3/uL (ref 0.0–0.5)
EOS%: 0.9 % (ref 0.0–7.0)
HCT: 35 % (ref 34.8–46.6)
HGB: 11.9 g/dL (ref 11.6–15.9)
LYMPH%: 25 % (ref 14.0–49.7)
MCH: 33.1 pg (ref 25.1–34.0)
MCHC: 34.1 g/dL (ref 31.5–36.0)
MCV: 97.1 fL (ref 79.5–101.0)
MONO#: 0.2 10*3/uL (ref 0.1–0.9)
MONO%: 7.3 % (ref 0.0–14.0)
NEUT#: 1.6 10*3/uL (ref 1.5–6.5)
NEUT%: 65.8 % (ref 38.4–76.8)
PLATELETS: 259 10*3/uL (ref 145–400)
RBC: 3.61 10*6/uL — AB (ref 3.70–5.45)
RDW: 14.7 % — ABNORMAL HIGH (ref 11.2–14.5)
WBC: 2.4 10*3/uL — ABNORMAL LOW (ref 3.9–10.3)
lymph#: 0.6 10*3/uL — ABNORMAL LOW (ref 0.9–3.3)

## 2015-10-01 MED ORDER — FULVESTRANT 250 MG/5ML IM SOLN
500.0000 mg | Freq: Once | INTRAMUSCULAR | Status: AC
  Administered 2015-10-01: 500 mg via INTRAMUSCULAR
  Filled 2015-10-01: qty 10

## 2015-10-01 MED ORDER — DENOSUMAB 120 MG/1.7ML ~~LOC~~ SOLN
120.0000 mg | Freq: Once | SUBCUTANEOUS | Status: AC
  Administered 2015-10-01: 120 mg via SUBCUTANEOUS
  Filled 2015-10-01: qty 1.7

## 2015-10-07 DIAGNOSIS — C50911 Malignant neoplasm of unspecified site of right female breast: Secondary | ICD-10-CM | POA: Diagnosis not present

## 2015-10-13 ENCOUNTER — Other Ambulatory Visit: Payer: Self-pay | Admitting: *Deleted

## 2015-10-13 DIAGNOSIS — C7951 Secondary malignant neoplasm of bone: Secondary | ICD-10-CM

## 2015-10-13 DIAGNOSIS — C50919 Malignant neoplasm of unspecified site of unspecified female breast: Secondary | ICD-10-CM

## 2015-10-13 MED ORDER — PALBOCICLIB 100 MG PO CAPS: 100.0000 mg | ORAL_CAPSULE | Freq: Every day | ORAL | Status: DC

## 2015-10-29 ENCOUNTER — Ambulatory Visit (HOSPITAL_BASED_OUTPATIENT_CLINIC_OR_DEPARTMENT_OTHER): Payer: 59 | Admitting: Nurse Practitioner

## 2015-10-29 ENCOUNTER — Ambulatory Visit (HOSPITAL_BASED_OUTPATIENT_CLINIC_OR_DEPARTMENT_OTHER): Payer: Medicare Other

## 2015-10-29 ENCOUNTER — Other Ambulatory Visit (HOSPITAL_BASED_OUTPATIENT_CLINIC_OR_DEPARTMENT_OTHER): Payer: Medicare Other

## 2015-10-29 ENCOUNTER — Telehealth: Payer: Self-pay | Admitting: Oncology

## 2015-10-29 VITALS — BP 121/57 | HR 61 | Temp 98.4°F | Resp 16 | Wt 164.1 lb

## 2015-10-29 DIAGNOSIS — D630 Anemia in neoplastic disease: Secondary | ICD-10-CM

## 2015-10-29 DIAGNOSIS — Z5111 Encounter for antineoplastic chemotherapy: Secondary | ICD-10-CM

## 2015-10-29 DIAGNOSIS — Z9889 Other specified postprocedural states: Secondary | ICD-10-CM

## 2015-10-29 DIAGNOSIS — C7951 Secondary malignant neoplasm of bone: Principal | ICD-10-CM

## 2015-10-29 DIAGNOSIS — D701 Agranulocytosis secondary to cancer chemotherapy: Secondary | ICD-10-CM

## 2015-10-29 DIAGNOSIS — C50919 Malignant neoplasm of unspecified site of unspecified female breast: Secondary | ICD-10-CM

## 2015-10-29 LAB — COMPREHENSIVE METABOLIC PANEL
ALBUMIN: 4 g/dL (ref 3.5–5.0)
ALK PHOS: 68 U/L (ref 40–150)
ALT: 29 U/L (ref 0–55)
AST: 37 U/L — AB (ref 5–34)
Anion Gap: 9 mEq/L (ref 3–11)
BUN: 18.4 mg/dL (ref 7.0–26.0)
CALCIUM: 10.6 mg/dL — AB (ref 8.4–10.4)
CO2: 26 mEq/L (ref 22–29)
Chloride: 106 mEq/L (ref 98–109)
Creatinine: 0.9 mg/dL (ref 0.6–1.1)
EGFR: 72 mL/min/{1.73_m2} — ABNORMAL LOW (ref >90)
Glucose: 90 mg/dl (ref 70–140)
POTASSIUM: 4.3 meq/L (ref 3.5–5.1)
Sodium: 141 mEq/L (ref 136–145)
Total Bilirubin: 0.44 mg/dL (ref 0.20–1.20)
Total Protein: 7.6 g/dL (ref 6.4–8.3)

## 2015-10-29 LAB — CBC WITH DIFFERENTIAL/PLATELET
BASO%: 0.7 % (ref 0.0–2.0)
BASOS ABS: 0 10*3/uL (ref 0.0–0.1)
EOS ABS: 0 10*3/uL (ref 0.0–0.5)
EOS%: 0.9 % (ref 0.0–7.0)
HCT: 35.9 % (ref 34.8–46.6)
HEMOGLOBIN: 11.9 g/dL (ref 11.6–15.9)
LYMPH%: 23.2 % (ref 14.0–49.7)
MCH: 32.9 pg (ref 25.1–34.0)
MCHC: 33.1 g/dL (ref 31.5–36.0)
MCV: 99.4 fL (ref 79.5–101.0)
MONO#: 0.2 10*3/uL (ref 0.1–0.9)
MONO%: 7.5 % (ref 0.0–14.0)
NEUT#: 1.8 10*3/uL (ref 1.5–6.5)
NEUT%: 67.7 % (ref 38.4–76.8)
Platelets: 275 10*3/uL (ref 145–400)
RBC: 3.61 10*6/uL — ABNORMAL LOW (ref 3.70–5.45)
RDW: 15 % — ABNORMAL HIGH (ref 11.2–14.5)
WBC: 2.6 10*3/uL — ABNORMAL LOW (ref 3.9–10.3)
lymph#: 0.6 10*3/uL — ABNORMAL LOW (ref 0.9–3.3)

## 2015-10-29 MED ORDER — HYDROCODONE-ACETAMINOPHEN 7.5-325 MG PO TABS: 1.0000 | ORAL_TABLET | ORAL | Status: DC | PRN

## 2015-10-29 MED ORDER — FULVESTRANT 250 MG/5ML IM SOLN
500.0000 mg | Freq: Once | INTRAMUSCULAR | Status: AC
  Administered 2015-10-29: 500 mg via INTRAMUSCULAR
  Filled 2015-10-29: qty 10

## 2015-10-29 NOTE — Telephone Encounter (Signed)
Appointments made and avs printed for patient °

## 2015-10-29 NOTE — Progress Notes (Addendum)
[facility] OFFICE PROGRESS NOTE   Diagnosis:   Breast cancer  INTERVAL HISTORY:   Stacy Horn returns as scheduled. She continues Ibrance/Faslodex. She began the most recent cycle of Ibrance 10/25/2015. Last Faslodex injection 10/01/2015. She overall feels well. She denies nausea/vomiting. No mouth sores. No diarrhea. No rash. She has a good appetite. Pain is some better. Specifically right shoulder and back. She continues hydrocodone as needed.  Objective:  Vital signs in last 24 hours:  Blood pressure 121/57, pulse 61, temperature 98.4 F (36.9 C), temperature source Oral, resp. rate 16, weight 164 lb 1 oz (74.418 kg), last menstrual period 10/08/2014, SpO2 100 %.    HEENT: No thrush or ulcers. Lymphatics: Small less than 1 cm mobile left axillary lymph node. No cervical, supra clavicular, right axillary lymph nodes. Resp: Lungs clear bilaterally. Cardio: Regular rate and rhythm. GI: Abdomen soft and nontender. No hepatomegaly. Vascular: No leg edema. Calves soft and nontender.   Lab Results:  Lab Results  Component Value Date   WBC 2.6* 10/29/2015   HGB 11.9 10/29/2015   HCT 35.9 10/29/2015   MCV 99.4 10/29/2015   PLT 275 10/29/2015   NEUTROABS 1.8 10/29/2015    Imaging:  No results found.  Medications: I have reviewed the patient's current medications.  Assessment/Plan: 1. Multicentric invasive lobular carcinoma of the right breast diagnosed in December of 1999, a right mastectomy followed by adjuvant [facility] chemotherapy, 5 years of tamoxifen, and 5 years of Femara. The Femara was completed in June of 2010. The right breast mass excision in January 2000 was ER positive, PR positive, and HER-2 negative  2. Pain at the left groin area with a CT of the pelvis on 02/07/12 confirming a destructive lytic lesion at the left pubic symphysis, status post a CT-guided biopsy on 02/16/12 confirming metastatic carcinoma, focally ER positive .  -PET scan 03/07/2012  confirmed multiple hypermetabolic bone metastases with no other evidence of metastatic disease  -Status post palliative radiation to the pelvis beginning on 03/12/2012  -Initiation of Arimidex on 03/27/2012 -initiation of every three-month xgeva on 04/04/2012  -PET scan September 15 [id number] new hypermetabolic bone lesions  -Initiation of Faslodex 09/27/2012  -Restaging PET scan 04/08/2013 with evidence of disease progression in the bones  -Initiation of Aromasin/afinitor 05/29/2013  -Iliac biopsy at [facility] 05/22/2013 confirming HER-2 negative metastatic breast  -PET scan 11/18/2013 with a decrease in hypermetabolic activity  -PET scan 10/08/2014 with progressive diffuse hypermetabolic bone metastases -Initiation of Palbociclib 10/20/2014; Faslodex 10/23/2014. -Palbociclib placed on hold beginning 11/07/2014 due to neutropenia. -Palbociclib resumed 11/27/2014 on an every other day schedule -Palbociclib resumed 01/04/2015 at a reduced dose of 100 mg daily for 21 days. -Palbociclib beginning 02/12/2015 at a dose of 100 mg daily for 14 days on/14 days off. -PET scan 05/18/2015 with diffuse hypermetabolic bone lesions, increased metabolic activity compared to the PET scan from January 2016 -Palbociclib resumed 06/07/2015, Faslodex continued 06/12/2015 -PET scan 09/22/2015 with diffuse hypermetabolic bone lesions, increased metabolic activity compared to the PET scan 05/18/2015 -Continuation of palbociclib and Faslodex 3. BRCA1 variant felt to be a polymorphism  4. Pain/tenderness at the left low anterolateral chest wall-she completed palliative radiation on 05/28/2012 with improvement in the pain. She was also treated with radiation to the thoracic spine.  5. Anemia/leukopenia-likely secondary to breast cancer involving the bone marrow and radiation /afinitor -the anemia is improved  6. History of mildly elevated liver enzymes-? Related to afinitor,? Secondary to metastatic breast  cancer 7. Neutropenia  secondary to Palbociclib. Improved. 8. Pain at the left shoulder and mid back-a plain x-ray of the left shoulder 06/12/2015 revealed a sclerotic density at the left scapula adjacent to the glenoid, there is metastatic disease involving the humeral head and left scapula on the PET scan 05/18/2015. She completed a course of radiation to the left shoulder on 08/05/2015. 9. Mild hypercalcemia-asymptomatic   Disposition: Stacy Horn appears stable. Plan to continue Ibrance and Faslodex as currently taking. She will receive a Faslodex injection today. She will continue Xgeva every 3 months.  We scheduled a follow-up visit in one month. She will contact the office in the interim with any problems. She was given a new hydrocodone prescription at today's visit.  Patient seen with Dr. Benay Spice.  Ned Card ANP/GNP-BC   10/29/2015  10:55 AM  This was a shared visit with Ned Card. Stacy Horn appears stable. The plan is to continue the current therapy. I discussed the most recent PET scan with the reading radiologist. There is no clear evidence of disease progression though some lesions have a higher SUV on the current study. It is difficult to compare SUV values between studies. We decided to continue the current treatment in the absence of clear disease progression.  Julieanne Manson, M.D.

## 2015-11-06 ENCOUNTER — Other Ambulatory Visit: Payer: Self-pay | Admitting: Nurse Practitioner

## 2015-11-06 DIAGNOSIS — C7951 Secondary malignant neoplasm of bone: Secondary | ICD-10-CM

## 2015-11-06 DIAGNOSIS — C50919 Malignant neoplasm of unspecified site of unspecified female breast: Secondary | ICD-10-CM

## 2015-11-06 MED ORDER — PALBOCICLIB 100 MG PO CAPS: 100.0000 mg | ORAL_CAPSULE | Freq: Every day | ORAL | Status: DC

## 2015-11-26 ENCOUNTER — Ambulatory Visit (HOSPITAL_BASED_OUTPATIENT_CLINIC_OR_DEPARTMENT_OTHER): Payer: 59 | Admitting: Oncology

## 2015-11-26 ENCOUNTER — Ambulatory Visit (HOSPITAL_BASED_OUTPATIENT_CLINIC_OR_DEPARTMENT_OTHER): Payer: Medicare Other

## 2015-11-26 ENCOUNTER — Other Ambulatory Visit (HOSPITAL_BASED_OUTPATIENT_CLINIC_OR_DEPARTMENT_OTHER): Payer: Medicare Other

## 2015-11-26 VITALS — BP 118/69 | HR 70 | Temp 98.5°F | Resp 20 | Wt 163.2 lb

## 2015-11-26 DIAGNOSIS — J069 Acute upper respiratory infection, unspecified: Secondary | ICD-10-CM

## 2015-11-26 DIAGNOSIS — C50911 Malignant neoplasm of unspecified site of right female breast: Secondary | ICD-10-CM

## 2015-11-26 DIAGNOSIS — D701 Agranulocytosis secondary to cancer chemotherapy: Secondary | ICD-10-CM | POA: Diagnosis not present

## 2015-11-26 DIAGNOSIS — C7951 Secondary malignant neoplasm of bone: Secondary | ICD-10-CM | POA: Diagnosis not present

## 2015-11-26 DIAGNOSIS — Z5111 Encounter for antineoplastic chemotherapy: Secondary | ICD-10-CM | POA: Diagnosis present

## 2015-11-26 DIAGNOSIS — C50919 Malignant neoplasm of unspecified site of unspecified female breast: Secondary | ICD-10-CM

## 2015-11-26 DIAGNOSIS — Z9889 Other specified postprocedural states: Secondary | ICD-10-CM

## 2015-11-26 LAB — CBC WITH DIFFERENTIAL/PLATELET
BASO%: 0.5 % (ref 0.0–2.0)
Basophils Absolute: 0 10*3/uL (ref 0.0–0.1)
EOS%: 0.6 % (ref 0.0–7.0)
Eosinophils Absolute: 0 10*3/uL (ref 0.0–0.5)
HCT: 37 % (ref 34.8–46.6)
HGB: 12.4 g/dL (ref 11.6–15.9)
LYMPH%: 28.7 % (ref 14.0–49.7)
MCH: 33.2 pg (ref 25.1–34.0)
MCHC: 33.6 g/dL (ref 31.5–36.0)
MCV: 98.7 fL (ref 79.5–101.0)
MONO#: 0.2 10*3/uL (ref 0.1–0.9)
MONO%: 7.3 % (ref 0.0–14.0)
NEUT#: 1.9 10*3/uL (ref 1.5–6.5)
NEUT%: 62.9 % (ref 38.4–76.8)
PLATELETS: 271 10*3/uL (ref 145–400)
RBC: 3.75 10*6/uL (ref 3.70–5.45)
RDW: 15.4 % — ABNORMAL HIGH (ref 11.2–14.5)
WBC: 2.9 10*3/uL — AB (ref 3.9–10.3)
lymph#: 0.8 10*3/uL — ABNORMAL LOW (ref 0.9–3.3)

## 2015-11-26 LAB — COMPREHENSIVE METABOLIC PANEL
ALBUMIN: 3.9 g/dL (ref 3.5–5.0)
ALK PHOS: 82 U/L (ref 40–150)
ALT: 34 U/L (ref 0–55)
AST: 43 U/L — AB (ref 5–34)
Anion Gap: 9 mEq/L (ref 3–11)
BILIRUBIN TOTAL: 0.51 mg/dL (ref 0.20–1.20)
BUN: 17.1 mg/dL (ref 7.0–26.0)
CALCIUM: 11.3 mg/dL — AB (ref 8.4–10.4)
CO2: 25 mEq/L (ref 22–29)
Chloride: 105 mEq/L (ref 98–109)
Creatinine: 0.9 mg/dL (ref 0.6–1.1)
EGFR: 65 mL/min/{1.73_m2} — AB (ref >90)
Glucose: 89 mg/dl (ref 70–140)
POTASSIUM: 4.1 meq/L (ref 3.5–5.1)
Sodium: 140 mEq/L (ref 136–145)
Total Protein: 7.9 g/dL (ref 6.4–8.3)

## 2015-11-26 MED ORDER — SODIUM CHLORIDE 0.9 % IV SOLN: Freq: Once | INTRAVENOUS | Status: DC | PRN

## 2015-11-26 MED ORDER — FULVESTRANT 250 MG/5ML IM SOLN
500.0000 mg | Freq: Once | INTRAMUSCULAR | Status: AC
  Administered 2015-11-26: 500 mg via INTRAMUSCULAR
  Filled 2015-11-26: qty 10

## 2015-11-26 MED ORDER — EPINEPHRINE HCL 0.1 MG/ML IJ SOLN: 0.2500 mg | Freq: Once | INTRAMUSCULAR | Status: DC | PRN

## 2015-11-26 MED ORDER — DENOSUMAB 120 MG/1.7ML ~~LOC~~ SOLN
120.0000 mg | Freq: Once | SUBCUTANEOUS | Status: AC
  Administered 2015-11-26: 120 mg via SUBCUTANEOUS
  Filled 2015-11-26: qty 1.7

## 2015-11-26 MED ORDER — FULVESTRANT 250 MG/5ML IM SOLN
500.0000 mg | Freq: Once | INTRAMUSCULAR | Status: DC
  Filled 2015-11-26: qty 10

## 2015-11-26 MED ORDER — DIPHENHYDRAMINE HCL 50 MG/ML IJ SOLN: 50.0000 mg | Freq: Once | INTRAMUSCULAR | Status: DC | PRN

## 2015-11-26 MED ORDER — HYDROCODONE-ACETAMINOPHEN 7.5-325 MG PO TABS: 1.0000 | ORAL_TABLET | ORAL | Status: DC | PRN

## 2015-11-26 MED ORDER — ALBUTEROL SULFATE (2.5 MG/3ML) 0.083% IN NEBU
2.5000 mg | INHALATION_SOLUTION | Freq: Once | RESPIRATORY_TRACT | Status: DC | PRN
  Filled 2015-11-26: qty 3

## 2015-11-26 MED ORDER — DIPHENHYDRAMINE HCL 50 MG/ML IJ SOLN: 25.0000 mg | Freq: Once | INTRAMUSCULAR | Status: DC | PRN

## 2015-11-26 MED ORDER — METHYLPREDNISOLONE SODIUM SUCC 125 MG IJ SOLR: 125.0000 mg | Freq: Once | INTRAMUSCULAR | Status: DC | PRN

## 2015-11-26 NOTE — Patient Instructions (Signed)
Denosumab injection What is this medicine? DENOSUMAB (den oh sue mab) slows bone breakdown. Prolia is used to treat osteoporosis in women after menopause and in men. Xgeva is used to prevent bone fractures and other bone problems caused by cancer bone metastases. Xgeva is also used to treat giant cell tumor of the bone. This medicine may be used for other purposes; ask your health care provider or pharmacist if you have questions. What should I tell my health care provider before I take this medicine? They need to know if you have any of these conditions: -dental disease -eczema -infection or history of infections -kidney disease or on dialysis -low blood calcium or vitamin D -malabsorption syndrome -scheduled to have surgery or tooth extraction -taking medicine that contains denosumab -thyroid or parathyroid disease -an unusual reaction to denosumab, other medicines, foods, dyes, or preservatives -pregnant or trying to get pregnant -breast-feeding How should I use this medicine? This medicine is for injection under the skin. It is given by a health care professional in a hospital or clinic setting. If you are getting Prolia, a special MedGuide will be given to you by the pharmacist with each prescription and refill. Be sure to read this information carefully each time. For Prolia, talk to your pediatrician regarding the use of this medicine in children. Special care may be needed. For Xgeva, talk to your pediatrician regarding the use of this medicine in children. While this drug may be prescribed for children as young as 13 years for selected conditions, precautions do apply. Overdosage: If you think you have taken too much of this medicine contact a poison control center or emergency room at once. NOTE: This medicine is only for you. Do not share this medicine with others. What if I miss a dose? It is important not to miss your dose. Call your doctor or health care professional if you are  unable to keep an appointment. What may interact with this medicine? Do not take this medicine with any of the following medications: -other medicines containing denosumab This medicine may also interact with the following medications: -medicines that suppress the immune system -medicines that treat cancer -steroid medicines like prednisone or cortisone This list may not describe all possible interactions. Give your health care provider a list of all the medicines, herbs, non-prescription drugs, or dietary supplements you use. Also tell them if you smoke, drink alcohol, or use illegal drugs. Some items may interact with your medicine. What should I watch for while using this medicine? Visit your doctor or health care professional for regular checks on your progress. Your doctor or health care professional may order blood tests and other tests to see how you are doing. Call your doctor or health care professional if you get a cold or other infection while receiving this medicine. Do not treat yourself. This medicine may decrease your body's ability to fight infection. You should make sure you get enough calcium and vitamin D while you are taking this medicine, unless your doctor tells you not to. Discuss the foods you eat and the vitamins you take with your health care professional. See your dentist regularly. Brush and floss your teeth as directed. Before you have any dental work done, tell your dentist you are receiving this medicine. Do not become pregnant while taking this medicine or for 5 months after stopping it. Women should inform their doctor if they wish to become pregnant or think they might be pregnant. There is a potential for serious side effects   to an unborn child. Talk to your health care professional or pharmacist for more information. What side effects may I notice from receiving this medicine? Side effects that you should report to your doctor or health care professional as soon as  possible: -allergic reactions like skin rash, itching or hives, swelling of the face, lips, or tongue -breathing problems -chest pain -fast, irregular heartbeat -feeling faint or lightheaded, falls -fever, chills, or any other sign of infection -muscle spasms, tightening, or twitches -numbness or tingling -skin blisters or bumps, or is dry, peels, or red -slow healing or unexplained pain in the mouth or jaw -unusual bleeding or bruising Side effects that usually do not require medical attention (Report these to your doctor or health care professional if they continue or are bothersome.): -muscle pain -stomach upset, gas This list may not describe all possible side effects. Call your doctor for medical advice about side effects. You may report side effects to FDA at 1-800-FDA-1088. Where should I keep my medicine? This medicine is only given in a clinic, doctor's office, or other health care setting and will not be stored at home. NOTE: This sheet is a summary. It may not cover all possible information. If you have questions about this medicine, talk to your doctor, pharmacist, or health care provider.    2016, Elsevier/Gold Standard. (2012-03-05 12:37:47) Fulvestrant injection What is this medicine? FULVESTRANT (ful VES trant) blocks the effects of estrogen. It is used to treat breast cancer. This medicine may be used for other purposes; ask your health care provider or pharmacist if you have questions. What should I tell my health care provider before I take this medicine? They need to know if you have any of these conditions: -bleeding problems -liver disease -low levels of platelets in the blood -an unusual or allergic reaction to fulvestrant, other medicines, foods, dyes, or preservatives -pregnant or trying to get pregnant -breast-feeding How should I use this medicine? This medicine is for injection into a muscle. It is usually given by a health care professional in a  hospital or clinic setting. Talk to your pediatrician regarding the use of this medicine in children. Special care may be needed. Overdosage: If you think you have taken too much of this medicine contact a poison control center or emergency room at once. NOTE: This medicine is only for you. Do not share this medicine with others. What if I miss a dose? It is important not to miss your dose. Call your doctor or health care professional if you are unable to keep an appointment. What may interact with this medicine? -medicines that treat or prevent blood clots like warfarin, enoxaparin, and dalteparin This list may not describe all possible interactions. Give your health care provider a list of all the medicines, herbs, non-prescription drugs, or dietary supplements you use. Also tell them if you smoke, drink alcohol, or use illegal drugs. Some items may interact with your medicine. What should I watch for while using this medicine? Your condition will be monitored carefully while you are receiving this medicine. You will need important blood work done while you are taking this medicine. Do not become pregnant while taking this medicine or for at least 1 year after stopping it. Women of child-bearing potential will need to have a negative pregnancy test before starting this medicine. Women should inform their doctor if they wish to become pregnant or think they might be pregnant. There is a potential for serious side effects to an unborn child. Men   should inform their doctors if they wish to father a child. This medicine may lower sperm counts. Talk to your health care professional or pharmacist for more information. Do not breast-feed an infant while taking this medicine or for 1 year after the last dose. What side effects may I notice from receiving this medicine? Side effects that you should report to your doctor or health care professional as soon as possible: -allergic reactions like skin rash,  itching or hives, swelling of the face, lips, or tongue -feeling faint or lightheaded, falls -pain, tingling, numbness, or weakness in the legs -signs and symptoms of infection like fever or chills; cough; flu-like symptoms; sore throat -vaginal bleeding Side effects that usually do not require medical attention (report to your doctor or health care professional if they continue or are bothersome): -aches, pains -constipation -diarrhea -headache -hot flashes -nausea, vomiting -pain at site where injected -stomach pain This list may not describe all possible side effects. Call your doctor for medical advice about side effects. You may report side effects to FDA at 1-800-FDA-1088. Where should I keep my medicine? This drug is given in a hospital or clinic and will not be stored at home. NOTE: This sheet is a summary. It may not cover all possible information. If you have questions about this medicine, talk to your doctor, pharmacist, or health care provider.    2016, Elsevier/Gold Standard. (2015-04-03 11:03:55)  

## 2015-11-26 NOTE — Progress Notes (Signed)
[facility] OFFICE PROGRESS NOTE   Diagnosis: Breast cancer  INTERVAL HISTORY:   Stacy Horn returns as scheduled. She began another cycle of Ibrance on 11/22/2015. She currently has an upper respiratory infection with sinus congestion and rhinorrhea. She had a maximum temperature of 99.6. No cough. Her pain is improved. No new sites of pain. She did not take calcium today.  Objective:  Vital signs in last 24 hours:  Blood pressure 118/69, pulse 70, temperature 98.5 F (36.9 C), temperature source Oral, resp. rate 20, weight 163 lb 3.2 oz (74.027 kg), last menstrual period 10/08/2014, SpO2 100 %.    HEENT: Pharynx without erythema or exudate Resp: Lungs clear bilaterally Cardio: Regular rate and rhythm GI: No hepatomegaly Vascular: No leg edema   Lab Results:  Lab Results  Component Value Date   WBC 2.9* 11/26/2015   HGB 12.4 11/26/2015   HCT 37.0 11/26/2015   MCV 98.7 11/26/2015   PLT 271 11/26/2015   NEUTROABS 1.9 11/26/2015   Calcium 11.3, creatinine 0.9, albumin 3.9  Medications: I have reviewed the patient's current medications.  Assessment/Plan: 1. Multicentric invasive lobular carcinoma of the right breast diagnosed in December of 1999, a right mastectomy followed by adjuvant [facility] chemotherapy, 5 years of tamoxifen, and 5 years of Femara. The Femara was completed in June of 2010. The right breast mass excision in January 2000 was ER positive, PR positive, and HER-2 negative  2. Pain at the left groin area with a CT of the pelvis on 02/07/12 confirming a destructive lytic lesion at the left pubic symphysis, status post a CT-guided biopsy on 02/16/12 confirming metastatic carcinoma, focally ER positive .  -PET scan 03/07/2012 confirmed multiple hypermetabolic bone metastases with no other evidence of metastatic disease  -Status post palliative radiation to the pelvis beginning on 03/12/2012  -Initiation of Arimidex on 03/27/2012 -initiation of every  three-month xgeva on 04/04/2012  -PET scan September 15 [id number] new hypermetabolic bone lesions  -Initiation of Faslodex 09/27/2012  -Restaging PET scan 04/08/2013 with evidence of disease progression in the bones  -Initiation of Aromasin/afinitor 05/29/2013  -Iliac biopsy at [facility] 05/22/2013 confirming HER-2 negative metastatic breast  -PET scan 11/18/2013 with a decrease in hypermetabolic activity  -PET scan 10/08/2014 with progressive diffuse hypermetabolic bone metastases -Initiation of Palbociclib 10/20/2014; Faslodex 10/23/2014. -Palbociclib placed on hold beginning 11/07/2014 due to neutropenia. -Palbociclib resumed 11/27/2014 on an every other day schedule -Palbociclib resumed 01/04/2015 at a reduced dose of 100 mg daily for 21 days. -Palbociclib beginning 02/12/2015 at a dose of 100 mg daily for 14 days on/14 days off. -PET scan 05/18/2015 with diffuse hypermetabolic bone lesions, increased metabolic activity compared to the PET scan from January 2016 -Palbociclib resumed 06/07/2015, Faslodex continued 06/12/2015 -PET scan 09/22/2015 with diffuse hypermetabolic bone lesions, increased metabolic activity compared to the PET scan 05/18/2015 -Continuation of palbociclib and Faslodex 3. BRCA1 variant felt to be a polymorphism  4. Pain/tenderness at the left low anterolateral chest wall-she completed palliative radiation on 05/28/2012 with improvement in the pain. She was also treated with radiation to the thoracic spine.  5. Anemia/leukopenia-likely secondary to breast cancer involving the bone marrow and radiation /afinitor -the anemia is improved  6. History of mildly elevated liver enzymes-? Related to afinitor,? Secondary to metastatic breast cancer 7. Neutropenia secondary to Palbociclib. Improved. 8. Pain at the left shoulder and mid back-a plain x-ray of the left shoulder 06/12/2015 revealed a sclerotic density at the left scapula adjacent to the glenoid, there is  metastatic disease involving the humeral head and left scapula on the PET scan 05/18/2015. She completed a course of radiation to the left shoulder on 08/05/2015. 9. Mild hypercalcemia-asymptomatic    Disposition:  She appears to have a viral upper respiratory infection. She otherwise appears stable. The calcium is elevated. She appears to have hypercalcemia of malignancy. She will receive xgeva today. I reviewed symptoms of hypercalcemia with Ms. Navejas and her husband. They know to contact us if she develops symptoms. She will return for an office and lab visit on 12/24/2015.  [name], MD  11/26/2015  10:42 AM

## 2015-11-27 ENCOUNTER — Other Ambulatory Visit: Payer: Self-pay | Admitting: *Deleted

## 2015-11-27 DIAGNOSIS — C7951 Secondary malignant neoplasm of bone: Secondary | ICD-10-CM

## 2015-11-27 DIAGNOSIS — C50919 Malignant neoplasm of unspecified site of unspecified female breast: Secondary | ICD-10-CM

## 2015-11-27 MED ORDER — PALBOCICLIB 100 MG PO CAPS: 100.0000 mg | ORAL_CAPSULE | Freq: Every day | ORAL | Status: DC

## 2015-12-24 ENCOUNTER — Ambulatory Visit: Payer: Medicare Other

## 2015-12-24 ENCOUNTER — Telehealth: Payer: Self-pay | Admitting: Oncology

## 2015-12-24 ENCOUNTER — Ambulatory Visit (HOSPITAL_BASED_OUTPATIENT_CLINIC_OR_DEPARTMENT_OTHER): Payer: Medicare Other

## 2015-12-24 ENCOUNTER — Other Ambulatory Visit (HOSPITAL_BASED_OUTPATIENT_CLINIC_OR_DEPARTMENT_OTHER): Payer: Medicare Other

## 2015-12-24 ENCOUNTER — Ambulatory Visit (HOSPITAL_BASED_OUTPATIENT_CLINIC_OR_DEPARTMENT_OTHER): Payer: Medicare Other | Admitting: Nurse Practitioner

## 2015-12-24 VITALS — BP 114/48 | HR 76 | Temp 98.0°F | Resp 20 | Ht 65.0 in | Wt 165.4 lb

## 2015-12-24 DIAGNOSIS — C7951 Secondary malignant neoplasm of bone: Secondary | ICD-10-CM

## 2015-12-24 DIAGNOSIS — C50911 Malignant neoplasm of unspecified site of right female breast: Secondary | ICD-10-CM

## 2015-12-24 DIAGNOSIS — Z5111 Encounter for antineoplastic chemotherapy: Secondary | ICD-10-CM

## 2015-12-24 DIAGNOSIS — C50919 Malignant neoplasm of unspecified site of unspecified female breast: Secondary | ICD-10-CM

## 2015-12-24 DIAGNOSIS — Z9889 Other specified postprocedural states: Secondary | ICD-10-CM

## 2015-12-24 LAB — CBC WITH DIFFERENTIAL/PLATELET
BASO%: 0.8 % (ref 0.0–2.0)
Basophils Absolute: 0 10*3/uL (ref 0.0–0.1)
EOS%: 0.9 % (ref 0.0–7.0)
Eosinophils Absolute: 0 10*3/uL (ref 0.0–0.5)
HCT: 37.2 % (ref 34.8–46.6)
HEMOGLOBIN: 12.5 g/dL (ref 11.6–15.9)
LYMPH#: 0.9 10*3/uL (ref 0.9–3.3)
LYMPH%: 28.4 % (ref 14.0–49.7)
MCH: 33.5 pg (ref 25.1–34.0)
MCHC: 33.6 g/dL (ref 31.5–36.0)
MCV: 99.7 fL (ref 79.5–101.0)
MONO#: 0.2 10*3/uL (ref 0.1–0.9)
MONO%: 7 % (ref 0.0–14.0)
NEUT%: 62.9 % (ref 38.4–76.8)
NEUTROS ABS: 1.9 10*3/uL (ref 1.5–6.5)
Platelets: 273 10*3/uL (ref 145–400)
RBC: 3.73 10*6/uL (ref 3.70–5.45)
RDW: 15.8 % — AB (ref 11.2–14.5)
WBC: 3 10*3/uL — AB (ref 3.9–10.3)

## 2015-12-24 LAB — COMPREHENSIVE METABOLIC PANEL
ALBUMIN: 4 g/dL (ref 3.5–5.0)
ALK PHOS: 81 U/L (ref 40–150)
ALT: 33 U/L (ref 0–55)
AST: 42 U/L — AB (ref 5–34)
Anion Gap: 8 mEq/L (ref 3–11)
BILIRUBIN TOTAL: 0.41 mg/dL (ref 0.20–1.20)
BUN: 23.7 mg/dL (ref 7.0–26.0)
CO2: 28 mEq/L (ref 22–29)
CREATININE: 1 mg/dL (ref 0.6–1.1)
Calcium: 11.8 mg/dL — ABNORMAL HIGH (ref 8.4–10.4)
Chloride: 107 mEq/L (ref 98–109)
EGFR: 62 mL/min/{1.73_m2} — ABNORMAL LOW (ref >90)
GLUCOSE: 104 mg/dL (ref 70–140)
Potassium: 4.5 mEq/L (ref 3.5–5.1)
SODIUM: 142 meq/L (ref 136–145)
TOTAL PROTEIN: 8 g/dL (ref 6.4–8.3)

## 2015-12-24 MED ORDER — FULVESTRANT 250 MG/5ML IM SOLN
500.0000 mg | Freq: Once | INTRAMUSCULAR | Status: AC
  Administered 2015-12-24: 500 mg via INTRAMUSCULAR
  Filled 2015-12-24: qty 10

## 2015-12-24 MED ORDER — ZOLEDRONIC ACID 4 MG/100ML IV SOLN
4.0000 mg | Freq: Once | INTRAVENOUS | Status: AC
  Administered 2015-12-24: 4 mg via INTRAVENOUS
  Filled 2015-12-24: qty 100

## 2015-12-24 MED ORDER — SODIUM CHLORIDE 0.9 % IV SOLN
Freq: Once | INTRAVENOUS | Status: AC
  Administered 2015-12-24: 12:00:00 via INTRAVENOUS

## 2015-12-24 MED ORDER — HYDROCODONE-ACETAMINOPHEN 7.5-325 MG PO TABS: 1.0000 | ORAL_TABLET | ORAL | Status: DC | PRN

## 2015-12-24 NOTE — Patient Instructions (Signed)

## 2015-12-24 NOTE — Progress Notes (Addendum)
[place] OFFICE PROGRESS NOTE   Diagnosis:  Breast cancer  INTERVAL HISTORY:   Stacy Horn returns as scheduled. She began another cycle of Ibrance 12/20/2015. She denies nausea/vomiting. No mouth sores. No diarrhea. No rash. Last week she noted lower neck/upper back pain radiating across her back. Her arms feel weak. No known injury. No unusual activities.  Objective:  Vital signs in last 24 hours:  Blood pressure 114/48, pulse 76, temperature 98 F (36.7 C), temperature source Oral, resp. rate 20, height '5\' 5"'  (1.651 m), weight 165 lb 6.4 oz (75.025 kg), last menstrual period 10/08/2014, SpO2 99 %.    HEENT: No thrush or ulcers. Lymphatics: Question tiny bilateral axillary lymph nodes. Resp: Lungs clear bilaterally. Cardio: Regular rate and rhythm. GI: Abdomen soft and nontender. No hepatomegaly. Vascular: No leg edema. Neuro: Alert and oriented. Follows commands. Upper extremity motor strength 5 over 5.  Musculoskeletal: Tender over the mid upper back.   Lab Results:  Lab Results  Component Value Date   WBC 3.0* 12/24/2015   HGB 12.5 12/24/2015   HCT 37.2 12/24/2015   MCV 99.7 12/24/2015   PLT 273 12/24/2015   NEUTROABS 1.9 12/24/2015    Imaging:  No results found.  Medications: I have reviewed the patient's current medications.  Assessment/Plan: . Multicentric invasive lobular carcinoma of the right breast diagnosed in December of 1999, a right mastectomy followed by adjuvant [facility] chemotherapy, 5 years of tamoxifen, and 5 years of Femara. The Femara was completed in June of 2010. The right breast mass excision in January 2000 was ER positive, PR positive, and HER-2 negative  2. Pain at the left groin area with a CT of the pelvis on 02/07/12 confirming a destructive lytic lesion at the left pubic symphysis, status post a CT-guided biopsy on 02/16/12 confirming metastatic carcinoma, focally ER positive .  -PET scan 03/07/2012 confirmed multiple  hypermetabolic bone metastases with no other evidence of metastatic disease  -Status post palliative radiation to the pelvis beginning on 03/12/2012  -Initiation of Arimidex on 03/27/2012 -initiation of every three-month xgeva on 04/04/2012  -PET scan September 14 [id number] new hypermetabolic bone lesions  -Initiation of Faslodex 09/27/2012  -Restaging PET scan 04/08/2013 with evidence of disease progression in the bones  -Initiation of Aromasin/afinitor 05/29/2013  -Iliac biopsy at [facility] 05/22/2013 confirming HER-2 negative metastatic breast  -PET scan 11/18/2013 with a decrease in hypermetabolic activity  -PET scan 10/08/2014 with progressive diffuse hypermetabolic bone metastases -Initiation of Palbociclib 10/20/2014; Faslodex 10/23/2014. -Palbociclib placed on hold beginning 11/07/2014 due to neutropenia. -Palbociclib resumed 11/27/2014 on an every other day schedule -Palbociclib resumed 01/04/2015 at a reduced dose of 100 mg daily for 21 days. -Palbociclib beginning 02/12/2015 at a dose of 100 mg daily for 14 days on/14 days off. -PET scan 05/18/2015 with diffuse hypermetabolic bone lesions, increased metabolic activity compared to the PET scan from January 2016 -Palbociclib resumed 06/07/2015, Faslodex continued 06/12/2015 -PET scan 09/22/2015 with diffuse hypermetabolic bone lesions, increased metabolic activity compared to the PET scan 05/18/2015 -Continuation of palbociclib and Faslodex 3. BRCA1 variant felt to be a polymorphism  4. Pain/tenderness at the left low anterolateral chest wall-she completed palliative radiation on 05/28/2012 with improvement in the pain. She was also treated with radiation to the thoracic spine.  5. Anemia/leukopenia-likely secondary to breast cancer involving the bone marrow and radiation /afinitor -the anemia is improved  6. History of mildly elevated liver enzymes-? Related to afinitor,? Secondary to metastatic breast cancer 7. Neutropenia  secondary  to Palbociclib. Improved. 8. Pain at the left shoulder and mid back-a plain x-ray of the left shoulder 06/12/2015 revealed a sclerotic density at the left scapula adjacent to the glenoid, there is metastatic disease involving the humeral head and left scapula on the PET scan 05/18/2015. She completed a course of radiation to the left shoulder on 08/05/2015. 9. Mild hypercalcemia-asymptomatic; Delton See 11/26/2015; persistent/progressive hypercalcemia 12/24/2015. She will receive Zometa today.   Disposition: Ms. Harmes has persistent hypercalcemia and recent onset of low neck/upper back pain. She understands we are concerned the hypercalcemia and pain indicate progression of the breast cancer. We are referring her for a stat MRI of the cervical spine. Due to other plans she has today the earliest she can do the MRI is tomorrow. She understands to contact the [facility] emergency evaluation if symptoms worsen as this could indicate compression of the spinal cord. She expressed understanding.  For the hypercalcemia she will receive Zometa 4 mg intravenously in the office today.  We discussed discontinuing Ibrance and Faslodex. She would like to continue both until we have the MRI result.  We scheduled a return visit on 01/04/2016. Appointment will be adjusted accordingly pending the MRI result.  Patient seen with Dr. Benay Spice. 25 minutes were spent face-to-face at today's visit with the majority of that time involved in counseling/coordination of care.   Ned Card ANP/GNP-BC   12/24/2015  11:01 AM  This was a shared visit with Ned Card. Ms. Paglia was interviewed and examined.  We are concerned the metastatic breast cancer is progressing as evidenced by her increased pain and hypercalcemia. She will receive Zometa today. We will refer her for an MRI of the cervical spine. We will discuss changing systemic treatment based on the MRI finding.  Julieanne Manson, M.D.

## 2015-12-24 NOTE — Telephone Encounter (Signed)
Gave and printed appt sched and avs for pt for April °

## 2015-12-24 NOTE — Progress Notes (Signed)
X-geva injection given by infusion nurse after treatment 

## 2015-12-25 ENCOUNTER — Telehealth: Payer: Self-pay | Admitting: Nurse Practitioner

## 2015-12-25 ENCOUNTER — Ambulatory Visit (HOSPITAL_COMMUNITY)
Admission: RE | Admit: 2015-12-25 | Discharge: 2015-12-25 | Disposition: A | Discharge status: Home / Self Care | Payer: Medicare Other | Source: Ambulatory Visit | Attending: Nurse Practitioner | Admitting: Nurse Practitioner

## 2015-12-25 ENCOUNTER — Other Ambulatory Visit: Payer: Self-pay | Admitting: Nurse Practitioner

## 2015-12-25 DIAGNOSIS — M50222 Other cervical disc displacement at C5-C6 level: Secondary | ICD-10-CM | POA: Diagnosis not present

## 2015-12-25 DIAGNOSIS — M50321 Other cervical disc degeneration at C4-C5 level: Secondary | ICD-10-CM | POA: Diagnosis not present

## 2015-12-25 DIAGNOSIS — M50221 Other cervical disc displacement at C4-C5 level: Secondary | ICD-10-CM | POA: Insufficient documentation

## 2015-12-25 DIAGNOSIS — M50322 Other cervical disc degeneration at C5-C6 level: Secondary | ICD-10-CM | POA: Diagnosis not present

## 2015-12-25 DIAGNOSIS — C7951 Secondary malignant neoplasm of bone: Principal | ICD-10-CM

## 2015-12-25 DIAGNOSIS — C50919 Malignant neoplasm of unspecified site of unspecified female breast: Secondary | ICD-10-CM | POA: Diagnosis not present

## 2015-12-25 DIAGNOSIS — X58XXXA Exposure to other specified factors, initial encounter: Secondary | ICD-10-CM | POA: Diagnosis not present

## 2015-12-25 DIAGNOSIS — S12600A Unspecified displaced fracture of seventh cervical vertebra, initial encounter for closed fracture: Secondary | ICD-10-CM | POA: Diagnosis not present

## 2015-12-25 MED ORDER — GADOBENATE DIMEGLUMINE 529 MG/ML IV SOLN
15.0000 mL | Freq: Once | INTRAVENOUS | Status: AC | PRN
  Administered 2015-12-25: 15 mL via INTRAVENOUS

## 2015-12-25 NOTE — Telephone Encounter (Signed)
spoke w/ pt .. confirmed lab appt for 4/17 @10 :15

## 2015-12-25 NOTE — Telephone Encounter (Signed)
Per Dr. Benay Spice I notified Ms. Drummer that the MRI showed an acute-appearing fracture of the C7 spinous process; no epidural tumor was identified.

## 2015-12-28 ENCOUNTER — Encounter: Payer: Self-pay | Admitting: Nurse Practitioner

## 2015-12-29 DIAGNOSIS — H43393 Other vitreous opacities, bilateral: Secondary | ICD-10-CM | POA: Diagnosis not present

## 2015-12-29 DIAGNOSIS — H2513 Age-related nuclear cataract, bilateral: Secondary | ICD-10-CM | POA: Diagnosis not present

## 2015-12-29 DIAGNOSIS — H31002 Unspecified chorioretinal scars, left eye: Secondary | ICD-10-CM | POA: Diagnosis not present

## 2015-12-29 DIAGNOSIS — H524 Presbyopia: Secondary | ICD-10-CM | POA: Diagnosis not present

## 2016-01-04 ENCOUNTER — Telehealth: Payer: Self-pay | Admitting: Oncology

## 2016-01-04 ENCOUNTER — Telehealth: Payer: Self-pay | Admitting: Nurse Practitioner

## 2016-01-04 ENCOUNTER — Other Ambulatory Visit (HOSPITAL_BASED_OUTPATIENT_CLINIC_OR_DEPARTMENT_OTHER): Payer: Medicare Other

## 2016-01-04 ENCOUNTER — Ambulatory Visit (HOSPITAL_BASED_OUTPATIENT_CLINIC_OR_DEPARTMENT_OTHER): Payer: Medicare Other | Admitting: Nurse Practitioner

## 2016-01-04 VITALS — BP 113/59 | HR 66 | Temp 97.8°F | Wt 166.0 lb

## 2016-01-04 DIAGNOSIS — C50911 Malignant neoplasm of unspecified site of right female breast: Secondary | ICD-10-CM

## 2016-01-04 DIAGNOSIS — C7951 Secondary malignant neoplasm of bone: Secondary | ICD-10-CM | POA: Diagnosis not present

## 2016-01-04 DIAGNOSIS — C50919 Malignant neoplasm of unspecified site of unspecified female breast: Secondary | ICD-10-CM

## 2016-01-04 DIAGNOSIS — M549 Dorsalgia, unspecified: Secondary | ICD-10-CM

## 2016-01-04 LAB — BASIC METABOLIC PANEL
ANION GAP: 7 meq/L (ref 3–11)
BUN: 15.2 mg/dL (ref 7.0–26.0)
CHLORIDE: 108 meq/L (ref 98–109)
CO2: 26 meq/L (ref 22–29)
Calcium: 10.7 mg/dL — ABNORMAL HIGH (ref 8.4–10.4)
Creatinine: 0.8 mg/dL (ref 0.6–1.1)
EGFR: 75 mL/min/{1.73_m2} — AB (ref >90)
GLUCOSE: 86 mg/dL (ref 70–140)
POTASSIUM: 4.4 meq/L (ref 3.5–5.1)
Sodium: 141 mEq/L (ref 136–145)

## 2016-01-04 LAB — CBC WITH DIFFERENTIAL/PLATELET
BASO%: 0 % (ref 0.0–2.0)
Basophils Absolute: 0 10*3/uL (ref 0.0–0.1)
EOS%: 1.9 % (ref 0.0–7.0)
Eosinophils Absolute: 0 10*3/uL (ref 0.0–0.5)
HEMATOCRIT: 32.9 % — AB (ref 34.8–46.6)
HEMOGLOBIN: 11.2 g/dL — AB (ref 11.6–15.9)
LYMPH#: 0.6 10*3/uL — AB (ref 0.9–3.3)
LYMPH%: 36.4 % (ref 14.0–49.7)
MCH: 33.7 pg (ref 25.1–34.0)
MCHC: 34 g/dL (ref 31.5–36.0)
MCV: 99.1 fL (ref 79.5–101.0)
MONO#: 0.1 10*3/uL (ref 0.1–0.9)
MONO%: 8.4 % (ref 0.0–14.0)
NEUT%: 53.3 % (ref 38.4–76.8)
NEUTROS ABS: 0.8 10*3/uL — AB (ref 1.5–6.5)
PLATELETS: 235 10*3/uL (ref 145–400)
RBC: 3.32 10*6/uL — ABNORMAL LOW (ref 3.70–5.45)
RDW: 15.2 % — AB (ref 11.2–14.5)
WBC: 1.5 10*3/uL — ABNORMAL LOW (ref 3.9–10.3)

## 2016-01-04 NOTE — Telephone Encounter (Signed)
per pof to sch pt appt-sent Lattie Haw T email to advise on where to sch pt appt-adv pt willc all with appt time & date after reply

## 2016-01-04 NOTE — Telephone Encounter (Signed)
Called pt with appt for Dr. Albertina Parr on 01/26/16 at 11 am arrival time 10:30 am. [facility] will send out new pt packet and questionnaire

## 2016-01-04 NOTE — Progress Notes (Addendum)
[place] OFFICE PROGRESS NOTE   Diagnosis:  Breast cancer  INTERVAL HISTORY:   Ms. Guido returns as scheduled. Pain at the low neck/upper back is "a little better". She has intermittent pain at the mid to low back. The pain increases with activity. She takes hydrocodone every 4 hours when she is active.  Objective:  Vital signs in last 24 hours:  Blood pressure 113/59, pulse 66, temperature 97.8 F (36.6 C), temperature source Oral, weight 166 lb (75.297 kg), last menstrual period 10/08/2014, SpO2 98 %.    HEENT: No thrush or ulcers. Resp: Lungs clear bilaterally. Cardio: Regular rate and rhythm. GI: No hepatomegaly. Vascular: No leg edema. Neuro: Upper extremity strength 5 over 5.  Musculoskeletal: Tender over the mid low neck/upper back.    Lab Results:  Lab Results  Component Value Date   WBC 1.5* 01/04/2016   HGB 11.2* 01/04/2016   HCT 32.9* 01/04/2016   MCV 99.1 01/04/2016   PLT 235 01/04/2016   NEUTROABS 0.8* 01/04/2016    Imaging:  No results found.  Medications: I have reviewed the patient's current medications.  Assessment/Plan: 1. Multicentric invasive lobular carcinoma of the right breast diagnosed in December of 1999, a right mastectomy followed by adjuvant [facility] chemotherapy, 5 years of tamoxifen, and 5 years of Femara. The Femara was completed in June of 2010. The right breast mass excision in January 2000 was ER positive, PR positive, and HER-2 negative  2. Pain at the left groin area with a CT of the pelvis on 02/07/12 confirming a destructive lytic lesion at the left pubic symphysis, status post a CT-guided biopsy on 02/16/12 confirming metastatic carcinoma, focally ER positive .  -PET scan 03/07/2012 confirmed multiple hypermetabolic bone metastases with no other evidence of metastatic disease  -Status post palliative radiation to the pelvis beginning on 03/12/2012  -Initiation of Arimidex on 03/27/2012 -initiation of every  three-month xgeva on 04/04/2012  -PET scan September 14 [id number] new hypermetabolic bone lesions  -Initiation of Faslodex 09/27/2012  -Restaging PET scan 04/08/2013 with evidence of disease progression in the bones  -Initiation of Aromasin/afinitor 05/29/2013  -Iliac biopsy at [facility] 05/22/2013 confirming HER-2 negative metastatic breast  -PET scan 11/18/2013 with a decrease in hypermetabolic activity  -PET scan 10/08/2014 with progressive diffuse hypermetabolic bone metastases -Initiation of Palbociclib 10/20/2014; Faslodex 10/23/2014. -Palbociclib placed on hold beginning 11/07/2014 due to neutropenia. -Palbociclib resumed 11/27/2014 on an every other day schedule -Palbociclib resumed 01/04/2015 at a reduced dose of 100 mg daily for 21 days. -Palbociclib beginning 02/12/2015 at a dose of 100 mg daily for 14 days on/14 days off. -PET scan 05/18/2015 with diffuse hypermetabolic bone lesions, increased metabolic activity compared to the PET scan from January 2016 -Palbociclib resumed 06/07/2015, Faslodex continued 06/12/2015 -PET scan 09/22/2015 with diffuse hypermetabolic bone lesions, increased metabolic activity compared to the PET scan 05/18/2015 -Continuation of palbociclib and Faslodex 3. BRCA1 variant felt to be a polymorphism  4. Pain/tenderness at the left low anterolateral chest wall-she completed palliative radiation on 05/28/2012 with improvement in the pain. She was also treated with radiation to the thoracic spine.  5. Anemia/leukopenia-likely secondary to breast cancer involving the bone marrow and radiation /afinitor -the anemia is improved  6. History of mildly elevated liver enzymes-? Related to afinitor,? Secondary to metastatic breast cancer 7. Neutropenia secondary to Palbociclib. Improved. 8. Pain at the left shoulder and mid back-a plain x-ray of the left shoulder 06/12/2015 revealed a sclerotic density at the left scapula adjacent to the  glenoid, there is  metastatic disease involving the humeral head and left scapula on the PET scan 05/18/2015. She completed a course of radiation to the left shoulder on 08/05/2015. 9. Mild hypercalcemia-asymptomatic; Delton See 11/26/2015; persistent/progressive hypercalcemia 12/24/2015. She received Zometa 12/24/2015. Improved 01/04/2016. 10. Low neck/upper back pain 12/24/2015 with MRI showing a mildly displaced acute-appearing C7 spinous process fracture; widespread osseous metastatic disease; no evidence of epidural tumor or spinal cord compression.   Disposition: Ms. Molchan appears unchanged. She and her husband understand the increased pain, MRI finding of the C7 spinous process fracture and hypercalcemia are concerning for progression of the breast cancer. A referral has been made to Dr. Albertina Parr at [facility] to discuss treatment options.  The hypercalcemia is better following Zometa 12/24/2015.  She is neutropenic on labs today. She understands to call with fever, signs of infection.  She will return for a follow-up visit and labs in 2 weeks. She will contact the office in the interim as outlined above or with any other problems.  Patient seen with Dr. Benay Spice. MRI cervical spine images reviewed with Ms. Thomassen and her husband. 25 minutes were spent face-to-face at today's visit with the majority of that time involved in counseling/coordination of care.    Ned Card ANP/GNP-BC   01/04/2016  2:09 PM  This was a shared visit with Ned Card.  We will try to expedite the appt. With Dr. Albertina Parr.  The hypercalcemia has improved.  Julieanne Manson, MD

## 2016-01-04 NOTE — Progress Notes (Signed)
Contacted Stacy Horn re: Referral [phone] to Dr. Albertina Parr at [facility] made by Ned Card, NP on 12/25/2015. Per Maudie Mercury neither her nor Lexine Baton in HIM had been contacted regarding referral via inbox. Maudie Mercury stated she or Lexine Baton will complete referral.

## 2016-01-04 NOTE — Telephone Encounter (Signed)
Faxed records to Dr. Marquita Palms [phone] release id)

## 2016-01-07 ENCOUNTER — Telehealth: Payer: Self-pay

## 2016-01-07 NOTE — Telephone Encounter (Signed)
Called and left message at [facility]ology Oncology center, need number for Dr. Albertina Parr for Dr. Benay Spice to communicate with her before patients appt on May 9th.

## 2016-01-08 NOTE — Telephone Encounter (Signed)
Received call from [place] at [facility]. Dr. Albertina Parr was out of the office yesterday but provided her cell phone number for Dr. Benay Spice. [phone]. Information given to Dr. Benay Spice.

## 2016-01-13 ENCOUNTER — Telehealth: Payer: Self-pay | Admitting: Oncology

## 2016-01-13 ENCOUNTER — Telehealth: Payer: Self-pay | Admitting: *Deleted

## 2016-01-13 NOTE — Telephone Encounter (Signed)
Message from pt reporting [facility] appt with Dr. Albertina Parr has been moved up to 4/28. Requesting to be scheduled for Lab/MD/Zometa infusion next week. Order sent to schedulers for 5/2 appointments.

## 2016-01-13 NOTE — Telephone Encounter (Signed)
s.w pt and advised on May appt.....pt ok and aware °

## 2016-01-15 DIAGNOSIS — C50919 Malignant neoplasm of unspecified site of unspecified female breast: Secondary | ICD-10-CM | POA: Diagnosis not present

## 2016-01-19 ENCOUNTER — Ambulatory Visit (HOSPITAL_BASED_OUTPATIENT_CLINIC_OR_DEPARTMENT_OTHER): Payer: Medicare Other

## 2016-01-19 ENCOUNTER — Telehealth: Payer: Self-pay | Admitting: Pharmacist

## 2016-01-19 ENCOUNTER — Telehealth: Payer: Self-pay | Admitting: Nurse Practitioner

## 2016-01-19 ENCOUNTER — Other Ambulatory Visit: Payer: Self-pay | Admitting: *Deleted

## 2016-01-19 ENCOUNTER — Ambulatory Visit (HOSPITAL_BASED_OUTPATIENT_CLINIC_OR_DEPARTMENT_OTHER): Payer: 59 | Admitting: Oncology

## 2016-01-19 ENCOUNTER — Other Ambulatory Visit (HOSPITAL_BASED_OUTPATIENT_CLINIC_OR_DEPARTMENT_OTHER): Payer: 59

## 2016-01-19 VITALS — BP 106/56 | HR 63 | Temp 98.0°F | Resp 17 | Ht 65.0 in | Wt 165.8 lb

## 2016-01-19 DIAGNOSIS — C50919 Malignant neoplasm of unspecified site of unspecified female breast: Secondary | ICD-10-CM

## 2016-01-19 DIAGNOSIS — Z9889 Other specified postprocedural states: Secondary | ICD-10-CM

## 2016-01-19 DIAGNOSIS — C7951 Secondary malignant neoplasm of bone: Secondary | ICD-10-CM

## 2016-01-19 DIAGNOSIS — M549 Dorsalgia, unspecified: Secondary | ICD-10-CM | POA: Diagnosis not present

## 2016-01-19 DIAGNOSIS — C50911 Malignant neoplasm of unspecified site of right female breast: Secondary | ICD-10-CM

## 2016-01-19 LAB — CBC WITH DIFFERENTIAL/PLATELET
BASO%: 1.1 % (ref 0.0–2.0)
Basophils Absolute: 0 10*3/uL (ref 0.0–0.1)
EOS%: 0.7 % (ref 0.0–7.0)
Eosinophils Absolute: 0 10*3/uL (ref 0.0–0.5)
HEMATOCRIT: 33.7 % — AB (ref 34.8–46.6)
HEMOGLOBIN: 11.6 g/dL (ref 11.6–15.9)
LYMPH#: 0.8 10*3/uL — AB (ref 0.9–3.3)
LYMPH%: 29.3 % (ref 14.0–49.7)
MCH: 33.5 pg (ref 25.1–34.0)
MCHC: 34.4 g/dL (ref 31.5–36.0)
MCV: 97.4 fL (ref 79.5–101.0)
MONO#: 0.3 10*3/uL (ref 0.1–0.9)
MONO%: 11 % (ref 0.0–14.0)
NEUT#: 1.6 10*3/uL (ref 1.5–6.5)
NEUT%: 57.9 % (ref 38.4–76.8)
Platelets: 227 10*3/uL (ref 145–400)
RBC: 3.46 10*6/uL — ABNORMAL LOW (ref 3.70–5.45)
RDW: 14.9 % — ABNORMAL HIGH (ref 11.2–14.5)
WBC: 2.8 10*3/uL — ABNORMAL LOW (ref 3.9–10.3)

## 2016-01-19 LAB — COMPREHENSIVE METABOLIC PANEL
ALT: 47 U/L (ref 0–55)
AST: 69 U/L — AB (ref 5–34)
Albumin: 3.9 g/dL (ref 3.5–5.0)
Alkaline Phosphatase: 79 U/L (ref 40–150)
Anion Gap: 8 mEq/L (ref 3–11)
BUN: 13.6 mg/dL (ref 7.0–26.0)
CALCIUM: 10.5 mg/dL — AB (ref 8.4–10.4)
CHLORIDE: 107 meq/L (ref 98–109)
CO2: 24 meq/L (ref 22–29)
CREATININE: 0.8 mg/dL (ref 0.6–1.1)
EGFR: 83 mL/min/{1.73_m2} — ABNORMAL LOW (ref >90)
GLUCOSE: 80 mg/dL (ref 70–140)
POTASSIUM: 4 meq/L (ref 3.5–5.1)
SODIUM: 140 meq/L (ref 136–145)
Total Bilirubin: 0.44 mg/dL (ref 0.20–1.20)
Total Protein: 7.8 g/dL (ref 6.4–8.3)

## 2016-01-19 LAB — TECHNOLOGIST REVIEW

## 2016-01-19 MED ORDER — CAPECITABINE 500 MG PO TABS: 1500.0000 mg | ORAL_TABLET | Freq: Two times a day (BID) | ORAL | Status: DC

## 2016-01-19 MED ORDER — SODIUM CHLORIDE 0.9 % IV SOLN
Freq: Once | INTRAVENOUS | Status: AC
  Administered 2016-01-19: 13:00:00 via INTRAVENOUS

## 2016-01-19 MED ORDER — ZOLEDRONIC ACID 4 MG/100ML IV SOLN
4.0000 mg | Freq: Once | INTRAVENOUS | Status: AC
  Administered 2016-01-19: 4 mg via INTRAVENOUS
  Filled 2016-01-19: qty 100

## 2016-01-19 NOTE — Patient Instructions (Signed)

## 2016-01-19 NOTE — Progress Notes (Signed)
[place] OFFICE PROGRESS NOTE   Diagnosis:  Breast cancer  INTERVAL HISTORY:    Ms. Eanes returns as scheduled. She was treated with Zometa on 12/24/2015. She tolerated the Zometa well. She reports persistent pain at the lower neck. She has intermittent pain at the left lower back. She saw Dr. Albertina Parr 01/15/2016. Dr. Albertina Parr recommends radiation to the area of pain. There is not a current clinical trial available at [facility]. Dr. Albertina Parr recommends Xeloda off protocol.  Objective:  Vital signs in last 24 hours:  Blood pressure 106/56, pulse 63, temperature 98 F (36.7 C), temperature source Oral, resp. rate 17, height '5\' 5"'  (1.651 m), weight 165 lb 12.8 oz (75.206 kg), last menstrual period 10/08/2014, SpO2 100 %.    HEENT:  Neck without mass Lymphatics:  No cervical or supra-clavicular nodes. 0.5cm mobile left axillary node. Resp:  Lungs clear bilaterally Cardio:  Regular rate and rhythm GI:  No hepatomegaly Vascular:  No leg edema  musculoskeletal: no neck tenderness     Lab Results:  Lab Results  Component Value Date   WBC 2.8* 01/19/2016   HGB 11.6 01/19/2016   HCT 33.7* 01/19/2016   MCV 97.4 01/19/2016   PLT 227 01/19/2016   NEUTROABS 1.6 01/19/2016    calcium 10.5 Medications: I have reviewed the patient's current medications.  Assessment/Plan: 1. Multicentric invasive lobular carcinoma of the right breast diagnosed in December of 1999, a right mastectomy followed by adjuvant [facility] chemotherapy, 5 years of tamoxifen, and 5 years of Femara. The Femara was completed in June of 2010. The right breast mass excision in January 2000 was ER positive, PR positive, and HER-2 negative  2. Pain at the left groin area with a CT of the pelvis on 02/07/12 confirming a destructive lytic lesion at the left pubic symphysis, status post a CT-guided biopsy on 02/16/12 confirming metastatic carcinoma, focally ER positive .  -PET scan 03/07/2012 confirmed multiple hypermetabolic  bone metastases with no other evidence of metastatic disease  -Status post palliative radiation to the pelvis beginning on 03/12/2012  -Initiation of Arimidex on 03/27/2012 -initiation of every three-month xgeva on 04/04/2012  -PET scan September 14 [id number] new hypermetabolic bone lesions  -Initiation of Faslodex 09/27/2012  -Restaging PET scan 04/08/2013 with evidence of disease progression in the bones  -Initiation of Aromasin/afinitor 05/29/2013  -Iliac biopsy at [facility] 05/22/2013 confirming HER-2 negative metastatic breast  -PET scan 11/18/2013 with a decrease in hypermetabolic activity  -PET scan 10/08/2014 with progressive diffuse hypermetabolic bone metastases -Initiation of Palbociclib 10/20/2014; Faslodex 10/23/2014. -Palbociclib placed on hold beginning 11/07/2014 due to neutropenia. -Palbociclib resumed 11/27/2014 on an every other day schedule -Palbociclib resumed 01/04/2015 at a reduced dose of 100 mg daily for 21 days. -Palbociclib beginning 02/12/2015 at a dose of 100 mg daily for 14 days on/14 days off. -PET scan 05/18/2015 with diffuse hypermetabolic bone lesions, increased metabolic activity compared to the PET scan from January 2016 -Palbociclib resumed 06/07/2015, Faslodex continued 06/12/2015 -PET scan 09/22/2015 with diffuse hypermetabolic bone lesions, increased metabolic activity compared to the PET scan 05/18/2015 -Continuation of palbociclib and Faslodex -palbociclib and Faslodex discontinued April 2017 secondary to a C7 spinous process fracture and hypercalcemia - Xeloda (C7 days on/7 days off) sees beginning 01/25/2016 3. BRCA1 variant felt to be a polymorphism  4. Pain/tenderness at the left low anterolateral chest wall-she completed palliative radiation on 05/28/2012 with improvement in the pain. She was also treated with radiation to the thoracic spine.  5. Anemia/leukopenia-likely secondary to  breast cancer involving the bone marrow and radiation  /afinitor -the anemia is improved  6. History of mildly elevated liver enzymes-? Related to afinitor,? Secondary to metastatic breast cancer 7. Neutropenia secondary to Palbociclib. Improved. 8. Pain at the left shoulder and mid back-a plain x-ray of the left shoulder 06/12/2015 revealed a sclerotic density at the left scapula adjacent to the glenoid, there is metastatic disease involving the humeral head and left scapula on the PET scan 05/18/2015. She completed a course of radiation to the left shoulder on 08/05/2015. 9. Mild hypercalcemia-asymptomatic; Delton See 11/26/2015; persistent/progressive hypercalcemia 12/24/2015. She received Zometa 12/24/2015 Ann 01/19/2016 10. Low neck/upper back pain 12/24/2015 with MRI showing a mildly displaced acute-appearing C7 spinous process fracture; widespread osseous metastatic disease; no evidence of epidural tumor or spinal cord compression. - referred for  Palliative radiation to the cervical spine 01/19/2016   Disposition:   Ms. Colclasure appears unchanged. I discussed treatment options with her. I reviewed the recommendations by Dr. Albertina Parr. We will refer her for a restaging PET scan. I discussed the case with Dr. Orlene Erm. He will see her to consider pale radiation to the cervical spine. He is comfortable beginning Xeloda concurrent with radiation.    Ms. Maiolo will receive Zometa today. The plan is to begin Xeloda 01/25/2016.  I reviewed the potential toxicities associated with Xeloda including the chance for mucositis, diarrhea, and hematologic toxicity. We discussed the rash, sun sensitivity, hyperpigmentation, and hand/foot syndrome associated with Xeloda. She agrees to proceed.   Ms. Burry will be scheduled for an office and lab visit 02/18/2016.     [name], MD  01/19/2016  1:36 PM

## 2016-01-19 NOTE — Telephone Encounter (Signed)
01/19/16: New Rx for Xeloda faxed to [facility]

## 2016-01-19 NOTE — Telephone Encounter (Signed)
per pof to sh pt appt-gave pt copy of avs-Shameeka sch zometa

## 2016-01-21 ENCOUNTER — Encounter: Payer: Self-pay | Admitting: Oncology

## 2016-01-21 NOTE — Progress Notes (Signed)
I gave rejection note to Stacy Horn -- see prev notes.

## 2016-01-22 ENCOUNTER — Other Ambulatory Visit: Payer: Self-pay

## 2016-01-22 DIAGNOSIS — C50911 Malignant neoplasm of unspecified site of right female breast: Secondary | ICD-10-CM | POA: Diagnosis not present

## 2016-01-22 DIAGNOSIS — C7951 Secondary malignant neoplasm of bone: Secondary | ICD-10-CM

## 2016-01-22 DIAGNOSIS — C50919 Malignant neoplasm of unspecified site of unspecified female breast: Secondary | ICD-10-CM

## 2016-01-22 MED ORDER — HYDROCODONE-ACETAMINOPHEN 7.5-325 MG PO TABS: 1.0000 | ORAL_TABLET | ORAL | Status: DC | PRN

## 2016-01-22 NOTE — Telephone Encounter (Signed)
Patient calling requesting Norco refill to be picked up on Monday.  Patient states she saw MD this week and both MD and patient forgot to give her a prescription when she was in to see him.

## 2016-01-22 NOTE — Addendum Note (Signed)
Addended by: Brien Few on: 01/22/2016 02:33 PM   Modules accepted: Orders

## 2016-01-22 NOTE — Telephone Encounter (Signed)
Left message informing pt prescription will be left up front for pick up. Apologized for the inconvenience.

## 2016-01-25 DIAGNOSIS — C50911 Malignant neoplasm of unspecified site of right female breast: Secondary | ICD-10-CM | POA: Diagnosis not present

## 2016-01-25 DIAGNOSIS — Z510 Encounter for antineoplastic radiation therapy: Secondary | ICD-10-CM | POA: Diagnosis not present

## 2016-01-25 DIAGNOSIS — C7951 Secondary malignant neoplasm of bone: Secondary | ICD-10-CM | POA: Diagnosis not present

## 2016-01-26 ENCOUNTER — Ambulatory Visit (HOSPITAL_COMMUNITY)
Admission: RE | Admit: 2016-01-26 | Discharge: 2016-01-26 | Disposition: A | Discharge status: Home / Self Care | Payer: Medicare Other | Source: Ambulatory Visit | Attending: Oncology | Admitting: Oncology

## 2016-01-26 DIAGNOSIS — C50911 Malignant neoplasm of unspecified site of right female breast: Secondary | ICD-10-CM | POA: Diagnosis not present

## 2016-01-26 DIAGNOSIS — C50919 Malignant neoplasm of unspecified site of unspecified female breast: Secondary | ICD-10-CM | POA: Diagnosis not present

## 2016-01-26 DIAGNOSIS — C7951 Secondary malignant neoplasm of bone: Secondary | ICD-10-CM | POA: Insufficient documentation

## 2016-01-26 LAB — GLUCOSE, CAPILLARY: Glucose-Capillary: 89 mg/dL (ref 65–99)

## 2016-01-26 MED ORDER — FLUDEOXYGLUCOSE F - 18 (FDG) INJECTION
8.1900 | Freq: Once | INTRAVENOUS | Status: AC | PRN
  Administered 2016-01-26: 8.19 via INTRAVENOUS

## 2016-01-28 ENCOUNTER — Telehealth: Payer: Self-pay | Admitting: *Deleted

## 2016-01-28 DIAGNOSIS — Z510 Encounter for antineoplastic radiation therapy: Secondary | ICD-10-CM | POA: Diagnosis not present

## 2016-01-28 DIAGNOSIS — C7951 Secondary malignant neoplasm of bone: Secondary | ICD-10-CM | POA: Diagnosis not present

## 2016-01-28 DIAGNOSIS — C50911 Malignant neoplasm of unspecified site of right female breast: Secondary | ICD-10-CM | POA: Diagnosis not present

## 2016-01-28 NOTE — Telephone Encounter (Signed)
Per Dr. Benay Spice, pt.'s husband notified that PET is consistent with disease progression in several bone areas and that MD will review films at next visit.  Pt is currently at her XRT tx and pt.'s husband will relay message to her.  Pt.'s husband appreciative of call and has no questions at this time.

## 2016-01-28 NOTE — Telephone Encounter (Signed)
-----   Message from Ladell Pier, MD sent at 01/27/2016  5:50 PM EDT ----- Please call patient, PET consistent with disease progression in several bone areas, will review films at next visit

## 2016-01-29 DIAGNOSIS — Z510 Encounter for antineoplastic radiation therapy: Secondary | ICD-10-CM | POA: Diagnosis not present

## 2016-01-29 DIAGNOSIS — C7951 Secondary malignant neoplasm of bone: Secondary | ICD-10-CM | POA: Diagnosis not present

## 2016-01-29 DIAGNOSIS — C50911 Malignant neoplasm of unspecified site of right female breast: Secondary | ICD-10-CM | POA: Diagnosis not present

## 2016-02-01 ENCOUNTER — Encounter: Payer: Self-pay | Admitting: Oncology

## 2016-02-01 DIAGNOSIS — C50911 Malignant neoplasm of unspecified site of right female breast: Secondary | ICD-10-CM | POA: Diagnosis not present

## 2016-02-01 DIAGNOSIS — Z510 Encounter for antineoplastic radiation therapy: Secondary | ICD-10-CM | POA: Diagnosis not present

## 2016-02-01 DIAGNOSIS — C7951 Secondary malignant neoplasm of bone: Secondary | ICD-10-CM | POA: Diagnosis not present

## 2016-02-01 NOTE — Progress Notes (Signed)
Per [facility] capecitabine was shipped via [facility] 01/29/16

## 2016-02-02 ENCOUNTER — Encounter: Payer: Self-pay | Admitting: Pharmacist

## 2016-02-02 DIAGNOSIS — C50911 Malignant neoplasm of unspecified site of right female breast: Secondary | ICD-10-CM | POA: Diagnosis not present

## 2016-02-02 DIAGNOSIS — Z510 Encounter for antineoplastic radiation therapy: Secondary | ICD-10-CM | POA: Diagnosis not present

## 2016-02-02 DIAGNOSIS — C7951 Secondary malignant neoplasm of bone: Secondary | ICD-10-CM | POA: Diagnosis not present

## 2016-02-02 NOTE — Progress Notes (Signed)
Oral Chemotherapy Pharmacist Encounter   I spoke with patient for overview of new oral chemotherapy medication: Xeloda. Pt is doing well. The prescriptions have been sent to the [facility] outpatient pharmacy for benefit analysis and approval. Xeloda requires [facility] specialty pharmacy. Patient signed up for patient advocate patient assistance grant. Patient received medication on 5/12. Dr. Orlene Erm has now decided to hold off on starting xeloda until radiation is completed at the end of the month. Pt states she will begin Xeloda the last week in May  Counseled patient on administration, dosing, side effects, safe handling, and monitoring. Side effects include but not limited to: Diarrhea, rash, hand/foot syndrome, nausea, mouth sores.  Stacy Horn voiced understanding and appreciation.   All questions answered.  Will follow up in 2-3 weeks once xeloda has been started  Thank you,  [name], PharmD, [facility]

## 2016-02-03 DIAGNOSIS — Z510 Encounter for antineoplastic radiation therapy: Secondary | ICD-10-CM | POA: Diagnosis not present

## 2016-02-03 DIAGNOSIS — C7951 Secondary malignant neoplasm of bone: Secondary | ICD-10-CM | POA: Diagnosis not present

## 2016-02-03 DIAGNOSIS — C50911 Malignant neoplasm of unspecified site of right female breast: Secondary | ICD-10-CM | POA: Diagnosis not present

## 2016-02-04 DIAGNOSIS — C7951 Secondary malignant neoplasm of bone: Secondary | ICD-10-CM | POA: Diagnosis not present

## 2016-02-04 DIAGNOSIS — C50911 Malignant neoplasm of unspecified site of right female breast: Secondary | ICD-10-CM | POA: Diagnosis not present

## 2016-02-04 DIAGNOSIS — Z510 Encounter for antineoplastic radiation therapy: Secondary | ICD-10-CM | POA: Diagnosis not present

## 2016-02-05 DIAGNOSIS — C7951 Secondary malignant neoplasm of bone: Secondary | ICD-10-CM | POA: Diagnosis not present

## 2016-02-05 DIAGNOSIS — C50911 Malignant neoplasm of unspecified site of right female breast: Secondary | ICD-10-CM | POA: Diagnosis not present

## 2016-02-05 DIAGNOSIS — Z510 Encounter for antineoplastic radiation therapy: Secondary | ICD-10-CM | POA: Diagnosis not present

## 2016-02-08 DIAGNOSIS — C50911 Malignant neoplasm of unspecified site of right female breast: Secondary | ICD-10-CM | POA: Diagnosis not present

## 2016-02-08 DIAGNOSIS — Z510 Encounter for antineoplastic radiation therapy: Secondary | ICD-10-CM | POA: Diagnosis not present

## 2016-02-08 DIAGNOSIS — C7951 Secondary malignant neoplasm of bone: Secondary | ICD-10-CM | POA: Diagnosis not present

## 2016-02-09 DIAGNOSIS — C7951 Secondary malignant neoplasm of bone: Secondary | ICD-10-CM | POA: Diagnosis not present

## 2016-02-09 DIAGNOSIS — Z510 Encounter for antineoplastic radiation therapy: Secondary | ICD-10-CM | POA: Diagnosis not present

## 2016-02-09 DIAGNOSIS — C50911 Malignant neoplasm of unspecified site of right female breast: Secondary | ICD-10-CM | POA: Diagnosis not present

## 2016-02-10 DIAGNOSIS — C7951 Secondary malignant neoplasm of bone: Secondary | ICD-10-CM | POA: Diagnosis not present

## 2016-02-10 DIAGNOSIS — Z510 Encounter for antineoplastic radiation therapy: Secondary | ICD-10-CM | POA: Diagnosis not present

## 2016-02-10 DIAGNOSIS — C50911 Malignant neoplasm of unspecified site of right female breast: Secondary | ICD-10-CM | POA: Diagnosis not present

## 2016-02-11 DIAGNOSIS — C7951 Secondary malignant neoplasm of bone: Secondary | ICD-10-CM | POA: Diagnosis not present

## 2016-02-11 DIAGNOSIS — Z510 Encounter for antineoplastic radiation therapy: Secondary | ICD-10-CM | POA: Diagnosis not present

## 2016-02-11 DIAGNOSIS — C50911 Malignant neoplasm of unspecified site of right female breast: Secondary | ICD-10-CM | POA: Diagnosis not present

## 2016-02-12 DIAGNOSIS — C7951 Secondary malignant neoplasm of bone: Secondary | ICD-10-CM | POA: Diagnosis not present

## 2016-02-12 DIAGNOSIS — C50911 Malignant neoplasm of unspecified site of right female breast: Secondary | ICD-10-CM | POA: Diagnosis not present

## 2016-02-12 DIAGNOSIS — Z510 Encounter for antineoplastic radiation therapy: Secondary | ICD-10-CM | POA: Diagnosis not present

## 2016-02-15 DIAGNOSIS — Z510 Encounter for antineoplastic radiation therapy: Secondary | ICD-10-CM | POA: Diagnosis not present

## 2016-02-15 DIAGNOSIS — C7951 Secondary malignant neoplasm of bone: Secondary | ICD-10-CM | POA: Diagnosis not present

## 2016-02-15 DIAGNOSIS — C50911 Malignant neoplasm of unspecified site of right female breast: Secondary | ICD-10-CM | POA: Diagnosis not present

## 2016-02-16 DIAGNOSIS — C50911 Malignant neoplasm of unspecified site of right female breast: Secondary | ICD-10-CM | POA: Diagnosis not present

## 2016-02-16 DIAGNOSIS — C7951 Secondary malignant neoplasm of bone: Secondary | ICD-10-CM | POA: Diagnosis not present

## 2016-02-16 DIAGNOSIS — Z510 Encounter for antineoplastic radiation therapy: Secondary | ICD-10-CM | POA: Diagnosis not present

## 2016-02-18 ENCOUNTER — Telehealth: Payer: Self-pay | Admitting: Oncology

## 2016-02-18 ENCOUNTER — Ambulatory Visit (HOSPITAL_BASED_OUTPATIENT_CLINIC_OR_DEPARTMENT_OTHER): Payer: Medicare Other

## 2016-02-18 ENCOUNTER — Ambulatory Visit (HOSPITAL_BASED_OUTPATIENT_CLINIC_OR_DEPARTMENT_OTHER): Payer: 59 | Admitting: Nurse Practitioner

## 2016-02-18 ENCOUNTER — Other Ambulatory Visit (HOSPITAL_BASED_OUTPATIENT_CLINIC_OR_DEPARTMENT_OTHER): Payer: Medicare Other

## 2016-02-18 VITALS — BP 111/63 | HR 74 | Temp 98.4°F | Resp 16 | Ht 65.0 in | Wt 161.5 lb

## 2016-02-18 DIAGNOSIS — C50911 Malignant neoplasm of unspecified site of right female breast: Secondary | ICD-10-CM

## 2016-02-18 DIAGNOSIS — C7951 Secondary malignant neoplasm of bone: Principal | ICD-10-CM

## 2016-02-18 DIAGNOSIS — R1310 Dysphagia, unspecified: Secondary | ICD-10-CM | POA: Diagnosis not present

## 2016-02-18 DIAGNOSIS — C50919 Malignant neoplasm of unspecified site of unspecified female breast: Secondary | ICD-10-CM

## 2016-02-18 DIAGNOSIS — Z9889 Other specified postprocedural states: Secondary | ICD-10-CM

## 2016-02-18 LAB — CBC WITH DIFFERENTIAL/PLATELET
BASO%: 0.2 % (ref 0.0–2.0)
BASOS ABS: 0 10*3/uL (ref 0.0–0.1)
EOS%: 0.5 % (ref 0.0–7.0)
Eosinophils Absolute: 0 10*3/uL (ref 0.0–0.5)
HCT: 35.6 % (ref 34.8–46.6)
HEMOGLOBIN: 11.9 g/dL (ref 11.6–15.9)
LYMPH#: 0.9 10*3/uL (ref 0.9–3.3)
LYMPH%: 20.2 % (ref 14.0–49.7)
MCH: 32.5 pg (ref 25.1–34.0)
MCHC: 33.4 g/dL (ref 31.5–36.0)
MCV: 97.3 fL (ref 79.5–101.0)
MONO#: 0.5 10*3/uL (ref 0.1–0.9)
MONO%: 11 % (ref 0.0–14.0)
NEUT%: 68.1 % (ref 38.4–76.8)
NEUTROS ABS: 3 10*3/uL (ref 1.5–6.5)
NRBC: 1 % — AB (ref 0–0)
Platelets: 206 10*3/uL (ref 145–400)
RBC: 3.66 10*6/uL — ABNORMAL LOW (ref 3.70–5.45)
RDW: 14.9 % — AB (ref 11.2–14.5)
WBC: 4.4 10*3/uL (ref 3.9–10.3)

## 2016-02-18 LAB — COMPREHENSIVE METABOLIC PANEL
ALBUMIN: 4 g/dL (ref 3.5–5.0)
ALK PHOS: 102 U/L (ref 40–150)
ALT: 41 U/L (ref 0–55)
AST: 92 U/L — AB (ref 5–34)
Anion Gap: 9 mEq/L (ref 3–11)
BILIRUBIN TOTAL: 0.65 mg/dL (ref 0.20–1.20)
BUN: 12.8 mg/dL (ref 7.0–26.0)
CO2: 27 mEq/L (ref 22–29)
Calcium: 11.6 mg/dL — ABNORMAL HIGH (ref 8.4–10.4)
Chloride: 107 mEq/L (ref 98–109)
Creatinine: 0.9 mg/dL (ref 0.6–1.1)
EGFR: 72 mL/min/{1.73_m2} — ABNORMAL LOW (ref >90)
GLUCOSE: 92 mg/dL (ref 70–140)
Potassium: 4.1 mEq/L (ref 3.5–5.1)
SODIUM: 143 meq/L (ref 136–145)
TOTAL PROTEIN: 8.1 g/dL (ref 6.4–8.3)

## 2016-02-18 MED ORDER — ZOLEDRONIC ACID 4 MG/100ML IV SOLN
4.0000 mg | Freq: Once | INTRAVENOUS | Status: AC
  Administered 2016-02-18: 4 mg via INTRAVENOUS
  Filled 2016-02-18: qty 100

## 2016-02-18 MED ORDER — HYDROCODONE-ACETAMINOPHEN 7.5-325 MG/15ML PO SOLN: 15.0000 mL | ORAL | Status: DC | PRN

## 2016-02-18 MED ORDER — TRIAMCINOLONE ACETONIDE 0.1 % EX CREA: TOPICAL_CREAM | Freq: Two times a day (BID) | CUTANEOUS | Status: DC

## 2016-02-18 MED ORDER — SODIUM CHLORIDE 0.9 % IV SOLN
Freq: Once | INTRAVENOUS | Status: AC
  Administered 2016-02-18: 12:00:00 via INTRAVENOUS

## 2016-02-18 NOTE — Patient Instructions (Signed)

## 2016-02-18 NOTE — Telephone Encounter (Signed)
Gave and printed appt sched and avs for pt for June °

## 2016-02-18 NOTE — Progress Notes (Addendum)
[place] OFFICE PROGRESS NOTE   Diagnosis:  Breast cancer  INTERVAL HISTORY:   Stacy Horn returns as scheduled. She completed radiation to the cervical spine 02/16/2016. She has developed dysphagia/odynophagia. She is tolerating liquids. She is having difficulty swallowing pills. As such, she has not started Xeloda. She denies nausea/vomiting. No diarrhea. Upper back/neck pain is better.  Objective:  Vital signs in last 24 hours:  Blood pressure 111/63, pulse 74, temperature 98.4 F (36.9 C), temperature source Oral, resp. rate 16, height _0  (1.651 m), weight 161 lb 8 oz (73.256 kg), last menstrual period 10/08/2014, SpO2 97 %.    HEENT: No thrush or ulcers. Lymphatics: Small, less than 1 cm, left axillary node. No cervical or supra-clavicular nodes. Resp: Lungs clear bilaterally. Cardio: Regular rate and rhythm. GI: Abdomen soft and nontender. No hepatomegaly. Vascular: No leg edema.    Lab Results:  Lab Results  Component Value Date   WBC 4.4 02/18/2016   HGB 11.9 02/18/2016   HCT 35.6 02/18/2016   MCV 97.3 02/18/2016   PLT 206 02/18/2016   NEUTROABS 3.0 02/18/2016    Imaging:  No results found.  Medications: I have reviewed the patient's current medications.  Assessment/Plan: 1. Multicentric invasive lobular carcinoma of the right breast diagnosed in December of 1999, a right mastectomy followed by adjuvant [facility] chemotherapy, 5 years of tamoxifen, and 5 years of Femara. The Femara was completed in June of 2010. The right breast mass excision in January 2000 was ER positive, PR positive, and HER-2 negative  2. Pain at the left groin area with a CT of the pelvis on 02/07/12 confirming a destructive lytic lesion at the left pubic symphysis, status post a CT-guided biopsy on 02/16/12 confirming metastatic carcinoma, focally ER positive .  -PET scan 03/07/2012 confirmed multiple hypermetabolic bone metastases with no other evidence of metastatic  disease  -Status post palliative radiation to the pelvis beginning on 03/12/2012  -Initiation of Arimidex on 03/27/2012 -initiation of every three-month xgeva on 04/04/2012  -PET scan September 14 [id number] new hypermetabolic bone lesions  -Initiation of Faslodex 09/27/2012  -Restaging PET scan 04/08/2013 with evidence of disease progression in the bones  -Initiation of Aromasin/afinitor 05/29/2013  -Iliac biopsy at [facility] 05/22/2013 confirming HER-2 negative metastatic breast  -PET scan 11/18/2013 with a decrease in hypermetabolic activity  -PET scan 10/08/2014 with progressive diffuse hypermetabolic bone metastases -Initiation of Palbociclib 10/20/2014; Faslodex 10/23/2014. -Palbociclib placed on hold beginning 11/07/2014 due to neutropenia. -Palbociclib resumed 11/27/2014 on an every other day schedule -Palbociclib resumed 01/04/2015 at a reduced dose of 100 mg daily for 21 days. -Palbociclib beginning 02/12/2015 at a dose of 100 mg daily for 14 days on/14 days off. -PET scan 05/18/2015 with diffuse hypermetabolic bone lesions, increased metabolic activity compared to the PET scan from January 2016 -Palbociclib resumed 06/07/2015, Faslodex continued 06/12/2015 -PET scan 09/22/2015 with diffuse hypermetabolic bone lesions, increased metabolic activity compared to the PET scan 05/18/2015 -Continuation of palbociclib and Faslodex -palbociclib and Faslodex discontinued April 2017 secondary to a C7 spinous process fracture and hypercalcemia - Xeloda (7 days on/7 days off) anticipated start date 02/25/2016 3. BRCA1 variant felt to be a polymorphism  4. Pain/tenderness at the left low anterolateral chest wall-she completed palliative radiation on 05/28/2012 with improvement in the pain. She was also treated with radiation to the thoracic spine.  5. Anemia/leukopenia-likely secondary to breast cancer involving the bone marrow and radiation /afinitor -the anemia is improved  6. History  of mildly elevated  liver enzymes-? Related to afinitor,? Secondary to metastatic breast cancer 7. Neutropenia secondary to Palbociclib. Improved. 8. Pain at the left shoulder and mid back-a plain x-ray of the left shoulder 06/12/2015 revealed a sclerotic density at the left scapula adjacent to the glenoid, there is metastatic disease involving the humeral head and left scapula on the PET scan 05/18/2015. She completed a course of radiation to the left shoulder on 08/05/2015. 9. Mild hypercalcemia-asymptomatic; Delton See 11/26/2015; persistent/progressive hypercalcemia 12/24/2015. She received Zometa 12/24/2015, 01/19/2016, 02/18/2016. 10. Low neck/upper back pain 12/24/2015 with MRI showing a mildly displaced acute-appearing C7 spinous process fracture; widespread osseous metastatic disease; no evidence of epidural tumor or spinal cord compression. - Completed palliative radiation to the cervical spine 02/17/2016 11. Dysphagia/odynophagia secondary to radiation  Disposition: Stacy Horn has completed the course of palliative radiation to the cervical spine. She is experiencing dysphagia/odynophagia related to the radiation. She is tolerating liquids. She is having difficulty with pills. She will begin Xeloda when she is able to swallow without difficulty. We anticipate she will begin Xeloda in approximately one week. We changed her pain medication to hydrocodone liquid form.  She will receive Zometa today for hypercalcemia and bone metastases.  She will return for a follow-up visit in 4 weeks. She will contact the office in the interim with any problems.  Patient seen with Dr. Benay Spice.    Ned Card ANP/GNP-BC   02/18/2016  11:18 AM  This was a shared visit with Ned Card. 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 She will start Xeloda once the symptoms improve.  Julieanne Manson, M.D.

## 2016-02-29 ENCOUNTER — Encounter: Payer: Self-pay | Admitting: *Deleted

## 2016-02-29 NOTE — Progress Notes (Signed)
Pt called to inform office that she began taking her Xeloda on 02/28/16 without difficulties.  L. Marcello Moores NP notified.

## 2016-03-03 ENCOUNTER — Telehealth: Payer: Self-pay | Admitting: Pharmacist

## 2016-03-03 DIAGNOSIS — C50919 Malignant neoplasm of unspecified site of unspecified female breast: Secondary | ICD-10-CM

## 2016-03-03 DIAGNOSIS — C7951 Secondary malignant neoplasm of bone: Secondary | ICD-10-CM

## 2016-03-03 MED ORDER — HYDROCODONE-ACETAMINOPHEN 7.5-325 MG PO TABS: 1.0000 | ORAL_TABLET | ORAL | Status: DC | PRN

## 2016-03-03 NOTE — Telephone Encounter (Addendum)
Per Dr. Benay Spice: Come in 6/29 as scheduled. Pt voiced understanding, she requests Hydrocodone Rx to be ready for pick up on 6/19. She is able to swallow tablets again. Rx will be left in prescription book for pick up.

## 2016-03-03 NOTE — Addendum Note (Signed)
Addended by: Brien Few on: 03/03/2016 05:19 PM   Modules accepted: Orders

## 2016-03-03 NOTE — Telephone Encounter (Signed)
Oral Chemotherapy Follow-Up Form  Original Start date of oral chemotherapy: 02-28-16  Called patient today to follow up regarding patient's oral chemotherapy medication: Xeloda Take 1,500 mg by mouth 2 (two) times daily after a meal. Days 1-7 and 15-21 (14 days total each month)   Pt is doing well today, has not experienced any major side effects since recently starting Xeloda, at times feels a little nausea but that is does not last long. Asked patient is she thought she needed something for nausea, but states she would like to "get used" to the Xeloda and see how she feels after being on it for longer. States she is not having any problems swallowing the capsules at this time, and knows to take it after a meal.  Pt reports 0 tablets/doses missed in the last week/month.   Pt reports the following side effects: mild nausea  Will follow up and call patient again in 2 weeks.  Thank you,  Skip Mayer, PharmD, BCPS [facility]

## 2016-03-08 ENCOUNTER — Telehealth: Payer: Self-pay | Admitting: *Deleted

## 2016-03-08 ENCOUNTER — Encounter: Payer: Self-pay | Admitting: *Deleted

## 2016-03-08 NOTE — Telephone Encounter (Signed)
FYI  Call from patient in [facility] lobby while here to pick up prescription.  Reports she "just left her cosmetologist thirty minutes ago.  Head felt heavy when she stood up.  I thought you feel light when about to pass out.  I was told I did not loose any color.  I do not have a headache or migraine today.  Could I have my B/P checked?  I'm eating and drinking, no constipation or any other problems."  Checked B/P which is WNL (115/66).  Has access to husbands B/P cuff so this nurse advised she check B/P at home to monitor.  Get up slowly to have her bearings before getting up.  Continue eating and drinking well 64 oz minimum.

## 2016-03-09 ENCOUNTER — Telehealth: Payer: Self-pay | Admitting: *Deleted

## 2016-03-09 NOTE — Telephone Encounter (Signed)
Received refill request for Xeloda from [facility], given to Dr. Benay Spice.  He signed, dated and completed the request.  Faxed back to [facility] at [phone].  Fax confirmation received.

## 2016-03-17 ENCOUNTER — Telehealth: Payer: Self-pay | Admitting: Oncology

## 2016-03-17 ENCOUNTER — Ambulatory Visit (HOSPITAL_BASED_OUTPATIENT_CLINIC_OR_DEPARTMENT_OTHER): Payer: Medicare Other

## 2016-03-17 ENCOUNTER — Other Ambulatory Visit (HOSPITAL_BASED_OUTPATIENT_CLINIC_OR_DEPARTMENT_OTHER): Payer: Medicare Other

## 2016-03-17 ENCOUNTER — Ambulatory Visit (HOSPITAL_BASED_OUTPATIENT_CLINIC_OR_DEPARTMENT_OTHER): Payer: 59 | Admitting: Oncology

## 2016-03-17 VITALS — BP 105/65 | HR 66 | Temp 98.2°F | Resp 18 | Ht 65.0 in | Wt 164.2 lb

## 2016-03-17 DIAGNOSIS — C7951 Secondary malignant neoplasm of bone: Secondary | ICD-10-CM | POA: Diagnosis not present

## 2016-03-17 DIAGNOSIS — Z9889 Other specified postprocedural states: Secondary | ICD-10-CM

## 2016-03-17 DIAGNOSIS — C50919 Malignant neoplasm of unspecified site of unspecified female breast: Secondary | ICD-10-CM

## 2016-03-17 DIAGNOSIS — D649 Anemia, unspecified: Secondary | ICD-10-CM | POA: Diagnosis not present

## 2016-03-17 DIAGNOSIS — C50911 Malignant neoplasm of unspecified site of right female breast: Secondary | ICD-10-CM

## 2016-03-17 LAB — CBC WITH DIFFERENTIAL/PLATELET
BASO%: 0.4 % (ref 0.0–2.0)
Basophils Absolute: 0 10*3/uL (ref 0.0–0.1)
EOS%: 1 % (ref 0.0–7.0)
Eosinophils Absolute: 0 10*3/uL (ref 0.0–0.5)
HEMATOCRIT: 29.4 % — AB (ref 34.8–46.6)
HGB: 10 g/dL — ABNORMAL LOW (ref 11.6–15.9)
LYMPH#: 0.6 10*3/uL — AB (ref 0.9–3.3)
LYMPH%: 19.5 % (ref 14.0–49.7)
MCH: 33.4 pg (ref 25.1–34.0)
MCHC: 34.1 g/dL (ref 31.5–36.0)
MCV: 97.7 fL (ref 79.5–101.0)
MONO#: 0.2 10*3/uL (ref 0.1–0.9)
MONO%: 8.1 % (ref 0.0–14.0)
NEUT%: 71 % (ref 38.4–76.8)
NEUTROS ABS: 2.1 10*3/uL (ref 1.5–6.5)
Platelets: 156 10*3/uL (ref 145–400)
RBC: 3.01 10*6/uL — ABNORMAL LOW (ref 3.70–5.45)
RDW: 16 % — AB (ref 11.2–14.5)
WBC: 2.9 10*3/uL — AB (ref 3.9–10.3)

## 2016-03-17 LAB — COMPREHENSIVE METABOLIC PANEL
ALT: 46 U/L (ref 0–55)
AST: 43 U/L — AB (ref 5–34)
Albumin: 3.6 g/dL (ref 3.5–5.0)
Alkaline Phosphatase: 123 U/L (ref 40–150)
Anion Gap: 9 mEq/L (ref 3–11)
BUN: 11.3 mg/dL (ref 7.0–26.0)
CALCIUM: 9.7 mg/dL (ref 8.4–10.4)
CHLORIDE: 107 meq/L (ref 98–109)
CO2: 26 meq/L (ref 22–29)
CREATININE: 0.8 mg/dL (ref 0.6–1.1)
EGFR: 82 mL/min/{1.73_m2} — ABNORMAL LOW (ref >90)
GLUCOSE: 107 mg/dL (ref 70–140)
Potassium: 3.8 mEq/L (ref 3.5–5.1)
Sodium: 142 mEq/L (ref 136–145)
TOTAL PROTEIN: 7.3 g/dL (ref 6.4–8.3)
Total Bilirubin: 0.53 mg/dL (ref 0.20–1.20)

## 2016-03-17 LAB — TECHNOLOGIST REVIEW

## 2016-03-17 MED ORDER — SODIUM CHLORIDE 0.9 % IV SOLN
Freq: Once | INTRAVENOUS | Status: AC
  Administered 2016-03-17: 11:00:00 via INTRAVENOUS

## 2016-03-17 MED ORDER — ZOLEDRONIC ACID 4 MG/100ML IV SOLN
4.0000 mg | Freq: Once | INTRAVENOUS | Status: AC
  Administered 2016-03-17: 4 mg via INTRAVENOUS
  Filled 2016-03-17: qty 100

## 2016-03-17 NOTE — Patient Instructions (Signed)
Zoledronic Acid injection (Hypercalcemia, Oncology) (Zometa) What is this medicine? ZOLEDRONIC ACID (ZOE le dron ik AS id) lowers the amount of calcium loss from bone. It is used to treat too much calcium in your blood from cancer. It is also used to prevent complications of cancer that has spread to the bone. This medicine may be used for other purposes; ask your health care provider or pharmacist if you have questions. What should I tell my health care provider before I take this medicine? They need to know if you have any of these conditions: -aspirin-sensitive asthma -cancer, especially if you are receiving medicines used to treat cancer -dental disease or wear dentures -infection -kidney disease -receiving corticosteroids like dexamethasone or prednisone -an unusual or allergic reaction to zoledronic acid, other medicines, foods, dyes, or preservatives -pregnant or trying to get pregnant -breast-feeding How should I use this medicine? This medicine is for infusion into a vein. It is given by a health care professional in a hospital or clinic setting. Talk to your pediatrician regarding the use of this medicine in children. Special care may be needed. Overdosage: If you think you have taken too much of this medicine contact a poison control center or emergency room at once. NOTE: This medicine is only for you. Do not share this medicine with others. What if I miss a dose? It is important not to miss your dose. Call your doctor or health care professional if you are unable to keep an appointment. What may interact with this medicine? -certain antibiotics given by injection -NSAIDs, medicines for pain and inflammation, like ibuprofen or naproxen -some diuretics like bumetanide, furosemide -teriparatide -thalidomide This list may not describe all possible interactions. Give your health care provider a list of all the medicines, herbs, non-prescription drugs, or dietary supplements you  use. Also tell them if you smoke, drink alcohol, or use illegal drugs. Some items may interact with your medicine. What should I watch for while using this medicine? Visit your doctor or health care professional for regular checkups. It may be some time before you see the benefit from this medicine. Do not stop taking your medicine unless your doctor tells you to. Your doctor may order blood tests or other tests to see how you are doing. Women should inform their doctor if they wish to become pregnant or think they might be pregnant. There is a potential for serious side effects to an unborn child. Talk to your health care professional or pharmacist for more information. You should make sure that you get enough calcium and vitamin D while you are taking this medicine. Discuss the foods you eat and the vitamins you take with your health care professional. Some people who take this medicine have severe bone, joint, and/or muscle pain. This medicine may also increase your risk for jaw problems or a broken thigh bone. Tell your doctor right away if you have severe pain in your jaw, bones, joints, or muscles. Tell your doctor if you have any pain that does not go away or that gets worse. Tell your dentist and dental surgeon that you are taking this medicine. You should not have major dental surgery while on this medicine. See your dentist to have a dental exam and fix any dental problems before starting this medicine. Take good care of your teeth while on this medicine. Make sure you see your dentist for regular follow-up appointments. What side effects may I notice from receiving this medicine? Side effects that you should report   to your doctor or health care professional as soon as possible: -allergic reactions like skin rash, itching or hives, swelling of the face, lips, or tongue -anxiety, confusion, or depression -breathing problems -changes in vision -eye pain -feeling faint or lightheaded,  falls -jaw pain, especially after dental work -mouth sores -muscle cramps, stiffness, or weakness -redness, blistering, peeling or loosening of the skin, including inside the mouth -trouble passing urine or change in the amount of urine Side effects that usually do not require medical attention (report to your doctor or health care professional if they continue or are bothersome): -bone, joint, or muscle pain -constipation -diarrhea -fever -hair loss -irritation at site where injected -loss of appetite -nausea, vomiting -stomach upset -trouble sleeping -trouble swallowing -weak or tired This list may not describe all possible side effects. Call your doctor for medical advice about side effects. You may report side effects to FDA at 1-800-FDA-1088. Where should I keep my medicine? This drug is given in a hospital or clinic and will not be stored at home. NOTE: This sheet is a summary. It may not cover all possible information. If you have questions about this medicine, talk to your doctor, pharmacist, or health care provider.    2016, Elsevier/Gold Standard. (2014-02-01 14:19:39)  

## 2016-03-17 NOTE — Telephone Encounter (Signed)
Gave and printed appt sched and avs for pt for July  °

## 2016-03-17 NOTE — Progress Notes (Signed)
[place] OFFICE PROGRESS NOTE   Diagnosis: Breast cancer  INTERVAL HISTORY:   She returns as scheduled. She began Xeloda 02/28/2016. She is tolerating the Xeloda well. No mouth sores, hand/foot pain, or diarrhea. The back pain has improved. She takes Vicodin as needed. The Vicodin causes somnolence. She no longer has difficulty swallowing.  Objective:  Vital signs in last 24 hours:  Blood pressure 105/65, pulse 66, temperature 98.2 F (36.8 C), temperature source Oral, resp. rate 18, height _0  (1.651 m), weight 164 lb 3.2 oz (74.481 kg), last menstrual period 10/08/2014, SpO2 100 %.    HEENT: No thrush or ulcers Resp: Lungs clear bilaterally Cardio: Regular rate and rhythm GI: No hepatomegaly Vascular: No leg edema  Skin: Palms and soles without erythema, dryness at the dorsum of the hands    Lab Results:  Lab Results  Component Value Date   WBC 2.9* 03/17/2016   HGB 10.0* 03/17/2016   HCT 29.4* 03/17/2016   MCV 97.7 03/17/2016   PLT 156 03/17/2016   NEUTROABS 2.1 03/17/2016  Creatinine 0.8, calcium 9.7, albumin 3.6  Medications: I have reviewed the patient's current medications.  Assessment/Plan: 1. Multicentric invasive lobular carcinoma of the right breast diagnosed in December of 1999, a right mastectomy followed by adjuvant [facility] chemotherapy, 5 years of tamoxifen, and 5 years of Femara. The Femara was completed in June of 2010. The right breast mass excision in January 2000 was ER positive, PR positive, and HER-2 negative  2. Pain at the left groin area with a CT of the pelvis on 02/07/12 confirming a destructive lytic lesion at the left pubic symphysis, status post a CT-guided biopsy on 02/16/12 confirming metastatic carcinoma, focally ER positive .  -PET scan 03/07/2012 confirmed multiple hypermetabolic bone metastases with no other evidence of metastatic disease  -Status post palliative radiation to the pelvis beginning on 03/12/2012   -Initiation of Arimidex on 03/27/2012 -initiation of every three-month xgeva on 04/04/2012  -PET scan September 14 [id number] new hypermetabolic bone lesions  -Initiation of Faslodex 09/27/2012  -Restaging PET scan 04/08/2013 with evidence of disease progression in the bones  -Initiation of Aromasin/afinitor 05/29/2013  -Iliac biopsy at [facility] 05/22/2013 confirming HER-2 negative metastatic breast  -PET scan 11/18/2013 with a decrease in hypermetabolic activity  -PET scan 10/08/2014 with progressive diffuse hypermetabolic bone metastases -Initiation of Palbociclib 10/20/2014; Faslodex 10/23/2014. -Palbociclib placed on hold beginning 11/07/2014 due to neutropenia. -Palbociclib resumed 11/27/2014 on an every other day schedule -Palbociclib resumed 01/04/2015 at a reduced dose of 100 mg daily for 21 days. -Palbociclib beginning 02/12/2015 at a dose of 100 mg daily for 14 days on/14 days off. -PET scan 05/18/2015 with diffuse hypermetabolic bone lesions, increased metabolic activity compared to the PET scan from January 2016 -Palbociclib resumed 06/07/2015, Faslodex continued 06/12/2015 -PET scan 09/22/2015 with diffuse hypermetabolic bone lesions, increased metabolic activity compared to the PET scan 05/18/2015 -Continuation of palbociclib and Faslodex -palbociclib and Faslodex discontinued April 2017 secondary to a C7 spinous process fracture and hypercalcemia - Xeloda (7 days on/7 days off) started 02/28/2016 3. BRCA1 variant felt to be a polymorphism  4. Pain/tenderness at the left low anterolateral chest wall-she completed palliative radiation on 05/28/2012 with improvement in the pain. She was also treated with radiation to the thoracic spine.  5. Anemia/leukopenia-likely secondary to breast cancer involving the bone marrow and radiation /afinitor -the anemia is improved  6. History of mildly elevated liver enzymes-? Related to afinitor,? Secondary to metastatic breast cancer 7.  Neutropenia secondary to Palbociclib. Improved. 8. Pain at the left shoulder and mid back-a plain x-ray of the left shoulder 06/12/2015 revealed a sclerotic density at the left scapula adjacent to the glenoid, there is metastatic disease involving the humeral head and left scapula on the PET scan 05/18/2015. She completed a course of radiation to the left shoulder on 08/05/2015. 9. Mild hypercalcemia-asymptomatic; Delton See 11/26/2015; persistent/progressive hypercalcemia 12/24/2015. She received Zometa 12/24/2015, 01/19/2016, 02/18/2016, and 03/17/2016 10. Low neck/upper back pain 12/24/2015 with MRI showing a mildly displaced acute-appearing C7 spinous process fracture; widespread osseous metastatic disease; no evidence of epidural tumor or spinal cord compression. - Completed palliative radiation to the cervical spine 02/17/2016 11. Dysphagia/odynophagia secondary to radiation-resolved   Disposition:  Ms. Bolinger appears stable. She is tolerating the Xeloda well. The anemia is likely secondary to chemotherapy, radiation, and metastatic breast cancer involving the bone marrow. She will contact us for symptoms of anemia.  Ms. Sheller will receive Zometa today. The hypercalcemia is improved. She will return for an office visit and Zometa in one month.  [name], MD  03/17/2016  10:56 AM

## 2016-03-21 ENCOUNTER — Encounter: Payer: Self-pay | Admitting: Oncology

## 2016-03-21 NOTE — Progress Notes (Signed)
Per [facility] capecitabine shipped 03/18/16 via [facility]

## 2016-04-04 ENCOUNTER — Telehealth: Payer: Self-pay

## 2016-04-04 DIAGNOSIS — C50919 Malignant neoplasm of unspecified site of unspecified female breast: Secondary | ICD-10-CM

## 2016-04-04 DIAGNOSIS — C7951 Secondary malignant neoplasm of bone: Secondary | ICD-10-CM

## 2016-04-04 MED ORDER — HYDROCODONE-ACETAMINOPHEN 7.5-325 MG PO TABS: 1.0000 | ORAL_TABLET | ORAL | Status: DC | PRN

## 2016-04-04 NOTE — Telephone Encounter (Addendum)
Informed pt Rx will be left for pick up between 0830 and 4PM. She voiced understanding.

## 2016-04-04 NOTE — Telephone Encounter (Signed)
Pt called for pain medication refill. Call when rx ready for pickup please.

## 2016-04-04 NOTE — Addendum Note (Signed)
Addended by: Brien Few on: 04/04/2016 06:06 PM   Modules accepted: Orders

## 2016-04-14 ENCOUNTER — Ambulatory Visit (HOSPITAL_BASED_OUTPATIENT_CLINIC_OR_DEPARTMENT_OTHER): Payer: Medicare Other | Admitting: Nurse Practitioner

## 2016-04-14 ENCOUNTER — Ambulatory Visit (HOSPITAL_BASED_OUTPATIENT_CLINIC_OR_DEPARTMENT_OTHER): Payer: Medicare Other

## 2016-04-14 ENCOUNTER — Telehealth: Payer: Self-pay | Admitting: Nurse Practitioner

## 2016-04-14 ENCOUNTER — Other Ambulatory Visit: Payer: Self-pay

## 2016-04-14 ENCOUNTER — Other Ambulatory Visit (HOSPITAL_BASED_OUTPATIENT_CLINIC_OR_DEPARTMENT_OTHER): Payer: Medicare Other

## 2016-04-14 VITALS — BP 125/89 | HR 80 | Temp 98.3°F | Resp 18 | Ht 65.0 in | Wt 166.6 lb

## 2016-04-14 DIAGNOSIS — C7951 Secondary malignant neoplasm of bone: Secondary | ICD-10-CM | POA: Diagnosis not present

## 2016-04-14 DIAGNOSIS — C50919 Malignant neoplasm of unspecified site of unspecified female breast: Secondary | ICD-10-CM

## 2016-04-14 DIAGNOSIS — C50911 Malignant neoplasm of unspecified site of right female breast: Secondary | ICD-10-CM

## 2016-04-14 DIAGNOSIS — R110 Nausea: Secondary | ICD-10-CM | POA: Diagnosis not present

## 2016-04-14 DIAGNOSIS — Z9889 Other specified postprocedural states: Secondary | ICD-10-CM

## 2016-04-14 LAB — COMPREHENSIVE METABOLIC PANEL
ALT: 36 U/L (ref 0–55)
AST: 35 U/L — ABNORMAL HIGH (ref 5–34)
Albumin: 4.2 g/dL (ref 3.5–5.0)
Alkaline Phosphatase: 257 U/L — ABNORMAL HIGH (ref 40–150)
Anion Gap: 10 mEq/L (ref 3–11)
BILIRUBIN TOTAL: 0.87 mg/dL (ref 0.20–1.20)
BUN: 12.4 mg/dL (ref 7.0–26.0)
CO2: 24 meq/L (ref 22–29)
Calcium: 9.6 mg/dL (ref 8.4–10.4)
Chloride: 105 mEq/L (ref 98–109)
Creatinine: 0.8 mg/dL (ref 0.6–1.1)
EGFR: 78 mL/min/{1.73_m2} — AB (ref >90)
GLUCOSE: 75 mg/dL (ref 70–140)
POTASSIUM: 4 meq/L (ref 3.5–5.1)
SODIUM: 140 meq/L (ref 136–145)
TOTAL PROTEIN: 7.8 g/dL (ref 6.4–8.3)

## 2016-04-14 LAB — CBC WITH DIFFERENTIAL/PLATELET
BASO%: 0.6 % (ref 0.0–2.0)
Basophils Absolute: 0 10*3/uL (ref 0.0–0.1)
EOS ABS: 0 10*3/uL (ref 0.0–0.5)
EOS%: 0.6 % (ref 0.0–7.0)
HCT: 34.1 % — ABNORMAL LOW (ref 34.8–46.6)
HGB: 11.4 g/dL — ABNORMAL LOW (ref 11.6–15.9)
LYMPH%: 17.1 % (ref 14.0–49.7)
MCH: 33.6 pg (ref 25.1–34.0)
MCHC: 33.4 g/dL (ref 31.5–36.0)
MCV: 100.6 fL (ref 79.5–101.0)
MONO#: 0.4 10*3/uL (ref 0.1–0.9)
MONO%: 8.2 % (ref 0.0–14.0)
NEUT%: 73.5 % (ref 38.4–76.8)
NEUTROS ABS: 3.5 10*3/uL (ref 1.5–6.5)
Platelets: 149 10*3/uL (ref 145–400)
RBC: 3.39 10*6/uL — AB (ref 3.70–5.45)
RDW: 18.4 % — ABNORMAL HIGH (ref 11.2–14.5)
WBC: 4.7 10*3/uL (ref 3.9–10.3)
lymph#: 0.8 10*3/uL — ABNORMAL LOW (ref 0.9–3.3)

## 2016-04-14 MED ORDER — SODIUM CHLORIDE 0.9 % IV SOLN
Freq: Once | INTRAVENOUS | Status: AC
  Administered 2016-04-14: 13:00:00 via INTRAVENOUS

## 2016-04-14 MED ORDER — ZOLEDRONIC ACID 4 MG/100ML IV SOLN: 4.0000 mg | Freq: Once | INTRAVENOUS | Status: DC

## 2016-04-14 MED ORDER — CAPECITABINE 500 MG PO TABS: 1500.0000 mg | ORAL_TABLET | Freq: Two times a day (BID) | ORAL | 0 refills | Status: DC

## 2016-04-14 MED ORDER — ZOLEDRONIC ACID 4 MG/100ML IV SOLN
4.0000 mg | Freq: Once | INTRAVENOUS | Status: AC
  Administered 2016-04-14: 4 mg via INTRAVENOUS
  Filled 2016-04-14: qty 100

## 2016-04-14 NOTE — Patient Instructions (Signed)
Zoledronic Acid injection (Hypercalcemia, Oncology) (Zometa) What is this medicine? ZOLEDRONIC ACID (ZOE le dron ik AS id) lowers the amount of calcium loss from bone. It is used to treat too much calcium in your blood from cancer. It is also used to prevent complications of cancer that has spread to the bone. This medicine may be used for other purposes; ask your health care provider or pharmacist if you have questions. What should I tell my health care provider before I take this medicine? They need to know if you have any of these conditions: -aspirin-sensitive asthma -cancer, especially if you are receiving medicines used to treat cancer -dental disease or wear dentures -infection -kidney disease -receiving corticosteroids like dexamethasone or prednisone -an unusual or allergic reaction to zoledronic acid, other medicines, foods, dyes, or preservatives -pregnant or trying to get pregnant -breast-feeding How should I use this medicine? This medicine is for infusion into a vein. It is given by a health care professional in a hospital or clinic setting. Talk to your pediatrician regarding the use of this medicine in children. Special care may be needed. Overdosage: If you think you have taken too much of this medicine contact a poison control center or emergency room at once. NOTE: This medicine is only for you. Do not share this medicine with others. What if I miss a dose? It is important not to miss your dose. Call your doctor or health care professional if you are unable to keep an appointment. What may interact with this medicine? -certain antibiotics given by injection -NSAIDs, medicines for pain and inflammation, like ibuprofen or naproxen -some diuretics like bumetanide, furosemide -teriparatide -thalidomide This list may not describe all possible interactions. Give your health care provider a list of all the medicines, herbs, non-prescription drugs, or dietary supplements you  use. Also tell them if you smoke, drink alcohol, or use illegal drugs. Some items may interact with your medicine. What should I watch for while using this medicine? Visit your doctor or health care professional for regular checkups. It may be some time before you see the benefit from this medicine. Do not stop taking your medicine unless your doctor tells you to. Your doctor may order blood tests or other tests to see how you are doing. Women should inform their doctor if they wish to become pregnant or think they might be pregnant. There is a potential for serious side effects to an unborn child. Talk to your health care professional or pharmacist for more information. You should make sure that you get enough calcium and vitamin D while you are taking this medicine. Discuss the foods you eat and the vitamins you take with your health care professional. Some people who take this medicine have severe bone, joint, and/or muscle pain. This medicine may also increase your risk for jaw problems or a broken thigh bone. Tell your doctor right away if you have severe pain in your jaw, bones, joints, or muscles. Tell your doctor if you have any pain that does not go away or that gets worse. Tell your dentist and dental surgeon that you are taking this medicine. You should not have major dental surgery while on this medicine. See your dentist to have a dental exam and fix any dental problems before starting this medicine. Take good care of your teeth while on this medicine. Make sure you see your dentist for regular follow-up appointments. What side effects may I notice from receiving this medicine? Side effects that you should report   to your doctor or health care professional as soon as possible: -allergic reactions like skin rash, itching or hives, swelling of the face, lips, or tongue -anxiety, confusion, or depression -breathing problems -changes in vision -eye pain -feeling faint or lightheaded,  falls -jaw pain, especially after dental work -mouth sores -muscle cramps, stiffness, or weakness -redness, blistering, peeling or loosening of the skin, including inside the mouth -trouble passing urine or change in the amount of urine Side effects that usually do not require medical attention (report to your doctor or health care professional if they continue or are bothersome): -bone, joint, or muscle pain -constipation -diarrhea -fever -hair loss -irritation at site where injected -loss of appetite -nausea, vomiting -stomach upset -trouble sleeping -trouble swallowing -weak or tired This list may not describe all possible side effects. Call your doctor for medical advice about side effects. You may report side effects to FDA at 1-800-FDA-1088. Where should I keep my medicine? This drug is given in a hospital or clinic and will not be stored at home. NOTE: This sheet is a summary. It may not cover all possible information. If you have questions about this medicine, talk to your doctor, pharmacist, or health care provider.    2016, Elsevier/Gold Standard. (2014-02-01 14:19:39)  

## 2016-04-14 NOTE — Telephone Encounter (Signed)
per pof to sch pt appt-gave pt copy of avs °

## 2016-04-14 NOTE — Progress Notes (Addendum)
[place] OFFICE PROGRESS NOTE   Diagnosis:  Breast cancer  INTERVAL HISTORY:   Ms. Obremski returns as scheduled. She continues Xeloda 1 week on/1 week off. She has had a few episodes of mild nausea. She has also had a few episodes where her "head doesn't feel right". Some dizziness. No unusual headaches. No vision change. No falls. No focal weakness. She denies mouth sores. No diarrhea. No hand or foot pain or redness.  Objective:  Vital signs in last 24 hours:  Blood pressure 125/89, pulse 80, temperature 98.3 F (36.8 C), temperature source Oral, resp. rate 18, height _0  (1.651 m), weight 166 lb 9.6 oz (75.6 kg), last menstrual period 10/08/2014, SpO2 100 %.    HEENT: No thrush or ulcers. Resp: Lungs clear bilaterally. Cardio: Regular rate and rhythm. GI: Abdomen soft and nontender. No hepatomegaly. Vascular: No leg edema. Neuro: Alert and oriented. Motor strength 5 over 5. Finger to nose intact.  Skin: Mild erythematous rash sun exposed areas arms, legs and chest.    Lab Results:  Lab Results  Component Value Date   WBC 4.7 04/14/2016   HGB 11.4 (L) 04/14/2016   HCT 34.1 (L) 04/14/2016   MCV 100.6 04/14/2016   PLT 149 04/14/2016   NEUTROABS 3.5 04/14/2016    Imaging:  No results found.  Medications: I have reviewed the patient's current medications.  Assessment/Plan: 1. Multicentric invasive lobular carcinoma of the right breast diagnosed in December of 1999, a right mastectomy followed by adjuvant [facility] chemotherapy, 5 years of tamoxifen, and 5 years of Femara. The Femara was completed in June of 2010. The right breast mass excision in January 2000 was ER positive, PR positive, and HER-2 negative  2. Pain at the left groin area with a CT of the pelvis on 02/07/12 confirming a destructive lytic lesion at the left pubic symphysis, status post a CT-guided biopsy on 02/16/12 confirming metastatic carcinoma, focally ER positive .  -PET scan 03/07/2012  confirmed multiple hypermetabolic bone metastases with no other evidence of metastatic disease  -Status post palliative radiation to the pelvis beginning on 03/12/2012  -Initiation of Arimidex on 03/27/2012 -initiation of every three-month xgeva on 04/04/2012  -PET scan September 15 [id number] new hypermetabolic bone lesions  -Initiation of Faslodex 09/27/2012  -Restaging PET scan 04/08/2013 with evidence of disease progression in the bones  -Initiation of Aromasin/afinitor 05/29/2013  -Iliac biopsy at [facility] 05/22/2013 confirming HER-2 negative metastatic breast  -PET scan 11/18/2013 with a decrease in hypermetabolic activity  -PET scan 10/08/2014 with progressive diffuse hypermetabolic bone metastases -Initiation of Palbociclib 10/20/2014; Faslodex 10/23/2014. -Palbociclib placed on hold beginning 11/07/2014 due to neutropenia. -Palbociclib resumed 11/27/2014 on an every other day schedule -Palbociclib resumed 01/04/2015 at a reduced dose of 100 mg daily for 21 days. -Palbociclib beginning 02/12/2015 at a dose of 100 mg daily for 14 days on/14 days off. -PET scan 05/18/2015 with diffuse hypermetabolic bone lesions, increased metabolic activity compared to the PET scan from January 2016 -Palbociclib resumed 06/07/2015, Faslodex continued 06/12/2015 -PET scan 09/22/2015 with diffuse hypermetabolic bone lesions, increased metabolic activity compared to the PET scan 05/18/2015 -Continuation of palbociclib and Faslodex -palbociclib and Faslodex discontinued April 2017 secondary to a C7 spinous process fracture and hypercalcemia - Xeloda (7 days on/7 days off) started 02/28/2016 3. BRCA1 variant felt to be a polymorphism  4. Pain/tenderness at the left low anterolateral chest wall-she completed palliative radiation on 05/28/2012 with improvement in the pain. She was also treated with radiation to  the thoracic spine.  5. Anemia/leukopenia-likely secondary to breast cancer involving the  bone marrow and radiation /afinitor -the anemia is improved  6. History of mildly elevated liver enzymes-? Related to afinitor,? Secondary to metastatic breast cancer 7. Neutropenia secondary to Palbociclib. Improved. 8. Pain at the left shoulder and mid back-a plain x-ray of the left shoulder 06/12/2015 revealed a sclerotic density at the left scapula adjacent to the glenoid, there is metastatic disease involving the humeral head and left scapula on the PET scan 05/18/2015. She completed a course of radiation to the left shoulder on 08/05/2015. 9. Mild hypercalcemia-asymptomatic; Delton See 11/26/2015; persistent/progressive hypercalcemia 12/24/2015. She received Zometa 12/24/2015, 01/19/2016, 02/18/2016, and 03/17/2016 10. Low neck/upper back pain 12/24/2015 with MRI showing a mildly displaced acute-appearing C7 spinous process fracture; widespread osseous metastatic disease; no evidence of epidural tumor or spinal cord compression. - Completed palliative radiation to the cervical spine 02/17/2016 11. Dysphagia/odynophagia secondary to radiation-resolved   Disposition: Ms. Shannon appears stable. She will continue Xeloda 7 days on/7 days off. The rash in sun exposed areas is most likely related to Xeloda. She understands to limit sun exposure and use sunscreen.  She has had several episodes of mild nausea and dizziness. Her neurologic exam is normal. We discussed brain imaging. She would like to hold on this for now. If she has any more similar episodes or new neurologic type symptoms she will contact the office and we will proceed with imaging.  She will receive Zometa today. She will return for a follow-up visit and Zometa in 4 weeks. She will contact the office in the interim as outlined above or with any other problems.  Patient seen with Dr. Benay Spice.    Ned Card ANP/GNP-BC   04/14/2016  11:43 AM  This was a shared visit with Ned Card. Ms. Haydon will continue Xeloda. We will  obtain a brain CT if she has recurrent neurologic symptoms.  Julieanne Manson, M.D.

## 2016-04-15 ENCOUNTER — Encounter: Payer: Self-pay | Admitting: Nurse Practitioner

## 2016-04-19 ENCOUNTER — Other Ambulatory Visit: Payer: Self-pay | Admitting: *Deleted

## 2016-04-19 MED ORDER — CAPECITABINE 500 MG PO TABS: 1500.0000 mg | ORAL_TABLET | Freq: Two times a day (BID) | ORAL | 0 refills | Status: DC

## 2016-05-03 ENCOUNTER — Telehealth: Payer: Self-pay | Admitting: Pharmacist

## 2016-05-03 NOTE — Telephone Encounter (Signed)
Oral Chemotherapy Pharmacist Encounter   I spoke with patient for follow-up of Xeloda. Pt currently on week off. Patient has not missed any doses and is not having any issues obtaining her medication.  Pt is still experiencing rash which started on her legs and now includes her chest and tops of her hands. She is managing rash with Aquaphor lotion and this alleviated the itching. Sometimes they are open sores, but mostly raised red spots that itch very much when they start to come out. Pt states the rash is manageable at this time. Pt inquired if rash would eventually go away and pt was counseled that it likely would progress over time as it is doing.  Pt states that she has not had any additional episodes of "heavy-headedness" or dizziness since last OV.  She does experience nausea that usually starts on day 5 or 6 of her 7 days on and it is controlled by putting something on her stomach. She declined offer to have Dr. Benay Spice write a prescription for an anti-emetic.  Pt states that her appetite is ok, maybe even "too good."  Pt inquired about increase in Alk Phos from labs 7/28, I assured pt that Dr. Benay Spice is aware and we would be following, however it is a pretty non-specific marker that we would try to correlate with another issue if it continued to rise.  All questions answered. Appointments on 8/24 confirmed. Pt expressed understanding and gratitude. Pt knows to contact the office with any further questions or concerns.  Thank you,  Johny Drilling, PharmD, BCPS [facility]

## 2016-05-12 ENCOUNTER — Ambulatory Visit (HOSPITAL_BASED_OUTPATIENT_CLINIC_OR_DEPARTMENT_OTHER): Payer: 59 | Admitting: Oncology

## 2016-05-12 ENCOUNTER — Other Ambulatory Visit: Payer: Self-pay | Admitting: *Deleted

## 2016-05-12 ENCOUNTER — Ambulatory Visit (HOSPITAL_BASED_OUTPATIENT_CLINIC_OR_DEPARTMENT_OTHER): Payer: Medicare Other

## 2016-05-12 ENCOUNTER — Other Ambulatory Visit (HOSPITAL_BASED_OUTPATIENT_CLINIC_OR_DEPARTMENT_OTHER): Payer: Medicare Other

## 2016-05-12 VITALS — BP 117/61 | HR 78 | Temp 98.6°F | Resp 17 | Ht 65.0 in | Wt 170.6 lb

## 2016-05-12 DIAGNOSIS — C7951 Secondary malignant neoplasm of bone: Secondary | ICD-10-CM

## 2016-05-12 DIAGNOSIS — C50919 Malignant neoplasm of unspecified site of unspecified female breast: Secondary | ICD-10-CM

## 2016-05-12 DIAGNOSIS — C50911 Malignant neoplasm of unspecified site of right female breast: Secondary | ICD-10-CM | POA: Diagnosis not present

## 2016-05-12 DIAGNOSIS — Z9889 Other specified postprocedural states: Secondary | ICD-10-CM

## 2016-05-12 LAB — CBC WITH DIFFERENTIAL/PLATELET
BASO%: 0.6 % (ref 0.0–2.0)
Basophils Absolute: 0 10*3/uL (ref 0.0–0.1)
EOS ABS: 0 10*3/uL (ref 0.0–0.5)
EOS%: 0.9 % (ref 0.0–7.0)
HCT: 34.5 % — ABNORMAL LOW (ref 34.8–46.6)
HEMOGLOBIN: 11.5 g/dL — AB (ref 11.6–15.9)
LYMPH%: 18.7 % (ref 14.0–49.7)
MCH: 34.3 pg — ABNORMAL HIGH (ref 25.1–34.0)
MCHC: 33.3 g/dL (ref 31.5–36.0)
MCV: 103 fL — ABNORMAL HIGH (ref 79.5–101.0)
MONO#: 0.3 10*3/uL (ref 0.1–0.9)
MONO%: 7.4 % (ref 0.0–14.0)
NEUT#: 2.4 10*3/uL (ref 1.5–6.5)
NEUT%: 72.4 % (ref 38.4–76.8)
Platelets: 157 10*3/uL (ref 145–400)
RBC: 3.35 10*6/uL — AB (ref 3.70–5.45)
RDW: 18.4 % — AB (ref 11.2–14.5)
WBC: 3.4 10*3/uL — AB (ref 3.9–10.3)
lymph#: 0.6 10*3/uL — ABNORMAL LOW (ref 0.9–3.3)

## 2016-05-12 LAB — COMPREHENSIVE METABOLIC PANEL
ALBUMIN: 4.1 g/dL (ref 3.5–5.0)
ALK PHOS: 197 U/L — AB (ref 40–150)
ALT: 28 U/L (ref 0–55)
AST: 30 U/L (ref 5–34)
Anion Gap: 10 mEq/L (ref 3–11)
BILIRUBIN TOTAL: 0.75 mg/dL (ref 0.20–1.20)
BUN: 16.2 mg/dL (ref 7.0–26.0)
CO2: 24 meq/L (ref 22–29)
Calcium: 10.2 mg/dL (ref 8.4–10.4)
Chloride: 110 mEq/L — ABNORMAL HIGH (ref 98–109)
Creatinine: 0.8 mg/dL (ref 0.6–1.1)
EGFR: 83 mL/min/{1.73_m2} — ABNORMAL LOW (ref >90)
GLUCOSE: 107 mg/dL (ref 70–140)
Potassium: 3.8 mEq/L (ref 3.5–5.1)
SODIUM: 144 meq/L (ref 136–145)
TOTAL PROTEIN: 7.9 g/dL (ref 6.4–8.3)

## 2016-05-12 MED ORDER — CAPECITABINE 500 MG PO TABS: 1500.0000 mg | ORAL_TABLET | Freq: Two times a day (BID) | ORAL | 0 refills | Status: DC

## 2016-05-12 MED ORDER — SODIUM CHLORIDE 0.9 % IV SOLN
Freq: Once | INTRAVENOUS | Status: AC
  Administered 2016-05-12: 11:00:00 via INTRAVENOUS

## 2016-05-12 MED ORDER — ZOLEDRONIC ACID 4 MG/100ML IV SOLN
4.0000 mg | Freq: Once | INTRAVENOUS | Status: AC
  Administered 2016-05-12: 4 mg via INTRAVENOUS
  Filled 2016-05-12: qty 100

## 2016-05-12 MED ORDER — HYDROCODONE-ACETAMINOPHEN 7.5-325 MG PO TABS: 1.0000 | ORAL_TABLET | ORAL | 0 refills | Status: DC | PRN

## 2016-05-12 NOTE — Progress Notes (Signed)
[place] OFFICE PROGRESS NOTE   Diagnosis: Breast cancer  INTERVAL HISTORY:   Stacy Horn returns as scheduled. She continues Xeloda. She is taking Xeloda this week. She has a persistent erythematous rash over the arms and legs. The rash is now partially improved. The rash is intermittently pruritic. Her pain is much improved. Good appetite. No mouth sores, diarrhea, or hand/foot pain.  Objective:  Vital signs in last 24 hours:  Blood pressure 117/61, pulse 78, temperature 98.6 F (37 C), temperature source Oral, resp. rate 17, height '5\' 5"'$  (1.651 m), weight 170 lb 9.6 oz (77.4 kg), last menstrual period 10/08/2014, SpO2 99 %.    HEENT: No thrush or ulcers Resp: Lungs clear bilaterally Cardio: Regular rate and rhythm GI: No hepatomegaly Vascular: No leg edema Lymphatics: Pea-sized mobile left axillary node  Skin: Erythematous slightly raised rash over the arms and legs, palms and soles without erythema or skin breakdown     Lab Results:  Lab Results  Component Value Date   WBC 3.4 (L) 05/12/2016   HGB 11.5 (L) 05/12/2016   HCT 34.5 (L) 05/12/2016   MCV 103.0 (H) 05/12/2016   PLT 157 05/12/2016   NEUTROABS 2.4 05/12/2016   Creatinine 0.8, calcium 10.2, alkaline phosphatase 197  Medications: I have reviewed the patient's current medications.  Assessment/Plan:  Multicentric invasive lobular carcinoma of the right breast diagnosed in December of 1999, a right mastectomy followed by adjuvant [facility] chemotherapy, 5 years of tamoxifen, and 5 years of Femara. The Femara was completed in June of 2010. The right breast mass excision in January 2000 was ER positive, PR positive, and HER-2 negative  2. Pain at the left groin area with a CT of the pelvis on 02/07/12 confirming a destructive lytic lesion at the left pubic symphysis, status post a CT-guided biopsy on 02/16/12 confirming metastatic carcinoma, focally ER positive .  -PET scan 03/07/2012 confirmed multiple  hypermetabolic bone metastases with no other evidence of metastatic disease  -Status post palliative radiation to the pelvis beginning on 03/12/2012  -Initiation of Arimidex on 03/27/2012 -initiation of every three-month xgeva on 04/04/2012  -PET scan September 15 [id number] new hypermetabolic bone lesions  -Initiation of Faslodex 09/27/2012  -Restaging PET scan 04/08/2013 with evidence of disease progression in the bones  -Initiation of Aromasin/afinitor 05/29/2013  -Iliac biopsy at [facility] 05/22/2013 confirming HER-2 negative metastatic breast  -PET scan 11/18/2013 with a decrease in hypermetabolic activity  -PET scan 10/08/2014 with progressive diffuse hypermetabolic bone metastases -Initiation of Palbociclib 10/20/2014; Faslodex 10/23/2014. -Palbociclib placed on hold beginning 11/07/2014 due to neutropenia. -Palbociclib resumed 11/27/2014 on an every other day schedule -Palbociclib resumed 01/04/2015 at a reduced dose of 100 mg daily for 21 days. -Palbociclib beginning 02/12/2015 at a dose of 100 mg daily for 14 days on/14 days off. -PET scan 05/18/2015 with diffuse hypermetabolic bone lesions, increased metabolic activity compared to the PET scan from January 2016 -Palbociclib resumed 06/07/2015, Faslodex continued 06/12/2015 -PET scan 09/22/2015 with diffuse hypermetabolic bone lesions, increased metabolic activity compared to the PET scan 05/18/2015 -Continuation of palbociclib and Faslodex -palbociclib and Faslodex discontinued April 2017 secondary to a C7 spinous process fracture and hypercalcemia - Xeloda (7 days on/7 days off) started 02/28/2016 3. BRCA1 variant felt to be a polymorphism  4. Pain/tenderness at the left low anterolateral chest wall-she completed palliative radiation on 05/28/2012 with improvement in the pain. She was also treated with radiation to the thoracic spine.  5. Anemia/leukopenia-likely secondary to breast cancer involving the  bone marrow and  radiation /afinitor -the anemia is improved  6. History of mildly elevated liver enzymes-? Related to afinitor,? Secondary to metastatic breast cancer 7. Neutropenia secondary to Palbociclib. Improved. 8. Pain at the left shoulder and mid back-a plain x-ray of the left shoulder 06/12/2015 revealed a sclerotic density at the left scapula adjacent to the glenoid, there is metastatic disease involving the humeral head and left scapula on the PET scan 05/18/2015. She completed a course of radiation to the left shoulder on 08/05/2015. 9. Mild hypercalcemia-asymptomatic; Delton See 11/26/2015; persistent/progressive hypercalcemia 12/24/2015. She received Zometa 12/24/2015, 01/19/2016, 02/18/2016,  03/17/2016, and 04/14/2016 10. Low neck/upper back pain 12/24/2015 with MRI showing a mildly displaced acute-appearing C7 spinous process fracture; widespread osseous metastatic disease; no evidence of epidural tumor or spinal cord compression. - Completed palliative radiation to the cervical spine 02/17/2016 11. Dysphagia/odynophagia secondary to radiation-resolved 12. Xeloda-induced skin rash    Disposition:  She has been maintained on Xeloda for the past 2 months. Her pain has improved. The skin rash is mild at present. The plan is to continue Xeloda 7 days on/7 days off. She continues monthly Zometa.  Stacy Horn will contact us for worsening of the skin rash. She will return for an office visit and Zometa in one month.  The mildly elevated alkaline phosphatase may be related to toxicity from Xeloda or bone metastases.  [name], MD  05/12/2016  10:34 AM

## 2016-05-12 NOTE — Patient Instructions (Signed)

## 2016-05-18 ENCOUNTER — Telehealth: Payer: Self-pay | Admitting: Oncology

## 2016-05-18 ENCOUNTER — Telehealth: Payer: Self-pay | Admitting: *Deleted

## 2016-05-18 ENCOUNTER — Telehealth: Payer: Self-pay

## 2016-05-18 DIAGNOSIS — C50111 Malignant neoplasm of central portion of right female breast: Secondary | ICD-10-CM

## 2016-05-18 DIAGNOSIS — C7951 Secondary malignant neoplasm of bone: Secondary | ICD-10-CM

## 2016-05-18 MED ORDER — CAPECITABINE 500 MG PO TABS: 1500.0000 mg | ORAL_TABLET | Freq: Two times a day (BID) | ORAL | 0 refills | Status: DC

## 2016-05-18 NOTE — Telephone Encounter (Signed)
APPTS CONF WITH PATIENT PER 05/12/16 LOS.

## 2016-05-18 NOTE — Telephone Encounter (Signed)
"  I spoke with [facility] this morning and they have not received Xeloda refill.  I need to start this on Sunday.  I also need scheduling.  This nurse will send Xeloda order that was started on 05-12-2016.  Call transferred to ext [phone] for scheduling.

## 2016-05-18 NOTE — Telephone Encounter (Signed)
Faxed xeloda rx to [facility].

## 2016-06-09 ENCOUNTER — Other Ambulatory Visit: Payer: Self-pay | Admitting: *Deleted

## 2016-06-09 ENCOUNTER — Other Ambulatory Visit (HOSPITAL_BASED_OUTPATIENT_CLINIC_OR_DEPARTMENT_OTHER): Payer: Medicare Other

## 2016-06-09 ENCOUNTER — Ambulatory Visit (HOSPITAL_BASED_OUTPATIENT_CLINIC_OR_DEPARTMENT_OTHER): Payer: Medicare Other

## 2016-06-09 ENCOUNTER — Ambulatory Visit (HOSPITAL_BASED_OUTPATIENT_CLINIC_OR_DEPARTMENT_OTHER): Payer: 59 | Admitting: Oncology

## 2016-06-09 ENCOUNTER — Telehealth: Payer: Self-pay | Admitting: Oncology

## 2016-06-09 ENCOUNTER — Encounter: Payer: Self-pay | Admitting: Oncology

## 2016-06-09 VITALS — BP 111/60 | HR 71 | Temp 97.6°F | Resp 16 | Ht 65.0 in | Wt 169.8 lb

## 2016-06-09 DIAGNOSIS — C7951 Secondary malignant neoplasm of bone: Principal | ICD-10-CM

## 2016-06-09 DIAGNOSIS — C50919 Malignant neoplasm of unspecified site of unspecified female breast: Secondary | ICD-10-CM | POA: Diagnosis not present

## 2016-06-09 DIAGNOSIS — Z23 Encounter for immunization: Secondary | ICD-10-CM

## 2016-06-09 DIAGNOSIS — C50911 Malignant neoplasm of unspecified site of right female breast: Secondary | ICD-10-CM | POA: Diagnosis not present

## 2016-06-09 DIAGNOSIS — Z9889 Other specified postprocedural states: Secondary | ICD-10-CM

## 2016-06-09 DIAGNOSIS — C50111 Malignant neoplasm of central portion of right female breast: Secondary | ICD-10-CM

## 2016-06-09 LAB — COMPREHENSIVE METABOLIC PANEL
ALT: 29 U/L (ref 0–55)
ANION GAP: 11 meq/L (ref 3–11)
AST: 29 U/L (ref 5–34)
Albumin: 4.1 g/dL (ref 3.5–5.0)
Alkaline Phosphatase: 146 U/L (ref 40–150)
BUN: 19.3 mg/dL (ref 7.0–26.0)
CALCIUM: 10 mg/dL (ref 8.4–10.4)
CHLORIDE: 106 meq/L (ref 98–109)
CO2: 25 mEq/L (ref 22–29)
Creatinine: 0.8 mg/dL (ref 0.6–1.1)
EGFR: 78 mL/min/{1.73_m2} — AB (ref >90)
Glucose: 137 mg/dl (ref 70–140)
POTASSIUM: 4.1 meq/L (ref 3.5–5.1)
Sodium: 142 mEq/L (ref 136–145)
Total Bilirubin: 0.81 mg/dL (ref 0.20–1.20)
Total Protein: 7.9 g/dL (ref 6.4–8.3)

## 2016-06-09 LAB — CBC WITH DIFFERENTIAL/PLATELET
BASO%: 0.3 % (ref 0.0–2.0)
Basophils Absolute: 0 10*3/uL (ref 0.0–0.1)
EOS%: 0.9 % (ref 0.0–7.0)
Eosinophils Absolute: 0 10*3/uL (ref 0.0–0.5)
HEMATOCRIT: 35.7 % (ref 34.8–46.6)
HGB: 12 g/dL (ref 11.6–15.9)
LYMPH#: 0.6 10*3/uL — AB (ref 0.9–3.3)
LYMPH%: 16.6 % (ref 14.0–49.7)
MCH: 35.3 pg — AB (ref 25.1–34.0)
MCHC: 33.7 g/dL (ref 31.5–36.0)
MCV: 104.8 fL — AB (ref 79.5–101.0)
MONO#: 0.2 10*3/uL (ref 0.1–0.9)
MONO%: 5.4 % (ref 0.0–14.0)
NEUT%: 76.8 % (ref 38.4–76.8)
NEUTROS ABS: 2.7 10*3/uL (ref 1.5–6.5)
Platelets: 175 10*3/uL (ref 145–400)
RBC: 3.41 10*6/uL — ABNORMAL LOW (ref 3.70–5.45)
RDW: 18.9 % — ABNORMAL HIGH (ref 11.2–14.5)
WBC: 3.5 10*3/uL — AB (ref 3.9–10.3)

## 2016-06-09 MED ORDER — ZOLEDRONIC ACID 4 MG/100ML IV SOLN
4.0000 mg | Freq: Once | INTRAVENOUS | Status: AC
  Administered 2016-06-09: 4 mg via INTRAVENOUS
  Filled 2016-06-09: qty 100

## 2016-06-09 MED ORDER — CAPECITABINE 500 MG PO TABS: 1500.0000 mg | ORAL_TABLET | Freq: Two times a day (BID) | ORAL | 0 refills | Status: DC

## 2016-06-09 MED ORDER — INFLUENZA VAC SPLIT QUAD 0.5 ML IM SUSY
0.5000 mL | PREFILLED_SYRINGE | Freq: Once | INTRAMUSCULAR | Status: AC
  Administered 2016-06-09: 0.5 mL via INTRAMUSCULAR
  Filled 2016-06-09: qty 0.5

## 2016-06-09 MED ORDER — HYDROCODONE-ACETAMINOPHEN 7.5-325 MG PO TABS: 1.0000 | ORAL_TABLET | ORAL | 0 refills | Status: DC | PRN

## 2016-06-09 MED ORDER — SODIUM CHLORIDE 0.9 % IV SOLN
Freq: Once | INTRAVENOUS | Status: AC
  Administered 2016-06-09: 11:00:00 via INTRAVENOUS

## 2016-06-09 NOTE — Telephone Encounter (Signed)
Appointments complete per 9/21 los. Patient aware and will get print out in infusion area.

## 2016-06-09 NOTE — Patient Instructions (Signed)
Zoledronic Acid injection (Hypercalcemia, Oncology) (Zometa) What is this medicine? ZOLEDRONIC ACID (ZOE le dron ik AS id) lowers the amount of calcium loss from bone. It is used to treat too much calcium in your blood from cancer. It is also used to prevent complications of cancer that has spread to the bone. This medicine may be used for other purposes; ask your health care provider or pharmacist if you have questions. What should I tell my health care provider before I take this medicine? They need to know if you have any of these conditions: -aspirin-sensitive asthma -cancer, especially if you are receiving medicines used to treat cancer -dental disease or wear dentures -infection -kidney disease -receiving corticosteroids like dexamethasone or prednisone -an unusual or allergic reaction to zoledronic acid, other medicines, foods, dyes, or preservatives -pregnant or trying to get pregnant -breast-feeding How should I use this medicine? This medicine is for infusion into a vein. It is given by a health care professional in a hospital or clinic setting. Talk to your pediatrician regarding the use of this medicine in children. Special care may be needed. Overdosage: If you think you have taken too much of this medicine contact a poison control center or emergency room at once. NOTE: This medicine is only for you. Do not share this medicine with others. What if I miss a dose? It is important not to miss your dose. Call your doctor or health care professional if you are unable to keep an appointment. What may interact with this medicine? -certain antibiotics given by injection -NSAIDs, medicines for pain and inflammation, like ibuprofen or naproxen -some diuretics like bumetanide, furosemide -teriparatide -thalidomide This list may not describe all possible interactions. Give your health care provider a list of all the medicines, herbs, non-prescription drugs, or dietary supplements you  use. Also tell them if you smoke, drink alcohol, or use illegal drugs. Some items may interact with your medicine. What should I watch for while using this medicine? Visit your doctor or health care professional for regular checkups. It may be some time before you see the benefit from this medicine. Do not stop taking your medicine unless your doctor tells you to. Your doctor may order blood tests or other tests to see how you are doing. Women should inform their doctor if they wish to become pregnant or think they might be pregnant. There is a potential for serious side effects to an unborn child. Talk to your health care professional or pharmacist for more information. You should make sure that you get enough calcium and vitamin D while you are taking this medicine. Discuss the foods you eat and the vitamins you take with your health care professional. Some people who take this medicine have severe bone, joint, and/or muscle pain. This medicine may also increase your risk for jaw problems or a broken thigh bone. Tell your doctor right away if you have severe pain in your jaw, bones, joints, or muscles. Tell your doctor if you have any pain that does not go away or that gets worse. Tell your dentist and dental surgeon that you are taking this medicine. You should not have major dental surgery while on this medicine. See your dentist to have a dental exam and fix any dental problems before starting this medicine. Take good care of your teeth while on this medicine. Make sure you see your dentist for regular follow-up appointments. What side effects may I notice from receiving this medicine? Side effects that you should report   to your doctor or health care professional as soon as possible: -allergic reactions like skin rash, itching or hives, swelling of the face, lips, or tongue -anxiety, confusion, or depression -breathing problems -changes in vision -eye pain -feeling faint or lightheaded,  falls -jaw pain, especially after dental work -mouth sores -muscle cramps, stiffness, or weakness -redness, blistering, peeling or loosening of the skin, including inside the mouth -trouble passing urine or change in the amount of urine Side effects that usually do not require medical attention (report to your doctor or health care professional if they continue or are bothersome): -bone, joint, or muscle pain -constipation -diarrhea -fever -hair loss -irritation at site where injected -loss of appetite -nausea, vomiting -stomach upset -trouble sleeping -trouble swallowing -weak or tired This list may not describe all possible side effects. Call your doctor for medical advice about side effects. You may report side effects to FDA at 1-800-FDA-1088. Where should I keep my medicine? This drug is given in a hospital or clinic and will not be stored at home. NOTE: This sheet is a summary. It may not cover all possible information. If you have questions about this medicine, talk to your doctor, pharmacist, or health care provider.    2016, Elsevier/Gold Standard. (2014-02-01 14:19:39)  

## 2016-06-09 NOTE — Progress Notes (Signed)
[facility] OFFICE PROGRESS NOTE   Diagnosis: Breast cancer  INTERVAL HISTORY:   Stacy Horn returns as scheduled. She began the current week of Xeloda on 06/05/2016. No mouth sores, diarrhea, or hand/foot pain. She continues to have an erythematous rash at the lower legs. Her pain has improved in general. She reports a nerve-type sensation radiating toward the pelvis when she flexes the neck. No numbness or weakness. She had an episode of acute back pain that she relates to lumbar disc disease  Objective:  Vital signs in last 24 hours:  Blood pressure 111/60, pulse 71, temperature 97.6 F (36.4 C), temperature source Oral, resp. rate 16, height '5\' 5"'  (1.651 m), weight 169 lb 12.8 oz (77 kg), last menstrual period 10/08/2014, SpO2 100 %.    HEENT: No thrush or ulcers Resp: Lungs clear bilaterally Cardio: Regular rate and rhythm GI: No hepatosplenomegaly Vascular: No leg edema Musculoskeletal: No neck tenderness  Skin: Erythematous maculopapular lesions at the lower legs   Calcium 10.0, albumin 4.1, creatinine 0.8 Lab Results:  Lab Results  Component Value Date   WBC 3.5 (L) 06/09/2016   HGB 12.0 06/09/2016   HCT 35.7 06/09/2016   MCV 104.8 (H) 06/09/2016   PLT 175 06/09/2016   NEUTROABS 2.7 06/09/2016     Medications: I have reviewed the patient's current medications.  Assessment/Plan: 1. Multicentric invasive lobular carcinoma of the right breast diagnosed in December of 1999, a right mastectomy followed by adjuvant [facility] chemotherapy, 5 years of tamoxifen, and 5 years of Femara. The Femara was completed in June of 2010. The right breast mass excision in January 2000 was ER positive, PR positive, and HER-2 negative  2. Pain at the left groin area with a CT of the pelvis on 02/07/12 confirming a destructive lytic lesion at the left pubic symphysis, status post a CT-guided biopsy on 02/16/12 confirming metastatic carcinoma, focally ER positive .  -PET scan  03/07/2012 confirmed multiple hypermetabolic bone metastases with no other evidence of metastatic disease  -Status post palliative radiation to the pelvis beginning on 03/12/2012  -Initiation of Arimidex on 03/27/2012 -initiation of every three-month xgeva on 04/04/2012  -PET scan September 14 [id number] new hypermetabolic bone lesions  -Initiation of Faslodex 09/27/2012  -Restaging PET scan 04/08/2013 with evidence of disease progression in the bones  -Initiation of Aromasin/afinitor 05/29/2013  -Iliac biopsy at [facility] 05/22/2013 confirming HER-2 negative metastatic breast  -PET scan 11/18/2013 with a decrease in hypermetabolic activity  -PET scan 10/08/2014 with progressive diffuse hypermetabolic bone metastases -Initiation of Palbociclib 10/20/2014; Faslodex 10/23/2014. -Palbociclib placed on hold beginning 11/07/2014 due to neutropenia. -Palbociclib resumed 11/27/2014 on an every other day schedule -Palbociclib resumed 01/04/2015 at a reduced dose of 100 mg daily for 21 days. -Palbociclib beginning 02/12/2015 at a dose of 100 mg daily for 14 days on/14 days off. -PET scan 05/18/2015 with diffuse hypermetabolic bone lesions, increased metabolic activity compared to the PET scan from January 2016 -Palbociclib resumed 06/07/2015, Faslodex continued 06/12/2015 -PET scan 09/22/2015 with diffuse hypermetabolic bone lesions, increased metabolic activity compared to the PET scan 05/18/2015 -Continuation of palbociclib and Faslodex -palbociclib and Faslodex discontinued April 2017 secondary to a C7 spinous process fracture and hypercalcemia - Xeloda (7 days on/7 days off) started 02/28/2016 3. BRCA1 variant felt to be a polymorphism  4. Pain/tenderness at the left low anterolateral chest wall-she completed palliative radiation on 05/28/2012 with improvement in the pain. She was also treated with radiation to the thoracic spine.  5. Anemia/leukopenia-likely secondary  to breast cancer  involving the bone marrow and radiation /afinitor -the anemia is improved  6. History of mildly elevated liver enzymes-? Related to afinitor,? Secondary to metastatic breast cancer 7. Neutropenia secondary to Palbociclib. Improved. 8. Pain at the left shoulder and mid back-a plain x-ray of the left shoulder 06/12/2015 revealed a sclerotic density at the left scapula adjacent to the glenoid, there is metastatic disease involving the humeral head and left scapula on the PET scan 05/18/2015. She completed a course of radiation to the left shoulder on 08/05/2015. 9. Mild hypercalcemia-asymptomatic; Delton See 11/26/2015; persistent/progressive hypercalcemia 12/24/2015. She received Zometa 12/24/2015, 01/19/2016, 02/18/2016,  03/17/2016, and 04/14/2016 10. Low neck/upper back pain 12/24/2015 with MRI showing a mildly displaced acute-appearing C7 spinous process fracture; widespread osseous metastatic disease; no evidence of epidural tumor or spinal cord compression. - Completed palliative radiation to the cervical spine 02/17/2016 11. Dysphagia/odynophagia secondary to radiation-resolved 12. Xeloda-induced skin rash     Disposition:  She has been maintained on Xeloda for the past 3 months. Her pain has improved and anemia is better. She will continue Xeloda on the current schedule. She will receive Zometa and an influenza vaccine today.  I suspect the sensation she has with flexing the neck is related to spine radiation. She will contact us for new neurologic symptoms.  Stacy Horn will return for an office visit in one month.  [name], MD  06/09/2016  10:22 AM

## 2016-07-05 ENCOUNTER — Telehealth: Payer: Self-pay | Admitting: *Deleted

## 2016-07-05 NOTE — Telephone Encounter (Signed)
"  I received an appointment reminder and didn;t get to press the correct button to confirm is why I'm calling.  I will be tere at 10:15 Thursday."

## 2016-07-07 ENCOUNTER — Ambulatory Visit (HOSPITAL_BASED_OUTPATIENT_CLINIC_OR_DEPARTMENT_OTHER): Payer: 59 | Admitting: Nurse Practitioner

## 2016-07-07 ENCOUNTER — Telehealth: Payer: Self-pay | Admitting: Oncology

## 2016-07-07 ENCOUNTER — Other Ambulatory Visit (HOSPITAL_BASED_OUTPATIENT_CLINIC_OR_DEPARTMENT_OTHER): Payer: Medicare Other

## 2016-07-07 ENCOUNTER — Ambulatory Visit (HOSPITAL_BASED_OUTPATIENT_CLINIC_OR_DEPARTMENT_OTHER): Payer: Medicare Other

## 2016-07-07 ENCOUNTER — Other Ambulatory Visit: Payer: Self-pay

## 2016-07-07 VITALS — BP 117/78 | HR 64 | Temp 97.8°F | Resp 18 | Ht 65.0 in | Wt 174.8 lb

## 2016-07-07 DIAGNOSIS — C7951 Secondary malignant neoplasm of bone: Secondary | ICD-10-CM

## 2016-07-07 DIAGNOSIS — C50911 Malignant neoplasm of unspecified site of right female breast: Secondary | ICD-10-CM

## 2016-07-07 DIAGNOSIS — C50919 Malignant neoplasm of unspecified site of unspecified female breast: Secondary | ICD-10-CM

## 2016-07-07 DIAGNOSIS — Z9889 Other specified postprocedural states: Secondary | ICD-10-CM

## 2016-07-07 LAB — COMPREHENSIVE METABOLIC PANEL
ALBUMIN: 4 g/dL (ref 3.5–5.0)
ALK PHOS: 112 U/L (ref 40–150)
ALT: 32 U/L (ref 0–55)
ANION GAP: 10 meq/L (ref 3–11)
AST: 42 U/L — ABNORMAL HIGH (ref 5–34)
BUN: 17 mg/dL (ref 7.0–26.0)
CO2: 23 mEq/L (ref 22–29)
CREATININE: 0.7 mg/dL (ref 0.6–1.1)
Calcium: 9.6 mg/dL (ref 8.4–10.4)
Chloride: 106 mEq/L (ref 98–109)
EGFR: 88 mL/min/{1.73_m2} — AB (ref >90)
GLUCOSE: 81 mg/dL (ref 70–140)
Potassium: 4.8 mEq/L (ref 3.5–5.1)
Sodium: 139 mEq/L (ref 136–145)
TOTAL PROTEIN: 7.4 g/dL (ref 6.4–8.3)
Total Bilirubin: 0.85 mg/dL (ref 0.20–1.20)

## 2016-07-07 LAB — CBC WITH DIFFERENTIAL/PLATELET
BASO%: 0.4 % (ref 0.0–2.0)
Basophils Absolute: 0 10*3/uL (ref 0.0–0.1)
EOS ABS: 0.1 10*3/uL (ref 0.0–0.5)
EOS%: 1.5 % (ref 0.0–7.0)
HCT: 35.8 % (ref 34.8–46.6)
HEMOGLOBIN: 11.8 g/dL (ref 11.6–15.9)
LYMPH#: 0.6 10*3/uL — AB (ref 0.9–3.3)
LYMPH%: 18.6 % (ref 14.0–49.7)
MCH: 35 pg — ABNORMAL HIGH (ref 25.1–34.0)
MCHC: 33.1 g/dL (ref 31.5–36.0)
MCV: 105.8 fL — AB (ref 79.5–101.0)
MONO#: 0.3 10*3/uL (ref 0.1–0.9)
MONO%: 7.9 % (ref 0.0–14.0)
NEUT%: 71.6 % (ref 38.4–76.8)
NEUTROS ABS: 2.4 10*3/uL (ref 1.5–6.5)
PLATELETS: 170 10*3/uL (ref 145–400)
RBC: 3.38 10*6/uL — ABNORMAL LOW (ref 3.70–5.45)
RDW: 17.9 % — AB (ref 11.2–14.5)
WBC: 3.4 10*3/uL — AB (ref 3.9–10.3)

## 2016-07-07 MED ORDER — HYDROCODONE-ACETAMINOPHEN 7.5-325 MG PO TABS: 1.0000 | ORAL_TABLET | ORAL | 0 refills | Status: DC | PRN

## 2016-07-07 MED ORDER — SODIUM CHLORIDE 0.9 % IV SOLN
Freq: Once | INTRAVENOUS | Status: AC
  Administered 2016-07-07: 12:00:00 via INTRAVENOUS

## 2016-07-07 MED ORDER — ZOLEDRONIC ACID 4 MG/100ML IV SOLN
4.0000 mg | Freq: Once | INTRAVENOUS | Status: AC
  Administered 2016-07-07: 4 mg via INTRAVENOUS
  Filled 2016-07-07: qty 100

## 2016-07-07 MED ORDER — CAPECITABINE 500 MG PO TABS: 1500.0000 mg | ORAL_TABLET | Freq: Two times a day (BID) | ORAL | 0 refills | Status: DC

## 2016-07-07 NOTE — Telephone Encounter (Signed)
Appointments complete per 10/19 los. Patient to get print out  In infusion area.

## 2016-07-07 NOTE — Progress Notes (Signed)
[facility] OFFICE PROGRESS NOTE   Diagnosis:  Breast cancer  INTERVAL HISTORY:   [name] returns as scheduled. She continues Xeloda 1 week on/1 week off. She denies nausea/vomiting. No mouth sores. No diarrhea. No hand or foot pain or redness. She continues to have a rash at the lower legs. Pain overall continues to be improved. She takes hydrocodone as needed. She continues to have a nerve-type sensation radiating toward the pelvis when she flexes her neck. This is occurring less often.  Objective:  Vital signs in last 24 hours:  Blood pressure 117/78, pulse 64, temperature 97.8 F (36.6 C), temperature source Oral, resp. rate 18, height '5\' 5"'  (1.651 m), weight 174 lb 12.8 oz (79.3 kg), last menstrual period 10/08/2014, SpO2 100 %.    HEENT: No thrush or ulcers. Lymphatics: 1 cm left axillary lymph node. Resp: Lungs clear bilaterally. Cardio: Regular rate and rhythm. GI: Abdomen soft and nontender. No hepatomegaly. Vascular: No leg edema. Skin: Erythematous maculopapular lesions at the lower legs.    Lab Results:  Lab Results  Component Value Date   WBC 3.4 (L) 07/07/2016   HGB 11.8 07/07/2016   HCT 35.8 07/07/2016   MCV 105.8 (H) 07/07/2016   PLT 170 07/07/2016   NEUTROABS 2.4 07/07/2016    Imaging:  No results found.  Medications: I have reviewed the patient's current medications.  Assessment/Plan: 1. Multicentric invasive lobular carcinoma of the right breast diagnosed in December of 1999, a right mastectomy followed by adjuvant [facility] chemotherapy, 5 years of tamoxifen, and 5 years of Femara. The Femara was completed in June of 2010. The right breast mass excision in January 2000 was ER positive, PR positive, and HER-2 negative  2. Pain at the left groin area with a CT of the pelvis on 02/07/12 confirming a destructive lytic lesion at the left pubic symphysis, status post a CT-guided biopsy on 02/16/12 confirming metastatic carcinoma, focally ER  positive .  -PET scan 03/07/2012 confirmed multiple hypermetabolic bone metastases with no other evidence of metastatic disease  -Status post palliative radiation to the pelvis beginning on 03/12/2012  -Initiation of Arimidex on 03/27/2012 -initiation of every three-month xgeva on 04/04/2012  -PET scan September 15 [id number] new hypermetabolic bone lesions  -Initiation of Faslodex 09/27/2012  -Restaging PET scan 04/08/2013 with evidence of disease progression in the bones  -Initiation of Aromasin/afinitor 05/29/2013  -Iliac biopsy at [facility] 05/22/2013 confirming HER-2 negative metastatic breast  -PET scan 11/18/2013 with a decrease in hypermetabolic activity  -PET scan 10/08/2014 with progressive diffuse hypermetabolic bone metastases -Initiation of Palbociclib 10/20/2014; Faslodex 10/23/2014. -Palbociclib placed on hold beginning 11/07/2014 due to neutropenia. -Palbociclib resumed 11/27/2014 on an every other day schedule -Palbociclib resumed 01/04/2015 at a reduced dose of 100 mg daily for 21 days. -Palbociclib beginning 02/12/2015 at a dose of 100 mg daily for 14 days on/14 days off. -PET scan 05/18/2015 with diffuse hypermetabolic bone lesions, increased metabolic activity compared to the PET scan from January 2016 -Palbociclib resumed 06/07/2015, Faslodex continued 06/12/2015 -PET scan 09/22/2015 with diffuse hypermetabolic bone lesions, increased metabolic activity compared to the PET scan 05/18/2015 -Continuation of palbociclib and Faslodex -palbociclib and Faslodex discontinued April 2017 secondary to a C7 spinous process fracture and hypercalcemia - Xeloda (7 days on/7 days off) started 02/28/2016 3. BRCA1 variant felt to be a polymorphism  4. Pain/tenderness at the left low anterolateral chest wall-she completed palliative radiation on 05/28/2012 with improvement in the pain. She was also treated with radiation to the  thoracic spine.  5. Anemia/leukopenia-likely  secondary to breast cancer involving the bone marrow and radiation /afinitor -the anemia is improved  6. History of mildly elevated liver enzymes-? Related to afinitor,? Secondary to metastatic breast cancer 7. Neutropenia secondary to Palbociclib. Improved. 8. Pain at the left shoulder and mid back-a plain x-ray of the left shoulder 06/12/2015 revealed a sclerotic density at the left scapula adjacent to the glenoid, there is metastatic disease involving the humeral head and left scapula on the PET scan 05/18/2015. She completed a course of radiation to the left shoulder on 08/05/2015. 9. Mild hypercalcemia-asymptomatic; Delton See 11/26/2015; persistent/progressive hypercalcemia 12/24/2015. She received Zometa 12/24/2015, 01/19/2016, 02/18/2016, 03/17/2016, and 04/14/2016 10. Low neck/upper back pain 12/24/2015 with MRI showing a mildly displaced acute-appearing C7 spinous process fracture; widespread osseous metastatic disease; no evidence of epidural tumor or spinal cord compression. - Completed palliative radiation to the cervical spine 02/17/2016 11. Dysphagia/odynophagia secondary to radiation-resolved 12. Xeloda-induced skin rash   Disposition: [name] appears stable. She will continue Xeloda 7 days on/7 days off. She will receive Zometa today. The plan is for a restaging PET scan after she has been on Xeloda for 6 months.  She will return for a follow-up visit and Zometa in one month. She will contact the office in the interim with any problems.  Plan reviewed with Dr. Benay Spice.    Ned Card ANP/GNP-BC   07/07/2016  11:35 AM

## 2016-07-15 ENCOUNTER — Telehealth: Payer: Self-pay | Admitting: *Deleted

## 2016-07-15 DIAGNOSIS — C7951 Secondary malignant neoplasm of bone: Secondary | ICD-10-CM

## 2016-07-15 MED ORDER — CAPECITABINE 500 MG PO TABS: 1500.0000 mg | ORAL_TABLET | Freq: Two times a day (BID) | ORAL | 0 refills | Status: DC

## 2016-07-15 NOTE — Telephone Encounter (Signed)
Patient stated that she's  unable to receive her XELODA 500 mg due to the fact that when info was sent to them it didn't have the quantity. She stated that she needs for someone to contact [facility] today so that she can get it shipped overnight.

## 2016-07-15 NOTE — Telephone Encounter (Signed)
Called [facility] to follow up on Xeloda Rx: Spoke with [place]. They needed to clarify cycling information. Confirmed pt takes 3 tabs BID on days 1-7 and 15-21 EVERY 28 DAYS. They will ship medication today. Per Minette Brine, [facility] had made 2 attempts to contact us for clarification using managed care phone # [phone]. Updated office phone and fax information. (Supplied main office number and triage fax for refills.)   Notified pt that prescription was clarified with pharmacy.

## 2016-08-04 ENCOUNTER — Ambulatory Visit (HOSPITAL_BASED_OUTPATIENT_CLINIC_OR_DEPARTMENT_OTHER): Payer: Medicare Other

## 2016-08-04 ENCOUNTER — Other Ambulatory Visit (HOSPITAL_BASED_OUTPATIENT_CLINIC_OR_DEPARTMENT_OTHER): Payer: Medicare Other

## 2016-08-04 ENCOUNTER — Telehealth: Payer: Self-pay | Admitting: Oncology

## 2016-08-04 ENCOUNTER — Ambulatory Visit (HOSPITAL_BASED_OUTPATIENT_CLINIC_OR_DEPARTMENT_OTHER): Payer: Medicare Other | Admitting: Oncology

## 2016-08-04 VITALS — BP 121/64 | HR 66 | Temp 97.7°F | Resp 18 | Wt 173.1 lb

## 2016-08-04 DIAGNOSIS — C50919 Malignant neoplasm of unspecified site of unspecified female breast: Secondary | ICD-10-CM

## 2016-08-04 DIAGNOSIS — C50911 Malignant neoplasm of unspecified site of right female breast: Secondary | ICD-10-CM | POA: Diagnosis not present

## 2016-08-04 DIAGNOSIS — C7951 Secondary malignant neoplasm of bone: Secondary | ICD-10-CM

## 2016-08-04 DIAGNOSIS — Z9889 Other specified postprocedural states: Secondary | ICD-10-CM

## 2016-08-04 DIAGNOSIS — J3489 Other specified disorders of nose and nasal sinuses: Secondary | ICD-10-CM | POA: Diagnosis not present

## 2016-08-04 DIAGNOSIS — J069 Acute upper respiratory infection, unspecified: Secondary | ICD-10-CM | POA: Diagnosis not present

## 2016-08-04 LAB — CBC WITH DIFFERENTIAL/PLATELET
BASO%: 0.3 % (ref 0.0–2.0)
BASOS ABS: 0 10*3/uL (ref 0.0–0.1)
EOS%: 1 % (ref 0.0–7.0)
Eosinophils Absolute: 0 10*3/uL (ref 0.0–0.5)
HCT: 38.9 % (ref 34.8–46.6)
HEMOGLOBIN: 13.2 g/dL (ref 11.6–15.9)
LYMPH%: 24 % (ref 14.0–49.7)
MCH: 35.5 pg — AB (ref 25.1–34.0)
MCHC: 33.9 g/dL (ref 31.5–36.0)
MCV: 104.6 fL — ABNORMAL HIGH (ref 79.5–101.0)
MONO#: 0.3 10*3/uL (ref 0.1–0.9)
MONO%: 9.4 % (ref 0.0–14.0)
NEUT#: 2 10*3/uL (ref 1.5–6.5)
NEUT%: 65.3 % (ref 38.4–76.8)
Platelets: 173 10*3/uL (ref 145–400)
RBC: 3.72 10*6/uL (ref 3.70–5.45)
RDW: 16 % — AB (ref 11.2–14.5)
WBC: 3.1 10*3/uL — ABNORMAL LOW (ref 3.9–10.3)
lymph#: 0.7 10*3/uL — ABNORMAL LOW (ref 0.9–3.3)

## 2016-08-04 LAB — COMPREHENSIVE METABOLIC PANEL
ALT: 35 U/L (ref 0–55)
AST: 34 U/L (ref 5–34)
Albumin: 4.2 g/dL (ref 3.5–5.0)
Alkaline Phosphatase: 105 U/L (ref 40–150)
Anion Gap: 11 mEq/L (ref 3–11)
BUN: 17 mg/dL (ref 7.0–26.0)
CHLORIDE: 106 meq/L (ref 98–109)
CO2: 24 mEq/L (ref 22–29)
Calcium: 10 mg/dL (ref 8.4–10.4)
Creatinine: 0.7 mg/dL (ref 0.6–1.1)
EGFR: 90 mL/min/{1.73_m2} — AB (ref >90)
GLUCOSE: 91 mg/dL (ref 70–140)
POTASSIUM: 4 meq/L (ref 3.5–5.1)
SODIUM: 141 meq/L (ref 136–145)
Total Bilirubin: 0.81 mg/dL (ref 0.20–1.20)
Total Protein: 7.8 g/dL (ref 6.4–8.3)

## 2016-08-04 MED ORDER — HYDROCODONE-ACETAMINOPHEN 7.5-325 MG PO TABS: 1.0000 | ORAL_TABLET | ORAL | 0 refills | Status: DC | PRN

## 2016-08-04 MED ORDER — CAPECITABINE 500 MG PO TABS: 1500.0000 mg | ORAL_TABLET | Freq: Two times a day (BID) | ORAL | 0 refills | Status: DC

## 2016-08-04 MED ORDER — ZOLEDRONIC ACID 4 MG/100ML IV SOLN
4.0000 mg | Freq: Once | INTRAVENOUS | Status: AC
  Administered 2016-08-04: 4 mg via INTRAVENOUS
  Filled 2016-08-04: qty 100

## 2016-08-04 MED ORDER — SODIUM CHLORIDE 0.9 % IV SOLN
Freq: Once | INTRAVENOUS | Status: AC
  Administered 2016-08-04: 11:00:00 via INTRAVENOUS

## 2016-08-04 NOTE — Telephone Encounter (Signed)
Gave patient avs report and appointments for December. Central radiology will call re scan.  °

## 2016-08-04 NOTE — Progress Notes (Signed)
[facility] OFFICE PROGRESS NOTE   Diagnosis: Breast cancer  INTERVAL HISTORY:   Stacy Horn returns as scheduled. She has an upper respiratory infection with rhinorrhea. She otherwise feels well. She takes hydrocodone 3-4 times per day for relief of back pain. There is a stable rash at the lower legs. No tooth or jaw pain.  Objective:  Vital signs in last 24 hours:  Blood pressure 121/64, pulse 66, temperature 97.7 F (36.5 C), temperature source Oral, resp. rate 18, weight 173 lb 1.6 oz (78.5 kg), last menstrual period 10/08/2014, SpO2 99 %.    HEENT: No thrush or ulcers, no sinus tenderness, pharynx without erythema or exudate Lymphatics: No palpable lymph nodes in the left axilla Resp: Lungs clear bilaterally Cardio: Regular rate and rhythm GI: No hepatomegaly, nontender Vascular: No leg edema  Skin: Maculopapular erythematous rash at the lower leg bilaterally    Lab Results:  Lab Results  Component Value Date   WBC 3.1 (L) 08/04/2016   HGB 13.2 08/04/2016   HCT 38.9 08/04/2016   MCV 104.6 (H) 08/04/2016   PLT 173 08/04/2016   NEUTROABS 2.0 08/04/2016   Calcium 9.6, albumin 4.0  Medications: I have reviewed the patient's current medications.  Assessment/Plan: 1. Multicentric invasive lobular carcinoma of the right breast diagnosed in December of 1999, a right mastectomy followed by adjuvant [facility] chemotherapy, 5 years of tamoxifen, and 5 years of Femara. The Femara was completed in June of 2010. The right breast mass excision in January 2000 was ER positive, PR positive, and HER-2 negative  2. Pain at the left groin area with a CT of the pelvis on 02/07/12 confirming a destructive lytic lesion at the left pubic symphysis, status post a CT-guided biopsy on 02/16/12 confirming metastatic carcinoma, focally ER positive .  -PET scan 03/07/2012 confirmed multiple hypermetabolic bone metastases with no other evidence of metastatic disease  -Status post  palliative radiation to the pelvis beginning on 03/12/2012  -Initiation of Arimidex on 03/27/2012 -initiation of every three-month xgeva on 04/04/2012  -PET scan September 15 [id number] new hypermetabolic bone lesions  -Initiation of Faslodex 09/27/2012  -Restaging PET scan 04/08/2013 with evidence of disease progression in the bones  -Initiation of Aromasin/afinitor 05/29/2013  -Iliac biopsy at [facility] 05/22/2013 confirming HER-2 negative metastatic breast  -PET scan 11/18/2013 with a decrease in hypermetabolic activity  -PET scan 10/08/2014 with progressive diffuse hypermetabolic bone metastases -Initiation of Palbociclib 10/20/2014; Faslodex 10/23/2014. -Palbociclib placed on hold beginning 11/07/2014 due to neutropenia. -Palbociclib resumed 11/27/2014 on an every other day schedule -Palbociclib resumed 01/04/2015 at a reduced dose of 100 mg daily for 21 days. -Palbociclib beginning 02/12/2015 at a dose of 100 mg daily for 14 days on/14 days off. -PET scan 05/18/2015 with diffuse hypermetabolic bone lesions, increased metabolic activity compared to the PET scan from January 2016 -Palbociclib resumed 06/07/2015, Faslodex continued 06/12/2015 -PET scan 09/22/2015 with diffuse hypermetabolic bone lesions, increased metabolic activity compared to the PET scan 05/18/2015 -Continuation of palbociclib and Faslodex -palbociclib and Faslodex discontinued April 2017 secondary to a C7 spinous process fracture and hypercalcemia -PET scan 21/19/[id number] hypermetabolic bone metastases, with increased metabolic activity interpreted as consistent with disease progression - Xeloda (7 days on/7 days off) started 02/28/2016 3. BRCA1 variant felt to be a polymorphism  4. Pain/tenderness at the left low anterolateral chest wall-she completed palliative radiation on 05/28/2012 with improvement in the pain. She was also treated with radiation to the thoracic spine.  5. Anemia/leukopenia-likely  secondary to breast cancer  involving the bone marrow and radiation /afinitor -the anemia is improved  6. History of mildly elevated liver enzymes-? Related to afinitor,? Secondary to metastatic breast cancer 7. Neutropenia secondary to Palbociclib. Improved. 8. Pain at the left shoulder and mid back-a plain x-ray of the left shoulder 06/12/2015 revealed a sclerotic density at the left scapula adjacent to the glenoid, there is metastatic disease involving the humeral head and left scapula on the PET scan 05/18/2015. She completed a course of radiation to the left shoulder on 08/05/2015. 9. Mild hypercalcemia-asymptomatic; Delton See 11/26/2015; persistent/progressive hypercalcemia 12/24/2015. She received Zometa 12/24/2015, 01/19/2016, 02/18/2016, 03/17/2016, and 04/14/2016 10. Low neck/upper back pain 12/24/2015 with MRI showing a mildly displaced acute-appearing C7 spinous process fracture; widespread osseous metastatic disease; no evidence of epidural tumor or spinal cord compression. - Completed palliative radiation to the cervical spine 02/17/2016 11. Dysphagia/odynophagia secondary to radiation-resolved 12. Xeloda-induced skin rash    Disposition:  Stacy Horn continues every other week Xeloda. She is maintained on monthly Zometa. Her pain and anemia have improved since beginning Xeloda. The plan is to continue the current treatment. She will be scheduled for a restaging PET scan prior to an office visit 09/01/2016.  [name], MD  08/04/2016  10:17 AM

## 2016-08-04 NOTE — Patient Instructions (Signed)
Zoledronic Acid injection (Hypercalcemia, Oncology) (Zometa) What is this medicine? ZOLEDRONIC ACID (ZOE le dron ik AS id) lowers the amount of calcium loss from bone. It is used to treat too much calcium in your blood from cancer. It is also used to prevent complications of cancer that has spread to the bone. This medicine may be used for other purposes; ask your health care provider or pharmacist if you have questions. COMMON BRAND NAME(S): Zometa What should I tell my health care provider before I take this medicine? They need to know if you have any of these conditions: -aspirin-sensitive asthma -cancer, especially if you are receiving medicines used to treat cancer -dental disease or wear dentures -infection -kidney disease -receiving corticosteroids like dexamethasone or prednisone -an unusual or allergic reaction to zoledronic acid, other medicines, foods, dyes, or preservatives -pregnant or trying to get pregnant -breast-feeding How should I use this medicine? This medicine is for infusion into a vein. It is given by a health care professional in a hospital or clinic setting. Talk to your pediatrician regarding the use of this medicine in children. Special care may be needed. Overdosage: If you think you have taken too much of this medicine contact a poison control center or emergency room at once. NOTE: This medicine is only for you. Do not share this medicine with others. What if I miss a dose? It is important not to miss your dose. Call your doctor or health care professional if you are unable to keep an appointment. What may interact with this medicine? -certain antibiotics given by injection -NSAIDs, medicines for pain and inflammation, like ibuprofen or naproxen -some diuretics like bumetanide, furosemide -teriparatide -thalidomide This list may not describe all possible interactions. Give your health care provider a list of all the medicines, herbs, non-prescription drugs,  or dietary supplements you use. Also tell them if you smoke, drink alcohol, or use illegal drugs. Some items may interact with your medicine. What should I watch for while using this medicine? Visit your doctor or health care professional for regular checkups. It may be some time before you see the benefit from this medicine. Do not stop taking your medicine unless your doctor tells you to. Your doctor may order blood tests or other tests to see how you are doing. Women should inform their doctor if they wish to become pregnant or think they might be pregnant. There is a potential for serious side effects to an unborn child. Talk to your health care professional or pharmacist for more information. You should make sure that you get enough calcium and vitamin D while you are taking this medicine. Discuss the foods you eat and the vitamins you take with your health care professional. Some people who take this medicine have severe bone, joint, and/or muscle pain. This medicine may also increase your risk for jaw problems or a broken thigh bone. Tell your doctor right away if you have severe pain in your jaw, bones, joints, or muscles. Tell your doctor if you have any pain that does not go away or that gets worse. Tell your dentist and dental surgeon that you are taking this medicine. You should not have major dental surgery while on this medicine. See your dentist to have a dental exam and fix any dental problems before starting this medicine. Take good care of your teeth while on this medicine. Make sure you see your dentist for regular follow-up appointments. What side effects may I notice from receiving this medicine? Side effects   that you should report to your doctor or health care professional as soon as possible: -allergic reactions like skin rash, itching or hives, swelling of the face, lips, or tongue -anxiety, confusion, or depression -breathing problems -changes in vision -eye pain -feeling faint  or lightheaded, falls -jaw pain, especially after dental work -mouth sores -muscle cramps, stiffness, or weakness -redness, blistering, peeling or loosening of the skin, including inside the mouth -trouble passing urine or change in the amount of urine Side effects that usually do not require medical attention (report to your doctor or health care professional if they continue or are bothersome): -bone, joint, or muscle pain -constipation -diarrhea -fever -hair loss -irritation at site where injected -loss of appetite -nausea, vomiting -stomach upset -trouble sleeping -trouble swallowing -weak or tired This list may not describe all possible side effects. Call your doctor for medical advice about side effects. You may report side effects to FDA at 1-800-FDA-1088. Where should I keep my medicine? This drug is given in a hospital or clinic and will not be stored at home. NOTE: This sheet is a summary. It may not cover all possible information. If you have questions about this medicine, talk to your doctor, pharmacist, or health care provider.  2017 Elsevier/Gold Standard (2014-02-01 14:19:39)

## 2016-08-31 ENCOUNTER — Ambulatory Visit (HOSPITAL_COMMUNITY)
Admission: RE | Admit: 2016-08-31 | Discharge: 2016-08-31 | Disposition: A | Discharge status: Home / Self Care | Payer: Medicare Other | Source: Ambulatory Visit | Attending: Oncology | Admitting: Oncology

## 2016-08-31 DIAGNOSIS — J341 Cyst and mucocele of nose and nasal sinus: Secondary | ICD-10-CM | POA: Insufficient documentation

## 2016-08-31 DIAGNOSIS — Z9882 Breast implant status: Secondary | ICD-10-CM | POA: Diagnosis not present

## 2016-08-31 DIAGNOSIS — C50919 Malignant neoplasm of unspecified site of unspecified female breast: Secondary | ICD-10-CM | POA: Insufficient documentation

## 2016-08-31 DIAGNOSIS — N83201 Unspecified ovarian cyst, right side: Secondary | ICD-10-CM | POA: Diagnosis not present

## 2016-08-31 DIAGNOSIS — C7951 Secondary malignant neoplasm of bone: Secondary | ICD-10-CM | POA: Diagnosis not present

## 2016-08-31 DIAGNOSIS — E041 Nontoxic single thyroid nodule: Secondary | ICD-10-CM | POA: Diagnosis not present

## 2016-08-31 DIAGNOSIS — K760 Fatty (change of) liver, not elsewhere classified: Secondary | ICD-10-CM | POA: Diagnosis not present

## 2016-08-31 LAB — GLUCOSE, CAPILLARY: GLUCOSE-CAPILLARY: 95 mg/dL (ref 65–99)

## 2016-08-31 MED ORDER — FLUDEOXYGLUCOSE F - 18 (FDG) INJECTION
8.3000 | Freq: Once | INTRAVENOUS | Status: AC | PRN
  Administered 2016-08-31: 8.3 via INTRAVENOUS

## 2016-09-01 ENCOUNTER — Other Ambulatory Visit (HOSPITAL_BASED_OUTPATIENT_CLINIC_OR_DEPARTMENT_OTHER): Payer: Medicare Other

## 2016-09-01 ENCOUNTER — Other Ambulatory Visit: Payer: Self-pay | Admitting: *Deleted

## 2016-09-01 ENCOUNTER — Ambulatory Visit (HOSPITAL_BASED_OUTPATIENT_CLINIC_OR_DEPARTMENT_OTHER): Payer: 59 | Admitting: Oncology

## 2016-09-01 ENCOUNTER — Ambulatory Visit (HOSPITAL_BASED_OUTPATIENT_CLINIC_OR_DEPARTMENT_OTHER): Payer: Medicare Other

## 2016-09-01 DIAGNOSIS — C7951 Secondary malignant neoplasm of bone: Secondary | ICD-10-CM

## 2016-09-01 DIAGNOSIS — C50911 Malignant neoplasm of unspecified site of right female breast: Secondary | ICD-10-CM

## 2016-09-01 DIAGNOSIS — C50919 Malignant neoplasm of unspecified site of unspecified female breast: Secondary | ICD-10-CM

## 2016-09-01 DIAGNOSIS — Z9889 Other specified postprocedural states: Secondary | ICD-10-CM

## 2016-09-01 LAB — COMPREHENSIVE METABOLIC PANEL
ALK PHOS: 99 U/L (ref 40–150)
ALT: 41 U/L (ref 0–55)
ANION GAP: 10 meq/L (ref 3–11)
AST: 34 U/L (ref 5–34)
Albumin: 4.2 g/dL (ref 3.5–5.0)
BUN: 15.9 mg/dL (ref 7.0–26.0)
CHLORIDE: 108 meq/L (ref 98–109)
CO2: 23 mEq/L (ref 22–29)
CREATININE: 0.7 mg/dL (ref 0.6–1.1)
Calcium: 9.7 mg/dL (ref 8.4–10.4)
EGFR: >90 mL/min/{1.73_m2} (ref >90)
Glucose: 93 mg/dl (ref 70–140)
POTASSIUM: 3.9 meq/L (ref 3.5–5.1)
Sodium: 142 mEq/L (ref 136–145)
Total Bilirubin: 0.93 mg/dL (ref 0.20–1.20)
Total Protein: 7.7 g/dL (ref 6.4–8.3)

## 2016-09-01 LAB — CBC WITH DIFFERENTIAL/PLATELET
BASO%: 0.4 % (ref 0.0–2.0)
BASOS ABS: 0 10*3/uL (ref 0.0–0.1)
EOS%: 1.2 % (ref 0.0–7.0)
Eosinophils Absolute: 0 10*3/uL (ref 0.0–0.5)
HEMATOCRIT: 40.8 % (ref 34.8–46.6)
HEMOGLOBIN: 13.6 g/dL (ref 11.6–15.9)
LYMPH#: 0.8 10*3/uL — AB (ref 0.9–3.3)
LYMPH%: 23.7 % (ref 14.0–49.7)
MCH: 34.5 pg — AB (ref 25.1–34.0)
MCHC: 33.2 g/dL (ref 31.5–36.0)
MCV: 103.8 fL — ABNORMAL HIGH (ref 79.5–101.0)
MONO#: 0.3 10*3/uL (ref 0.1–0.9)
MONO%: 8.4 % (ref 0.0–14.0)
NEUT#: 2.2 10*3/uL (ref 1.5–6.5)
NEUT%: 66.3 % (ref 38.4–76.8)
PLATELETS: 184 10*3/uL (ref 145–400)
RBC: 3.93 10*6/uL (ref 3.70–5.45)
RDW: 17.7 % — AB (ref 11.2–14.5)
WBC: 3.3 10*3/uL — ABNORMAL LOW (ref 3.9–10.3)

## 2016-09-01 MED ORDER — SODIUM CHLORIDE 0.9 % IV SOLN
Freq: Once | INTRAVENOUS | Status: AC
  Administered 2016-09-01: 11:00:00 via INTRAVENOUS

## 2016-09-01 MED ORDER — CAPECITABINE 500 MG PO TABS: 1500.0000 mg | ORAL_TABLET | Freq: Two times a day (BID) | ORAL | 0 refills | Status: DC

## 2016-09-01 MED ORDER — HYDROCODONE-ACETAMINOPHEN 7.5-325 MG PO TABS: 1.0000 | ORAL_TABLET | ORAL | 0 refills | Status: DC | PRN

## 2016-09-01 MED ORDER — ZOLEDRONIC ACID 4 MG/100ML IV SOLN
4.0000 mg | Freq: Once | INTRAVENOUS | Status: AC
  Administered 2016-09-01: 4 mg via INTRAVENOUS
  Filled 2016-09-01: qty 100

## 2016-09-01 NOTE — Patient Instructions (Signed)
Zoledronic Acid injection (Hypercalcemia, Oncology) (Zometa) What is this medicine? ZOLEDRONIC ACID (ZOE le dron ik AS id) lowers the amount of calcium loss from bone. It is used to treat too much calcium in your blood from cancer. It is also used to prevent complications of cancer that has spread to the bone. This medicine may be used for other purposes; ask your health care provider or pharmacist if you have questions. COMMON BRAND NAME(S): Zometa What should I tell my health care provider before I take this medicine? They need to know if you have any of these conditions: -aspirin-sensitive asthma -cancer, especially if you are receiving medicines used to treat cancer -dental disease or wear dentures -infection -kidney disease -receiving corticosteroids like dexamethasone or prednisone -an unusual or allergic reaction to zoledronic acid, other medicines, foods, dyes, or preservatives -pregnant or trying to get pregnant -breast-feeding How should I use this medicine? This medicine is for infusion into a vein. It is given by a health care professional in a hospital or clinic setting. Talk to your pediatrician regarding the use of this medicine in children. Special care may be needed. Overdosage: If you think you have taken too much of this medicine contact a poison control center or emergency room at once. NOTE: This medicine is only for you. Do not share this medicine with others. What if I miss a dose? It is important not to miss your dose. Call your doctor or health care professional if you are unable to keep an appointment. What may interact with this medicine? -certain antibiotics given by injection -NSAIDs, medicines for pain and inflammation, like ibuprofen or naproxen -some diuretics like bumetanide, furosemide -teriparatide -thalidomide This list may not describe all possible interactions. Give your health care provider a list of all the medicines, herbs, non-prescription drugs,  or dietary supplements you use. Also tell them if you smoke, drink alcohol, or use illegal drugs. Some items may interact with your medicine. What should I watch for while using this medicine? Visit your doctor or health care professional for regular checkups. It may be some time before you see the benefit from this medicine. Do not stop taking your medicine unless your doctor tells you to. Your doctor may order blood tests or other tests to see how you are doing. Women should inform their doctor if they wish to become pregnant or think they might be pregnant. There is a potential for serious side effects to an unborn child. Talk to your health care professional or pharmacist for more information. You should make sure that you get enough calcium and vitamin D while you are taking this medicine. Discuss the foods you eat and the vitamins you take with your health care professional. Some people who take this medicine have severe bone, joint, and/or muscle pain. This medicine may also increase your risk for jaw problems or a broken thigh bone. Tell your doctor right away if you have severe pain in your jaw, bones, joints, or muscles. Tell your doctor if you have any pain that does not go away or that gets worse. Tell your dentist and dental surgeon that you are taking this medicine. You should not have major dental surgery while on this medicine. See your dentist to have a dental exam and fix any dental problems before starting this medicine. Take good care of your teeth while on this medicine. Make sure you see your dentist for regular follow-up appointments. What side effects may I notice from receiving this medicine? Side effects   that you should report to your doctor or health care professional as soon as possible: -allergic reactions like skin rash, itching or hives, swelling of the face, lips, or tongue -anxiety, confusion, or depression -breathing problems -changes in vision -eye pain -feeling faint  or lightheaded, falls -jaw pain, especially after dental work -mouth sores -muscle cramps, stiffness, or weakness -redness, blistering, peeling or loosening of the skin, including inside the mouth -trouble passing urine or change in the amount of urine Side effects that usually do not require medical attention (report to your doctor or health care professional if they continue or are bothersome): -bone, joint, or muscle pain -constipation -diarrhea -fever -hair loss -irritation at site where injected -loss of appetite -nausea, vomiting -stomach upset -trouble sleeping -trouble swallowing -weak or tired This list may not describe all possible side effects. Call your doctor for medical advice about side effects. You may report side effects to FDA at 1-800-FDA-1088. Where should I keep my medicine? This drug is given in a hospital or clinic and will not be stored at home. NOTE: This sheet is a summary. It may not cover all possible information. If you have questions about this medicine, talk to your doctor, pharmacist, or health care provider.  2017 Elsevier/Gold Standard (2014-02-01 14:19:39)

## 2016-09-01 NOTE — Progress Notes (Signed)
Stacy Horn OFFICE PROGRESS NOTE   Diagnosis: Breast cancer  INTERVAL HISTORY:   Stacy Horn returns as scheduled. She continues every other week Xeloda. No mouth sores or diarrhea. The skin rash at the lower legs is stable. She has a progressive rash at the anterior chest. Hydrocortisone cream has helped in the past. She reports stable intermittent back pain. She takes hydrocodone several times per day. Good appetite.  Objective:  Vital signs in last 24 hours:  Blood pressure 135/67, pulse 64, temperature 97.7 F (36.5 C), temperature source Oral, resp. rate 18, weight 173 lb 14.4 oz (78.9 kg), last menstrual period 10/08/2014, SpO2 100 %.    HEENT: No thrush or ulcers Lymphatics: Less than 1 cm mobile left axillary node Resp: Lungs clear bilaterally Cardio: Regular rate and rhythm GI: No hepatomegaly, nontender Vascular: No leg edema  Skin: Erythematous rash at the upper anterior chest and bilateral pretibial areas  Lab Results:  Lab Results  Component Value Date   WBC 3.3 (L) 09/01/2016   HGB 13.6 09/01/2016   HCT 40.8 09/01/2016   MCV 103.8 (H) 09/01/2016   PLT 184 09/01/2016   NEUTROABS 2.2 09/01/2016      Imaging:  Nm Pet Image Restag (ps) Skull Base To Thigh  Result Date: 08/31/2016 CLINICAL DATA:  Subsequent treatment strategy for metastatic breast cancer. EXAM: NUCLEAR MEDICINE PET SKULL BASE TO THIGH TECHNIQUE: 8.3 mCi F-18 FDG was injected intravenously. Full-ring PET imaging was performed from the skull base to thigh after the radiotracer. CT data was obtained and used for attenuation correction and anatomic localization. FASTING BLOOD GLUCOSE:  Value: 95 mg/dl COMPARISON:  01/26/2016 FINDINGS: NECK No hypermetabolic lymph nodes in the neck. Mucous retention cyst noted in the right maxillary sinus inferiorly. The dense 1.5 by 1.0 cm a left thyroid nodule is not hypermetabolic. CHEST No hypermetabolic mediastinal or hilar nodes. No suspicious  pulmonary nodules on the CT scan. Bilateral breast implants. ABDOMEN/PELVIS No abnormal hypermetabolic activity within the liver, pancreas, adrenal glands, or spleen. No hypermetabolic lymph nodes in the abdomen or pelvis. Geographic hepatic steatosis causing hepatic heterogeneity. Photopenic left kidney lower pole presumed cyst, 16 Hounsfield units. 2.4 by 2.0 cm cystic lesion of the right ovary, similar to prior, not hypermetabolic. SKELETON The previous pattern of innumerable scattered in somewhat confluent osseous metastatic disease throughout the skeleton is greatly improved. Now there are few scattered foci of hypermetabolic activity in the skeleton, but in general the metabolic activity is dramatically better. For example, the region of the left iliac crest posteriorly which previously had a maximum standard uptake value 11.7 currently has a maximum standard uptake value of 4.4. There are scattered lytic and sclerotic lesions which are not hypermetabolic currently. One of the more metabolic of the residual lesions is in the left T8 transverse process, focal activity at this level noted with maximum SUV 15.3. IMPRESSION: 1. Prominent reduction in metabolic activity associated with the diffuse the osseous metastatic disease. There are scattered foci of residual hypermetabolic activity, generally significantly decreased from prior, and the vast majority of the prior hypermetabolic activity has resolved within the bones. The regional lesions are still visible as primarily sclerotic but some lytic lesions as well. 2. No current hypermetabolic extra skeletal/soft tissue metastatic disease identified. 3. Other imaging findings of potential clinical significance: Mucous retention cyst in the right maxillary sinus. Right ovarian cyst, photopenic. Left kidney lower pole cyst, photopenic. Geographic hepatic steatosis. Electronically Signed   By: Cindra Eves.D.  On: 08/31/2016 08:47    Medications: I have  reviewed the patient's current medications.  Assessment/Plan:  1. Multicentric invasive lobular carcinoma of the right breast diagnosed in December of 1999, a right mastectomy followed by adjuvant [facility] chemotherapy, 5 years of tamoxifen, and 5 years of Femara. The Femara was completed in June of 2010. The right breast mass excision in January 2000 was ER positive, PR positive, and HER-2 negative  2. Pain at the left groin area with a CT of the pelvis on 02/07/12 confirming a destructive lytic lesion at the left pubic symphysis, status post a CT-guided biopsy on 02/16/12 confirming metastatic carcinoma, focally ER positive .  -PET scan 03/07/2012 confirmed multiple hypermetabolic bone metastases with no other evidence of metastatic disease  -Status post palliative radiation to the pelvis beginning on 03/12/2012  -Initiation of Arimidex on 03/27/2012 -initiation of every three-month xgeva on 04/04/2012  -PET scan September 14 [id number] new hypermetabolic bone lesions  -Initiation of Faslodex 09/27/2012  -Restaging PET scan 04/08/2013 with evidence of disease progression in the bones  -Initiation of Aromasin/afinitor 05/29/2013  -Iliac biopsy at [facility] 05/22/2013 confirming HER-2 negative metastatic breast  -PET scan 11/18/2013 with a decrease in hypermetabolic activity  -PET scan 10/08/2014 with progressive diffuse hypermetabolic bone metastases -Initiation of Palbociclib 10/20/2014; Faslodex 10/23/2014. -Palbociclib placed on hold beginning 11/07/2014 due to neutropenia. -Palbociclib resumed 11/27/2014 on an every other day schedule -Palbociclib resumed 01/04/2015 at a reduced dose of 100 mg daily for 21 days. -Palbociclib beginning 02/12/2015 at a dose of 100 mg daily for 14 days on/14 days off. -PET scan 05/18/2015 with diffuse hypermetabolic bone lesions, increased metabolic activity compared to the PET scan from January 2016 -Palbociclib resumed 06/07/2015, Faslodex continued  06/12/2015 -PET scan 09/22/2015 with diffuse hypermetabolic bone lesions, increased metabolic activity compared to the PET scan 05/18/2015 -Continuation of palbociclib and Faslodex -palbociclib and Faslodex discontinued April 2017 secondary to a C7 spinous process fracture and hypercalcemia -PET scan 78/93/[id number] hypermetabolic bone metastases, with increased metabolic activity interpreted as consistent with disease progression - Xeloda (7 days on/7 days off) started 02/28/2016 -PET scan 08/31/2016-marked improvement in hypermetabolic bone activity, no extra osseous disease -Xeloda continued 3. BRCA1 variant felt to be a polymorphism  4. Pain/tenderness at the left low anterolateral chest wall-she completed palliative radiation on 05/28/2012 with improvement in the pain. She was also treated with radiation to the thoracic spine.  5. Anemia/leukopenia-likely secondary to breast cancer involving the bone marrow and radiation /afinitor -the anemia is improved  6. History of mildly elevated liver enzymes-? Related to afinitor,? Secondary to metastatic breast cancer 7. Neutropenia secondary to Palbociclib. Improved. 8. Pain at the left shoulder and mid back-a plain x-ray of the left shoulder 06/12/2015 revealed a sclerotic density at the left scapula adjacent to the glenoid, there is metastatic disease involving the humeral head and left scapula on the PET scan 05/18/2015. She completed a course of radiation to the left shoulder on 08/05/2015. 9. Mild hypercalcemia-asymptomatic; Delton See 11/26/2015; persistent/progressive hypercalcemia 12/24/2015. She received Zometa 12/24/2015, 01/19/2016, 02/18/2016, 03/17/2016, and 04/14/2016 10. Low neck/upper back pain 12/24/2015 with MRI showing a mildly displaced acute-appearing C7 spinous process fracture; widespread osseous metastatic disease; no evidence of epidural tumor or spinal cord compression. - Completed palliative radiation to the cervical spine  02/17/2016 11. Dysphagia/odynophagia secondary to radiation-resolved 12. Xeloda-induced skin rash     Disposition:  Stacy Horn appears stable. I reviewed the PET images with her. The hypermetabolic bone metastases appear much improved. The plan is  to continue Xeloda. She will try topical steroid cream for the chest/leg rash.  She continues monthly Zometa for treatment of bone metastases and hypercalcemia.  Stacy Horn will return for an office visit and Zometa in one month.  [name], MD  09/01/2016  10:34 AM

## 2016-09-02 ENCOUNTER — Telehealth: Payer: Self-pay | Admitting: *Deleted

## 2016-09-02 DIAGNOSIS — C7951 Secondary malignant neoplasm of bone: Secondary | ICD-10-CM

## 2016-09-02 MED ORDER — CAPECITABINE 500 MG PO TABS: 1500.0000 mg | ORAL_TABLET | Freq: Two times a day (BID) | ORAL | 0 refills | Status: DC

## 2016-09-02 MED ORDER — TRIAMCINOLONE ACETONIDE 0.1 % EX CREA: 1.0000 "application " | TOPICAL_CREAM | Freq: Two times a day (BID) | CUTANEOUS | 0 refills | Status: AC

## 2016-09-02 NOTE — Telephone Encounter (Signed)
Received a fax request to refill Xeloda from [facility]. Fax signed and sent back per Dr. Benay Spice.

## 2016-09-02 NOTE — Telephone Encounter (Signed)
Per LOS I have scheduled appts and notified the scheduler 

## 2016-09-08 DIAGNOSIS — G43009 Migraine without aura, not intractable, without status migrainosus: Secondary | ICD-10-CM | POA: Diagnosis not present

## 2016-09-14 DIAGNOSIS — Z6829 Body mass index (BMI) 29.0-29.9, adult: Secondary | ICD-10-CM | POA: Diagnosis not present

## 2016-09-14 DIAGNOSIS — M8588 Other specified disorders of bone density and structure, other site: Secondary | ICD-10-CM | POA: Diagnosis not present

## 2016-09-14 DIAGNOSIS — Z23 Encounter for immunization: Secondary | ICD-10-CM | POA: Diagnosis not present

## 2016-09-14 DIAGNOSIS — Z124 Encounter for screening for malignant neoplasm of cervix: Secondary | ICD-10-CM | POA: Diagnosis not present

## 2016-09-14 DIAGNOSIS — N958 Other specified menopausal and perimenopausal disorders: Secondary | ICD-10-CM | POA: Diagnosis not present

## 2016-09-29 ENCOUNTER — Telehealth: Payer: Self-pay | Admitting: Nurse Practitioner

## 2016-09-29 ENCOUNTER — Ambulatory Visit (HOSPITAL_BASED_OUTPATIENT_CLINIC_OR_DEPARTMENT_OTHER): Payer: 59 | Admitting: Nurse Practitioner

## 2016-09-29 ENCOUNTER — Ambulatory Visit (HOSPITAL_BASED_OUTPATIENT_CLINIC_OR_DEPARTMENT_OTHER): Payer: Medicare Other

## 2016-09-29 ENCOUNTER — Other Ambulatory Visit (HOSPITAL_BASED_OUTPATIENT_CLINIC_OR_DEPARTMENT_OTHER): Payer: Medicare Other

## 2016-09-29 ENCOUNTER — Other Ambulatory Visit: Payer: Self-pay | Admitting: *Deleted

## 2016-09-29 DIAGNOSIS — R21 Rash and other nonspecific skin eruption: Secondary | ICD-10-CM | POA: Diagnosis not present

## 2016-09-29 DIAGNOSIS — C50919 Malignant neoplasm of unspecified site of unspecified female breast: Secondary | ICD-10-CM

## 2016-09-29 DIAGNOSIS — D630 Anemia in neoplastic disease: Secondary | ICD-10-CM | POA: Diagnosis not present

## 2016-09-29 DIAGNOSIS — C50911 Malignant neoplasm of unspecified site of right female breast: Secondary | ICD-10-CM

## 2016-09-29 DIAGNOSIS — C7951 Secondary malignant neoplasm of bone: Secondary | ICD-10-CM

## 2016-09-29 DIAGNOSIS — Z9889 Other specified postprocedural states: Secondary | ICD-10-CM

## 2016-09-29 LAB — CBC WITH DIFFERENTIAL/PLATELET
BASO%: 0.2 % (ref 0.0–2.0)
Basophils Absolute: 0 10*3/uL (ref 0.0–0.1)
EOS ABS: 0.1 10*3/uL (ref 0.0–0.5)
EOS%: 1.1 % (ref 0.0–7.0)
HEMATOCRIT: 39.5 % (ref 34.8–46.6)
HEMOGLOBIN: 13.6 g/dL (ref 11.6–15.9)
LYMPH%: 17.9 % (ref 14.0–49.7)
MCH: 34.9 pg — AB (ref 25.1–34.0)
MCHC: 34.4 g/dL (ref 31.5–36.0)
MCV: 101.3 fL — AB (ref 79.5–101.0)
MONO#: 0.5 10*3/uL (ref 0.1–0.9)
MONO%: 10.1 % (ref 0.0–14.0)
NEUT#: 3.4 10*3/uL (ref 1.5–6.5)
NEUT%: 70.7 % (ref 38.4–76.8)
Platelets: 169 10*3/uL (ref 145–400)
RBC: 3.9 10*6/uL (ref 3.70–5.45)
RDW: 15.9 % — AB (ref 11.2–14.5)
WBC: 4.8 10*3/uL (ref 3.9–10.3)
lymph#: 0.9 10*3/uL (ref 0.9–3.3)

## 2016-09-29 LAB — COMPREHENSIVE METABOLIC PANEL
ALBUMIN: 4.4 g/dL (ref 3.5–5.0)
ALK PHOS: 95 U/L (ref 40–150)
ALT: 39 U/L (ref 0–55)
AST: 31 U/L (ref 5–34)
Anion Gap: 10 mEq/L (ref 3–11)
BUN: 19.1 mg/dL (ref 7.0–26.0)
CALCIUM: 10.5 mg/dL — AB (ref 8.4–10.4)
CHLORIDE: 104 meq/L (ref 98–109)
CO2: 25 mEq/L (ref 22–29)
Creatinine: 0.8 mg/dL (ref 0.6–1.1)
EGFR: 81 mL/min/{1.73_m2} — AB (ref >90)
Glucose: 87 mg/dl (ref 70–140)
POTASSIUM: 4.1 meq/L (ref 3.5–5.1)
SODIUM: 140 meq/L (ref 136–145)
Total Bilirubin: 0.97 mg/dL (ref 0.20–1.20)
Total Protein: 7.7 g/dL (ref 6.4–8.3)

## 2016-09-29 MED ORDER — CAPECITABINE 500 MG PO TABS: 1500.0000 mg | ORAL_TABLET | Freq: Two times a day (BID) | ORAL | 0 refills | Status: DC

## 2016-09-29 MED ORDER — HYDROCODONE-ACETAMINOPHEN 7.5-325 MG PO TABS: 1.0000 | ORAL_TABLET | ORAL | 0 refills | Status: DC | PRN

## 2016-09-29 MED ORDER — SODIUM CHLORIDE 0.9 % IV SOLN
Freq: Once | INTRAVENOUS | Status: AC
  Administered 2016-09-29: 12:00:00 via INTRAVENOUS

## 2016-09-29 MED ORDER — ZOLEDRONIC ACID 4 MG/100ML IV SOLN
4.0000 mg | Freq: Once | INTRAVENOUS | Status: AC
  Administered 2016-09-29: 4 mg via INTRAVENOUS
  Filled 2016-09-29: qty 100

## 2016-09-29 NOTE — Progress Notes (Signed)
[place] OFFICE PROGRESS NOTE   Diagnosis:  Breast cancer  INTERVAL HISTORY:   Stacy Horn returns as scheduled. She continues every other week Xeloda. She denies nausea/vomiting. No mouth sores. No diarrhea. No hand or foot pain or redness. She continues to note a skin rash at the lower legs and anterior chest. Overall she feels the rash is somewhat improved. She has stable back pain. She takes hydrocodone about 3 times a day. She tripped over a chair recently and bruised the right low back.  Objective:  Vital signs in last 24 hours:  Blood pressure 115/83, pulse 76, temperature 98.7 F (37.1 C), temperature source Oral, resp. rate 16, height _0  (1.651 m), weight 173 lb 6.4 oz (78.7 kg), last menstrual period 10/08/2014, SpO2 100 %.    HEENT: No thrush or ulcers. Lymphatics: Less than 1 cm mobile left axillary lymph node. Resp: Lungs clear bilaterally. Cardio: Regular rate and rhythm. GI: Abdomen soft and nontender. No hepatomegaly. Vascular: No leg edema. Skin: Nodular erythematous rash at the bilateral pretibial areas. Faint, similar appearing rash at the upper anterior chest. Palms without erythema. Resolving ecchymosis right lower back.   Lab Results:  Lab Results  Component Value Date   WBC 4.8 09/29/2016   HGB 13.6 09/29/2016   HCT 39.5 09/29/2016   MCV 101.3 (H) 09/29/2016   PLT 169 09/29/2016   NEUTROABS 3.4 09/29/2016    Imaging:  No results found.  Medications: I have reviewed the patient's current medications.  Assessment/Plan: 1. Multicentric invasive lobular carcinoma of the right breast diagnosed in December of 1999, a right mastectomy followed by adjuvant [facility] chemotherapy, 5 years of tamoxifen, and 5 years of Femara. The Femara was completed in June of 2010. The right breast mass excision in January 2000 was ER positive, PR positive, and HER-2 negative  2. Pain at the left groin area with a CT of the pelvis on 02/07/12 confirming a  destructive lytic lesion at the left pubic symphysis, status post a CT-guided biopsy on 02/16/12 confirming metastatic carcinoma, focally ER positive .  -PET scan 03/07/2012 confirmed multiple hypermetabolic bone metastases with no other evidence of metastatic disease  -Status post palliative radiation to the pelvis beginning on 03/12/2012  -Initiation of Arimidex on 03/27/2012 -initiation of every three-month xgeva on 04/04/2012  -PET scan September 14 [id number] new hypermetabolic bone lesions  -Initiation of Faslodex 09/27/2012  -Restaging PET scan 04/08/2013 with evidence of disease progression in the bones  -Initiation of Aromasin/afinitor 05/29/2013  -Iliac biopsy at [facility] 05/22/2013 confirming HER-2 negative metastatic breast  -PET scan 11/18/2013 with a decrease in hypermetabolic activity  -PET scan 10/08/2014 with progressive diffuse hypermetabolic bone metastases -Initiation of Palbociclib 10/20/2014; Faslodex 10/23/2014. -Palbociclib placed on hold beginning 11/07/2014 due to neutropenia. -Palbociclib resumed 11/27/2014 on an every other day schedule -Palbociclib resumed 01/04/2015 at a reduced dose of 100 mg daily for 21 days. -Palbociclib beginning 02/12/2015 at a dose of 100 mg daily for 14 days on/14 days off. -PET scan 05/18/2015 with diffuse hypermetabolic bone lesions, increased metabolic activity compared to the PET scan from January 2016 -Palbociclib resumed 06/07/2015, Faslodex continued 06/12/2015 -PET scan 09/22/2015 with diffuse hypermetabolic bone lesions, increased metabolic activity compared to the PET scan 05/18/2015 -Continuation of palbociclib and Faslodex -palbociclib and Faslodex discontinued April 2017 secondary to a C7 spinous process fracture and hypercalcemia -PET scan 61/95/[id number] hypermetabolic bone metastases, with increased metabolic activity interpreted as consistent with disease progression - Xeloda (7 days on/7 days off)  started  02/28/2016 -PET scan 08/31/2016-marked improvement in hypermetabolic bone activity, no extra osseous disease -Xeloda continued 3. BRCA1 variant felt to be a polymorphism  4. Pain/tenderness at the left low anterolateral chest wall-she completed palliative radiation on 05/28/2012 with improvement in the pain. She was also treated with radiation to the thoracic spine.  5. Anemia/leukopenia-likely secondary to breast cancer involving the bone marrow and radiation /afinitor -the anemia is improved  6. History of mildly elevated liver enzymes-? Related to afinitor,? Secondary to metastatic breast cancer 7. Neutropenia secondary to Palbociclib. Improved. 8. Pain at the left shoulder and mid back-a plain x-ray of the left shoulder 06/12/2015 revealed a sclerotic density at the left scapula adjacent to the glenoid, there is metastatic disease involving the humeral head and left scapula on the PET scan 05/18/2015. She completed a course of radiation to the left shoulder on 08/05/2015. 9. Mild hypercalcemia-asymptomatic; Delton See 11/26/2015; persistent/progressive hypercalcemia 12/24/2015. She received Zometa 12/24/2015, 01/19/2016, 02/18/2016, 03/17/2016, and 04/14/2016 10. Low neck/upper back pain 12/24/2015 with MRI showing a mildly displaced acute-appearing C7 spinous process fracture; widespread osseous metastatic disease; no evidence of epidural tumor or spinal cord compression. - Completed palliative radiation to the cervical spine 02/17/2016 11. Dysphagia/odynophagia secondary to radiation-resolved 12. Xeloda-induced skin rash   Disposition: [name] appears stable. She will continue Xeloda 1 week on/1 week off.  For the Xeloda-related skin rash she will continue the topical steroid cream and will also try a moisturizing lotion.  She continues monthly Zometa for treatment of bone metastases and hypercalcemia.  She was given a new hydrocodone prescription at today's visit.  She will return  for a follow-up visit and Zometa in one month.    Plan reviewed with Dr. Benay Spice.    Ned Card ANP/GNP-BC   09/29/2016  11:01 AM

## 2016-09-29 NOTE — Patient Instructions (Signed)
Zoledronic Acid injection (Hypercalcemia, Oncology) (Zometa) What is this medicine? ZOLEDRONIC ACID (ZOE le dron ik AS id) lowers the amount of calcium loss from bone. It is used to treat too much calcium in your blood from cancer. It is also used to prevent complications of cancer that has spread to the bone. This medicine may be used for other purposes; ask your health care provider or pharmacist if you have questions. COMMON BRAND NAME(S): Zometa What should I tell my health care provider before I take this medicine? They need to know if you have any of these conditions: -aspirin-sensitive asthma -cancer, especially if you are receiving medicines used to treat cancer -dental disease or wear dentures -infection -kidney disease -receiving corticosteroids like dexamethasone or prednisone -an unusual or allergic reaction to zoledronic acid, other medicines, foods, dyes, or preservatives -pregnant or trying to get pregnant -breast-feeding How should I use this medicine? This medicine is for infusion into a vein. It is given by a health care professional in a hospital or clinic setting. Talk to your pediatrician regarding the use of this medicine in children. Special care may be needed. Overdosage: If you think you have taken too much of this medicine contact a poison control center or emergency room at once. NOTE: This medicine is only for you. Do not share this medicine with others. What if I miss a dose? It is important not to miss your dose. Call your doctor or health care professional if you are unable to keep an appointment. What may interact with this medicine? -certain antibiotics given by injection -NSAIDs, medicines for pain and inflammation, like ibuprofen or naproxen -some diuretics like bumetanide, furosemide -teriparatide -thalidomide This list may not describe all possible interactions. Give your health care provider a list of all the medicines, herbs, non-prescription drugs,  or dietary supplements you use. Also tell them if you smoke, drink alcohol, or use illegal drugs. Some items may interact with your medicine. What should I watch for while using this medicine? Visit your doctor or health care professional for regular checkups. It may be some time before you see the benefit from this medicine. Do not stop taking your medicine unless your doctor tells you to. Your doctor may order blood tests or other tests to see how you are doing. Women should inform their doctor if they wish to become pregnant or think they might be pregnant. There is a potential for serious side effects to an unborn child. Talk to your health care professional or pharmacist for more information. You should make sure that you get enough calcium and vitamin D while you are taking this medicine. Discuss the foods you eat and the vitamins you take with your health care professional. Some people who take this medicine have severe bone, joint, and/or muscle pain. This medicine may also increase your risk for jaw problems or a broken thigh bone. Tell your doctor right away if you have severe pain in your jaw, bones, joints, or muscles. Tell your doctor if you have any pain that does not go away or that gets worse. Tell your dentist and dental surgeon that you are taking this medicine. You should not have major dental surgery while on this medicine. See your dentist to have a dental exam and fix any dental problems before starting this medicine. Take good care of your teeth while on this medicine. Make sure you see your dentist for regular follow-up appointments. What side effects may I notice from receiving this medicine? Side effects   that you should report to your doctor or health care professional as soon as possible: -allergic reactions like skin rash, itching or hives, swelling of the face, lips, or tongue -anxiety, confusion, or depression -breathing problems -changes in vision -eye pain -feeling faint  or lightheaded, falls -jaw pain, especially after dental work -mouth sores -muscle cramps, stiffness, or weakness -redness, blistering, peeling or loosening of the skin, including inside the mouth -trouble passing urine or change in the amount of urine Side effects that usually do not require medical attention (report to your doctor or health care professional if they continue or are bothersome): -bone, joint, or muscle pain -constipation -diarrhea -fever -hair loss -irritation at site where injected -loss of appetite -nausea, vomiting -stomach upset -trouble sleeping -trouble swallowing -weak or tired This list may not describe all possible side effects. Call your doctor for medical advice about side effects. You may report side effects to FDA at 1-800-FDA-1088. Where should I keep my medicine? This drug is given in a hospital or clinic and will not be stored at home. NOTE: This sheet is a summary. It may not cover all possible information. If you have questions about this medicine, talk to your doctor, pharmacist, or health care provider.  2017 Elsevier/Gold Standard (2014-02-01 14:19:39)

## 2016-09-29 NOTE — Telephone Encounter (Signed)
Pt confirmed appt and avs was given.

## 2016-10-27 ENCOUNTER — Other Ambulatory Visit: Payer: Self-pay | Admitting: *Deleted

## 2016-10-27 ENCOUNTER — Ambulatory Visit (HOSPITAL_BASED_OUTPATIENT_CLINIC_OR_DEPARTMENT_OTHER): Payer: Medicare Other

## 2016-10-27 ENCOUNTER — Other Ambulatory Visit (HOSPITAL_BASED_OUTPATIENT_CLINIC_OR_DEPARTMENT_OTHER): Payer: Medicare Other

## 2016-10-27 ENCOUNTER — Ambulatory Visit (HOSPITAL_BASED_OUTPATIENT_CLINIC_OR_DEPARTMENT_OTHER): Payer: Medicare Other | Admitting: Oncology

## 2016-10-27 ENCOUNTER — Telehealth: Payer: Self-pay | Admitting: *Deleted

## 2016-10-27 ENCOUNTER — Telehealth: Payer: Self-pay | Admitting: Oncology

## 2016-10-27 VITALS — BP 121/71 | HR 76 | Temp 98.2°F | Resp 18 | Ht 65.0 in | Wt 178.1 lb

## 2016-10-27 DIAGNOSIS — C50919 Malignant neoplasm of unspecified site of unspecified female breast: Secondary | ICD-10-CM

## 2016-10-27 DIAGNOSIS — C50911 Malignant neoplasm of unspecified site of right female breast: Secondary | ICD-10-CM

## 2016-10-27 DIAGNOSIS — C7951 Secondary malignant neoplasm of bone: Secondary | ICD-10-CM | POA: Diagnosis not present

## 2016-10-27 DIAGNOSIS — M5489 Other dorsalgia: Secondary | ICD-10-CM

## 2016-10-27 DIAGNOSIS — Z9889 Other specified postprocedural states: Secondary | ICD-10-CM

## 2016-10-27 DIAGNOSIS — Z853 Personal history of malignant neoplasm of breast: Secondary | ICD-10-CM

## 2016-10-27 LAB — COMPREHENSIVE METABOLIC PANEL
ALBUMIN: 4.1 g/dL (ref 3.5–5.0)
ALK PHOS: 89 U/L (ref 40–150)
ALT: 42 U/L (ref 0–55)
ANION GAP: 9 meq/L (ref 3–11)
AST: 30 U/L (ref 5–34)
BUN: 17.4 mg/dL (ref 7.0–26.0)
CALCIUM: 9.9 mg/dL (ref 8.4–10.4)
CO2: 25 mEq/L (ref 22–29)
Chloride: 108 mEq/L (ref 98–109)
Creatinine: 0.8 mg/dL (ref 0.6–1.1)
EGFR: 79 mL/min/{1.73_m2} — AB (ref >90)
GLUCOSE: 108 mg/dL (ref 70–140)
POTASSIUM: 3.8 meq/L (ref 3.5–5.1)
SODIUM: 142 meq/L (ref 136–145)
Total Bilirubin: 0.71 mg/dL (ref 0.20–1.20)
Total Protein: 7.1 g/dL (ref 6.4–8.3)

## 2016-10-27 LAB — CBC WITH DIFFERENTIAL/PLATELET
BASO%: 0.4 % (ref 0.0–2.0)
BASOS ABS: 0 10*3/uL (ref 0.0–0.1)
EOS ABS: 0.1 10*3/uL (ref 0.0–0.5)
EOS%: 1.5 % (ref 0.0–7.0)
HCT: 38.3 % (ref 34.8–46.6)
HGB: 13.2 g/dL (ref 11.6–15.9)
LYMPH%: 16.4 % (ref 14.0–49.7)
MCH: 35.6 pg — AB (ref 25.1–34.0)
MCHC: 34.5 g/dL (ref 31.5–36.0)
MCV: 103.1 fL — AB (ref 79.5–101.0)
MONO#: 0.3 10*3/uL (ref 0.1–0.9)
MONO%: 7.5 % (ref 0.0–14.0)
NEUT#: 2.9 10*3/uL (ref 1.5–6.5)
NEUT%: 74.2 % (ref 38.4–76.8)
PLATELETS: 181 10*3/uL (ref 145–400)
RBC: 3.71 10*6/uL (ref 3.70–5.45)
RDW: 17.8 % — AB (ref 11.2–14.5)
WBC: 3.9 10*3/uL (ref 3.9–10.3)
lymph#: 0.6 10*3/uL — ABNORMAL LOW (ref 0.9–3.3)

## 2016-10-27 MED ORDER — SODIUM CHLORIDE 0.9 % IV SOLN
Freq: Once | INTRAVENOUS | Status: AC
  Administered 2016-10-27: 12:00:00 via INTRAVENOUS

## 2016-10-27 MED ORDER — CAPECITABINE 500 MG PO TABS: 1500.0000 mg | ORAL_TABLET | Freq: Two times a day (BID) | ORAL | 0 refills | Status: DC

## 2016-10-27 MED ORDER — ZOLEDRONIC ACID 4 MG/100ML IV SOLN
4.0000 mg | Freq: Once | INTRAVENOUS | Status: AC
  Administered 2016-10-27: 4 mg via INTRAVENOUS
  Filled 2016-10-27: qty 100

## 2016-10-27 NOTE — Patient Instructions (Signed)

## 2016-10-27 NOTE — Telephone Encounter (Signed)
Appointments scheduled per 2/8 LOS. Patient given AVS report and calendars with future scheduled appointments.  °

## 2016-10-27 NOTE — Telephone Encounter (Signed)
Per 2/8 LOS and staff message I have scheduled appts and notified the scheduler

## 2016-10-27 NOTE — Progress Notes (Signed)
[place] OFFICE PROGRESS NOTE   Diagnosis: Breast cancer  INTERVAL HISTORY:   [name] returns as scheduled. She continues to have pain at the right upper back that is worse with standing. She takes hydrocodone 3-4 times per day. She continues Xeloda. No mouth sores or diarrhea. The rash at the lower legs has improved. Good appetite. No new complaint.  Objective:  Vital signs in last 24 hours:  Blood pressure 121/71, pulse 76, temperature 98.2 F (36.8 C), temperature source Oral, resp. rate 18, height '5\' 5"'  (1.651 m), weight 178 lb 1.6 oz (80.8 kg), last menstrual period 10/08/2014, SpO2 99 %.    HEENT: No thrush or ulcers Resp: Lungs clear bilaterally Cardio: Regular rate and rhythm GI: No hepatomegaly, nontender Vascular: No leg edema  Skin: Erythematous rash at the lower leg bilaterally, similar rash to a lesser extent at the forearms and upper chest     Lab Results:  Lab Results  Component Value Date   WBC 3.9 10/27/2016   HGB 13.2 10/27/2016   HCT 38.3 10/27/2016   MCV 103.1 (H) 10/27/2016   PLT 181 10/27/2016   NEUTROABS 2.9 10/27/2016    Medications: I have reviewed the patient's current medications.  Assessment/Plan: 1. Multicentric invasive lobular carcinoma of the right breast diagnosed in December of 1999, a right mastectomy followed by adjuvant [facility] chemotherapy, 5 years of tamoxifen, and 5 years of Femara. The Femara was completed in June of 2010. The right breast mass excision in January 2000 was ER positive, PR positive, and HER-2 negative  2. Pain at the left groin area with a CT of the pelvis on 02/07/12 confirming a destructive lytic lesion at the left pubic symphysis, status post a CT-guided biopsy on 02/16/12 confirming metastatic carcinoma, focally ER positive .  -PET scan 03/07/2012 confirmed multiple hypermetabolic bone metastases with no other evidence of metastatic disease  -Status post palliative radiation to the pelvis  beginning on 03/12/2012  -Initiation of Arimidex on 03/27/2012 -initiation of every three-month xgeva on 04/04/2012  -PET scan September 14 [id number] new hypermetabolic bone lesions  -Initiation of Faslodex 09/27/2012  -Restaging PET scan 04/08/2013 with evidence of disease progression in the bones  -Initiation of Aromasin/afinitor 05/29/2013  -Iliac biopsy at [facility] 05/22/2013 confirming HER-2 negative metastatic breast  -PET scan 11/18/2013 with a decrease in hypermetabolic activity  -PET scan 10/08/2014 with progressive diffuse hypermetabolic bone metastases -Initiation of Palbociclib 10/20/2014; Faslodex 10/23/2014. -Palbociclib placed on hold beginning 11/07/2014 due to neutropenia. -Palbociclib resumed 11/27/2014 on an every other day schedule -Palbociclib resumed 01/04/2015 at a reduced dose of 100 mg daily for 21 days. -Palbociclib beginning 02/12/2015 at a dose of 100 mg daily for 14 days on/14 days off. -PET scan 05/18/2015 with diffuse hypermetabolic bone lesions, increased metabolic activity compared to the PET scan from January 2016 -Palbociclib resumed 06/07/2015, Faslodex continued 06/12/2015 -PET scan 09/22/2015 with diffuse hypermetabolic bone lesions, increased metabolic activity compared to the PET scan 05/18/2015 -Continuation of palbociclib and Faslodex -palbociclib and Faslodex discontinued April 2017 secondary to a C7 spinous process fracture and hypercalcemia -PET scan 38/38/[id number] hypermetabolic bone metastases, with increased metabolic activity interpreted as consistent with disease progression - Xeloda (7 days on/7 days off) started 02/28/2016 -PET scan 08/31/2016-marked improvement in hypermetabolic bone activity, no extra osseous disease -Xeloda continued 3. BRCA1 variant felt to be a polymorphism  4. Pain/tenderness at the left low anterolateral chest wall-she completed palliative radiation on 05/28/2012 with improvement in the pain. She was also  treated with radiation to the thoracic spine.  5. Anemia/leukopenia-likely secondary to breast cancer involving the bone marrow and radiation /afinitor -the anemia is improved  6. History of mildly elevated liver enzymes-? Related to afinitor,? Secondary to metastatic breast cancer 7. Neutropenia secondary to Palbociclib. Improved. 8. Pain at the left shoulder and mid back-a plain x-ray of the left shoulder 06/12/2015 revealed a sclerotic density at the left scapula adjacent to the glenoid, there is metastatic disease involving the humeral head and left scapula on the PET scan 05/18/2015. She completed a course of radiation to the left shoulder on 08/05/2015. 9. Mild hypercalcemia-asymptomatic; Delton See 11/26/2015; persistent/progressive hypercalcemia 12/24/2015. She received Zometa 12/24/2015, she continues monthly Zometa.  10. Low neck/upper back pain 12/24/2015 with MRI showing a mildly displaced acute-appearing C7 spinous process fracture; widespread osseous metastatic disease; no evidence of epidural tumor or spinal cord compression. - Completed palliative radiation to the cervical spine 02/17/2016 11. Dysphagia/odynophagia secondary to radiation-resolved 12. Xeloda-induced skin rash   Disposition:  Her overall status appears unchanged. The plan is to continue every other week Xeloda. She will continue monthly Zometa. [name] will return for a lab visit and Zometa and one month. She will be scheduled for an office visit in 2 months.  [name], MD  10/27/2016  10:50 AM

## 2016-10-28 ENCOUNTER — Other Ambulatory Visit: Payer: Self-pay | Admitting: *Deleted

## 2016-10-28 DIAGNOSIS — C7951 Secondary malignant neoplasm of bone: Secondary | ICD-10-CM

## 2016-10-28 MED ORDER — CAPECITABINE 500 MG PO TABS: 1500.0000 mg | ORAL_TABLET | Freq: Two times a day (BID) | ORAL | 0 refills | Status: DC

## 2016-11-10 ENCOUNTER — Telehealth: Payer: Self-pay | Admitting: *Deleted

## 2016-11-10 DIAGNOSIS — C50919 Malignant neoplasm of unspecified site of unspecified female breast: Secondary | ICD-10-CM

## 2016-11-10 DIAGNOSIS — C7951 Secondary malignant neoplasm of bone: Secondary | ICD-10-CM

## 2016-11-10 MED ORDER — HYDROCODONE-ACETAMINOPHEN 7.5-325 MG PO TABS: 1.0000 | ORAL_TABLET | ORAL | 0 refills | Status: DC | PRN

## 2016-11-10 NOTE — Telephone Encounter (Addendum)
Message from pt requesting refill of Hydrocodone. She will run out prior to next visit. Pt informed that script is ready for pick up.

## 2016-11-24 ENCOUNTER — Other Ambulatory Visit (HOSPITAL_BASED_OUTPATIENT_CLINIC_OR_DEPARTMENT_OTHER): Payer: Medicare Other

## 2016-11-24 ENCOUNTER — Other Ambulatory Visit: Payer: Self-pay | Admitting: *Deleted

## 2016-11-24 ENCOUNTER — Ambulatory Visit (HOSPITAL_BASED_OUTPATIENT_CLINIC_OR_DEPARTMENT_OTHER): Payer: Medicare Other

## 2016-11-24 DIAGNOSIS — C50911 Malignant neoplasm of unspecified site of right female breast: Secondary | ICD-10-CM

## 2016-11-24 DIAGNOSIS — C50919 Malignant neoplasm of unspecified site of unspecified female breast: Secondary | ICD-10-CM

## 2016-11-24 DIAGNOSIS — C7951 Secondary malignant neoplasm of bone: Secondary | ICD-10-CM

## 2016-11-24 DIAGNOSIS — Z9889 Other specified postprocedural states: Secondary | ICD-10-CM

## 2016-11-24 LAB — CBC WITH DIFFERENTIAL/PLATELET
BASO%: 0.3 % (ref 0.0–2.0)
Basophils Absolute: 0 10*3/uL (ref 0.0–0.1)
EOS ABS: 0 10*3/uL (ref 0.0–0.5)
EOS%: 1.1 % (ref 0.0–7.0)
HEMATOCRIT: 37.8 % (ref 34.8–46.6)
HEMOGLOBIN: 13 g/dL (ref 11.6–15.9)
LYMPH#: 0.7 10*3/uL — AB (ref 0.9–3.3)
LYMPH%: 19.1 % (ref 14.0–49.7)
MCH: 34.9 pg — ABNORMAL HIGH (ref 25.1–34.0)
MCHC: 34.4 g/dL (ref 31.5–36.0)
MCV: 101.3 fL — AB (ref 79.5–101.0)
MONO#: 0.3 10*3/uL (ref 0.1–0.9)
MONO%: 8.3 % (ref 0.0–14.0)
NEUT%: 71.2 % (ref 38.4–76.8)
NEUTROS ABS: 2.5 10*3/uL (ref 1.5–6.5)
Platelets: 165 10*3/uL (ref 145–400)
RBC: 3.73 10*6/uL (ref 3.70–5.45)
RDW: 16 % — AB (ref 11.2–14.5)
WBC: 3.5 10*3/uL — AB (ref 3.9–10.3)

## 2016-11-24 LAB — COMPREHENSIVE METABOLIC PANEL
ALBUMIN: 4.2 g/dL (ref 3.5–5.0)
ALK PHOS: 82 U/L (ref 40–150)
ALT: 49 U/L (ref 0–55)
AST: 35 U/L — AB (ref 5–34)
Anion Gap: 9 mEq/L (ref 3–11)
BUN: 15 mg/dL (ref 7.0–26.0)
CALCIUM: 10 mg/dL (ref 8.4–10.4)
CO2: 24 mEq/L (ref 22–29)
Chloride: 107 mEq/L (ref 98–109)
Creatinine: 0.8 mg/dL (ref 0.6–1.1)
EGFR: 78 mL/min/{1.73_m2} — ABNORMAL LOW (ref >90)
GLUCOSE: 104 mg/dL (ref 70–140)
Potassium: 3.7 mEq/L (ref 3.5–5.1)
SODIUM: 140 meq/L (ref 136–145)
TOTAL PROTEIN: 7.3 g/dL (ref 6.4–8.3)
Total Bilirubin: 0.83 mg/dL (ref 0.20–1.20)

## 2016-11-24 MED ORDER — ZOLEDRONIC ACID 4 MG/100ML IV SOLN
4.0000 mg | Freq: Once | INTRAVENOUS | Status: AC
  Administered 2016-11-24: 4 mg via INTRAVENOUS
  Filled 2016-11-24: qty 100

## 2016-11-24 MED ORDER — CAPECITABINE 500 MG PO TABS: 1500.0000 mg | ORAL_TABLET | Freq: Two times a day (BID) | ORAL | 0 refills | Status: DC

## 2016-11-24 NOTE — Patient Instructions (Signed)

## 2016-12-06 ENCOUNTER — Other Ambulatory Visit: Payer: Self-pay | Admitting: *Deleted

## 2016-12-06 DIAGNOSIS — C7951 Secondary malignant neoplasm of bone: Secondary | ICD-10-CM

## 2016-12-06 DIAGNOSIS — C50919 Malignant neoplasm of unspecified site of unspecified female breast: Secondary | ICD-10-CM

## 2016-12-06 MED ORDER — HYDROCODONE-ACETAMINOPHEN 7.5-325 MG PO TABS: 1.0000 | ORAL_TABLET | ORAL | 0 refills | Status: DC | PRN

## 2016-12-22 ENCOUNTER — Other Ambulatory Visit (HOSPITAL_BASED_OUTPATIENT_CLINIC_OR_DEPARTMENT_OTHER): Payer: Medicare Other

## 2016-12-22 ENCOUNTER — Telehealth: Payer: Self-pay | Admitting: Oncology

## 2016-12-22 ENCOUNTER — Ambulatory Visit (HOSPITAL_BASED_OUTPATIENT_CLINIC_OR_DEPARTMENT_OTHER): Payer: Medicare Other

## 2016-12-22 ENCOUNTER — Ambulatory Visit (HOSPITAL_BASED_OUTPATIENT_CLINIC_OR_DEPARTMENT_OTHER): Payer: Medicare Other | Admitting: Oncology

## 2016-12-22 ENCOUNTER — Other Ambulatory Visit: Payer: Self-pay | Admitting: *Deleted

## 2016-12-22 VITALS — BP 122/77 | HR 65 | Temp 98.5°F | Resp 18 | Ht 65.0 in | Wt 177.9 lb

## 2016-12-22 DIAGNOSIS — Z853 Personal history of malignant neoplasm of breast: Secondary | ICD-10-CM | POA: Diagnosis not present

## 2016-12-22 DIAGNOSIS — C7951 Secondary malignant neoplasm of bone: Secondary | ICD-10-CM | POA: Diagnosis not present

## 2016-12-22 DIAGNOSIS — C50919 Malignant neoplasm of unspecified site of unspecified female breast: Secondary | ICD-10-CM

## 2016-12-22 DIAGNOSIS — Z9889 Other specified postprocedural states: Secondary | ICD-10-CM

## 2016-12-22 LAB — CBC WITH DIFFERENTIAL/PLATELET
BASO%: 0.3 % (ref 0.0–2.0)
Basophils Absolute: 0 10*3/uL (ref 0.0–0.1)
EOS ABS: 0 10*3/uL (ref 0.0–0.5)
EOS%: 1.1 % (ref 0.0–7.0)
HCT: 41.5 % (ref 34.8–46.6)
HEMOGLOBIN: 14.2 g/dL (ref 11.6–15.9)
LYMPH%: 19.5 % (ref 14.0–49.7)
MCH: 35.3 pg — ABNORMAL HIGH (ref 25.1–34.0)
MCHC: 34.3 g/dL (ref 31.5–36.0)
MCV: 102.8 fL — AB (ref 79.5–101.0)
MONO#: 0.3 10*3/uL (ref 0.1–0.9)
MONO%: 8.4 % (ref 0.0–14.0)
NEUT%: 70.7 % (ref 38.4–76.8)
NEUTROS ABS: 2.5 10*3/uL (ref 1.5–6.5)
Platelets: 173 10*3/uL (ref 145–400)
RBC: 4.03 10*6/uL (ref 3.70–5.45)
RDW: 17.4 % — AB (ref 11.2–14.5)
WBC: 3.6 10*3/uL — AB (ref 3.9–10.3)
lymph#: 0.7 10*3/uL — ABNORMAL LOW (ref 0.9–3.3)

## 2016-12-22 LAB — COMPREHENSIVE METABOLIC PANEL
ALBUMIN: 4.4 g/dL (ref 3.5–5.0)
ALK PHOS: 84 U/L (ref 40–150)
ALT: 47 U/L (ref 0–55)
AST: 35 U/L — AB (ref 5–34)
Anion Gap: 11 mEq/L (ref 3–11)
BILIRUBIN TOTAL: 0.85 mg/dL (ref 0.20–1.20)
BUN: 12.5 mg/dL (ref 7.0–26.0)
CO2: 25 mEq/L (ref 22–29)
CREATININE: 0.7 mg/dL (ref 0.6–1.1)
Calcium: 10.1 mg/dL (ref 8.4–10.4)
Chloride: 106 mEq/L (ref 98–109)
GLUCOSE: 90 mg/dL (ref 70–140)
Potassium: 4 mEq/L (ref 3.5–5.1)
SODIUM: 142 meq/L (ref 136–145)
TOTAL PROTEIN: 7.5 g/dL (ref 6.4–8.3)

## 2016-12-22 MED ORDER — ZOLEDRONIC ACID 4 MG/100ML IV SOLN
4.0000 mg | Freq: Once | INTRAVENOUS | Status: AC
  Administered 2016-12-22: 4 mg via INTRAVENOUS
  Filled 2016-12-22: qty 100

## 2016-12-22 MED ORDER — CAPECITABINE 500 MG PO TABS: 1500.0000 mg | ORAL_TABLET | Freq: Two times a day (BID) | ORAL | 0 refills | Status: DC

## 2016-12-22 NOTE — Telephone Encounter (Signed)
Appointments scheduled per 4.5.18 LOS. Patient given AVS report and calendars with future scheduled appointments. °

## 2016-12-22 NOTE — Patient Instructions (Signed)

## 2016-12-22 NOTE — Progress Notes (Signed)
[facility] OFFICE PROGRESS NOTE   Diagnosis: Breast cancer  INTERVAL HISTORY:   Stacy Horn returns as scheduled. She continues every other week Xeloda. No mouth sores, diarrhea, or hand/foot pain. She continues to take approximately 4 hydrocodone tablets per day for relief of back pain. The rash over the arms and legs is stable. No tooth or jaw pain.  Objective:  Vital signs in last 24 hours:  Blood pressure 122/77, pulse 65, temperature 98.5 F (36.9 C), temperature source Oral, resp. rate 18, height 5' 5" (1.651 m), weight 177 lb 14.4 oz (80.7 kg), last menstrual period 10/08/2014, SpO2 100 %.    HEENT: No thrush or ulcers Resp: Lungs clear bilaterally Cardio: Regular rate and rhythm GI: No hepatosplenomegaly, nontender Vascular: No leg edema  Skin: Palms and soles without erythema, erythematous maculopapular rash at the lower leg bilaterally and to a lesser degree at the forearms     Lab Results:  Lab Results  Component Value Date   WBC 3.6 (L) 12/22/2016   HGB 14.2 12/22/2016   HCT 41.5 12/22/2016   MCV 102.8 (H) 12/22/2016   PLT 173 12/22/2016   NEUTROABS 2.5 12/22/2016     Medications: I have reviewed the patient's current medications.  Assessment/Plan: 1. Multicentric invasive lobular carcinoma of the right breast diagnosed in December of 1999, a right mastectomy followed by adjuvant [facility] chemotherapy, 5 years of tamoxifen, and 5 years of Femara. The Femara was completed in June of 2010. The right breast mass excision in January 2000 was ER positive, PR positive, and HER-2 negative  2. Pain at the left groin area with a CT of the pelvis on 02/07/12 confirming a destructive lytic lesion at the left pubic symphysis, status post a CT-guided biopsy on 02/16/12 confirming metastatic carcinoma, focally ER positive .  -PET scan 03/07/2012 confirmed multiple hypermetabolic bone metastases with no other evidence of metastatic disease  -Status post  palliative radiation to the pelvis beginning on 03/12/2012  -Initiation of Arimidex on 03/27/2012 -initiation of every three-month xgeva on 04/04/2012  -PET scan September 14 [id number] new hypermetabolic bone lesions  -Initiation of Faslodex 09/27/2012  -Restaging PET scan 04/08/2013 with evidence of disease progression in the bones  -Initiation of Aromasin/afinitor 05/29/2013  -[facility]liac [facility] at [facility] 05/22/2013 confirming HER-2 negative metastatic breast  -PET scan 11/18/2013 with a decrease in hypermetabolic activity  -PET scan 10/08/2014 with progressive diffuse hypermetabolic bone metastases -Initiation of Palbociclib 10/20/2014; Faslodex 10/23/2014. -Palbociclib placed on hold beginning 11/07/2014 due to neutropenia. -Palbociclib resumed 11/27/2014 on an every other day schedule -Palbociclib resumed 01/04/2015 at a reduced dose of 100 mg daily for 21 days. -Palbociclib beginning 02/12/2015 at a dose of 100 mg daily for 14 days on/14 days off. -PET scan 05/18/2015 with diffuse hypermetabolic bone lesions, increased metabolic activity compared to the PET scan from January 2016 -Palbociclib resumed 06/07/2015, Faslodex continued 06/12/2015 -PET scan 09/22/2015 with diffuse hypermetabolic bone lesions, increased metabolic activity compared to the PET scan 05/18/2015 -Continuation of palbociclib and Faslodex -palbociclib and Faslodex discontinued April 2017 secondary to a C7 spinous process fracture and hypercalcemia -PET scan [id number] hypermetabolic bone metastases, with increased metabolic activity interpreted as consistent with disease progression - Xeloda (7 days on/7 days off) started 02/28/2016 -PET scan 08/31/2016-marked improvement in hypermetabolic bone activity, no extra osseous disease -Xeloda continued 3. BRCA1 variant felt to be a polymorphism  4. Pain/tenderness at the left low anterolateral chest wall-she completed palliative radiation on 05/28/2012 with  improvement in the pain.  She was also treated with radiation to the thoracic spine.  5. Anemia/leukopenia-likely secondary to breast cancer involving the bone marrow and radiation /afinitor -the anemia is improved  6. History of mildly elevated liver enzymes-? Related to afinitor,? Secondary to metastatic breast cancer 7. Neutropenia secondary to Palbociclib. Improved. 8. Pain at the left shoulder and mid back-a plain x-ray of the left shoulder 06/12/2015 revealed a sclerotic density at the left scapula adjacent to the glenoid, there is metastatic disease involving the humeral head and left scapula on the PET scan 05/18/2015. She completed a course of radiation to the left shoulder on 08/05/2015. 9. Mild hypercalcemia-asymptomatic; Delton See 11/26/2015; persistent/progressive hypercalcemia 12/24/2015. She received Zometa 12/24/2015, she continues monthly Zometa.  10. Low neck/upper back pain 12/24/2015 with MRI showing a mildly displaced acute-appearing C7 spinous process fracture; widespread osseous metastatic disease; no evidence of epidural tumor or spinal cord compression. - Completed palliative radiation to the cervical spine 02/17/2016 11. Dysphagia/odynophagia secondary to radiation-resolved 12. Xeloda-induced skin rash     Disposition:  Stacy Horn appears stable. She will continue every other week Xeloda. She has been maintained on monthly Zometa. There is no clinical evidence for progression of cancer and the calcium is normal. The Zometa will be switch to every 2 months.  She will return for an office visit and Zometa on 02/16/2017. 15 minutes were spent with the patient today. The majority of the time was used for counseling and coordination of care.  Stacy Coder, MD  12/22/2016  10:56 AM

## 2017-01-04 ENCOUNTER — Telehealth: Payer: Self-pay | Admitting: *Deleted

## 2017-01-04 ENCOUNTER — Other Ambulatory Visit: Payer: Self-pay | Admitting: *Deleted

## 2017-01-04 DIAGNOSIS — C7951 Secondary malignant neoplasm of bone: Secondary | ICD-10-CM

## 2017-01-04 DIAGNOSIS — C50919 Malignant neoplasm of unspecified site of unspecified female breast: Secondary | ICD-10-CM

## 2017-01-04 MED ORDER — HYDROCODONE-ACETAMINOPHEN 7.5-325 MG PO TABS: 1.0000 | ORAL_TABLET | ORAL | 0 refills | Status: DC | PRN

## 2017-01-04 NOTE — Telephone Encounter (Signed)
Message received from patient requesting refill for Hydrocodone.  Call placed back to patient to notify her that Hydrocodone prescription is ready for pick up.

## 2017-01-05 DIAGNOSIS — H04123 Dry eye syndrome of bilateral lacrimal glands: Secondary | ICD-10-CM | POA: Diagnosis not present

## 2017-01-05 DIAGNOSIS — H2513 Age-related nuclear cataract, bilateral: Secondary | ICD-10-CM | POA: Diagnosis not present

## 2017-01-05 DIAGNOSIS — H31002 Unspecified chorioretinal scars, left eye: Secondary | ICD-10-CM | POA: Diagnosis not present

## 2017-01-05 DIAGNOSIS — H25013 Cortical age-related cataract, bilateral: Secondary | ICD-10-CM | POA: Diagnosis not present

## 2017-01-23 ENCOUNTER — Other Ambulatory Visit: Payer: Self-pay | Admitting: *Deleted

## 2017-01-23 DIAGNOSIS — C7951 Secondary malignant neoplasm of bone: Secondary | ICD-10-CM

## 2017-01-23 MED ORDER — CAPECITABINE 500 MG PO TABS: 1500.0000 mg | ORAL_TABLET | Freq: Two times a day (BID) | ORAL | 0 refills | Status: DC

## 2017-02-02 ENCOUNTER — Telehealth: Payer: Self-pay | Admitting: *Deleted

## 2017-02-02 DIAGNOSIS — C50919 Malignant neoplasm of unspecified site of unspecified female breast: Secondary | ICD-10-CM

## 2017-02-02 DIAGNOSIS — C7951 Secondary malignant neoplasm of bone: Secondary | ICD-10-CM

## 2017-02-02 MED ORDER — HYDROCODONE-ACETAMINOPHEN 7.5-325 MG PO TABS: 1.0000 | ORAL_TABLET | ORAL | 0 refills | Status: DC | PRN

## 2017-02-02 NOTE — Telephone Encounter (Signed)
Message from pt requesting refill of Hydrocodone. Informed her she may pick it up between 0830 and 1600. She voiced understanding.

## 2017-02-16 ENCOUNTER — Other Ambulatory Visit (HOSPITAL_BASED_OUTPATIENT_CLINIC_OR_DEPARTMENT_OTHER): Payer: Medicare Other

## 2017-02-16 ENCOUNTER — Ambulatory Visit (HOSPITAL_BASED_OUTPATIENT_CLINIC_OR_DEPARTMENT_OTHER): Payer: Medicare Other

## 2017-02-16 ENCOUNTER — Other Ambulatory Visit: Payer: Self-pay | Admitting: *Deleted

## 2017-02-16 ENCOUNTER — Telehealth: Payer: Self-pay | Admitting: Oncology

## 2017-02-16 ENCOUNTER — Ambulatory Visit (HOSPITAL_BASED_OUTPATIENT_CLINIC_OR_DEPARTMENT_OTHER): Payer: Medicare Other | Admitting: Oncology

## 2017-02-16 VITALS — BP 121/59 | HR 74 | Temp 98.0°F | Resp 18 | Ht 65.0 in | Wt 180.4 lb

## 2017-02-16 DIAGNOSIS — C50919 Malignant neoplasm of unspecified site of unspecified female breast: Secondary | ICD-10-CM

## 2017-02-16 DIAGNOSIS — Z853 Personal history of malignant neoplasm of breast: Secondary | ICD-10-CM

## 2017-02-16 DIAGNOSIS — Z9889 Other specified postprocedural states: Secondary | ICD-10-CM

## 2017-02-16 DIAGNOSIS — C7951 Secondary malignant neoplasm of bone: Secondary | ICD-10-CM | POA: Diagnosis not present

## 2017-02-16 LAB — CBC WITH DIFFERENTIAL/PLATELET
BASO%: 0.3 % (ref 0.0–2.0)
Basophils Absolute: 0 10*3/uL (ref 0.0–0.1)
EOS%: 1.3 % (ref 0.0–7.0)
Eosinophils Absolute: 0 10*3/uL (ref 0.0–0.5)
HEMATOCRIT: 39.5 % (ref 34.8–46.6)
HGB: 13.6 g/dL (ref 11.6–15.9)
LYMPH#: 0.7 10*3/uL — AB (ref 0.9–3.3)
LYMPH%: 22.8 % (ref 14.0–49.7)
MCH: 35.6 pg — ABNORMAL HIGH (ref 25.1–34.0)
MCHC: 34.4 g/dL (ref 31.5–36.0)
MCV: 103.4 fL — ABNORMAL HIGH (ref 79.5–101.0)
MONO#: 0.3 10*3/uL (ref 0.1–0.9)
MONO%: 9.3 % (ref 0.0–14.0)
NEUT#: 2.1 10*3/uL (ref 1.5–6.5)
NEUT%: 66.3 % (ref 38.4–76.8)
Platelets: 160 10*3/uL (ref 145–400)
RBC: 3.82 10*6/uL (ref 3.70–5.45)
RDW: 15.7 % — AB (ref 11.2–14.5)
WBC: 3.1 10*3/uL — ABNORMAL LOW (ref 3.9–10.3)

## 2017-02-16 LAB — COMPREHENSIVE METABOLIC PANEL
ALBUMIN: 4.2 g/dL (ref 3.5–5.0)
ALT: 48 U/L (ref 0–55)
ANION GAP: 11 meq/L (ref 3–11)
AST: 38 U/L — ABNORMAL HIGH (ref 5–34)
Alkaline Phosphatase: 87 U/L (ref 40–150)
BILIRUBIN TOTAL: 0.83 mg/dL (ref 0.20–1.20)
BUN: 13.2 mg/dL (ref 7.0–26.0)
CO2: 24 mEq/L (ref 22–29)
CREATININE: 0.8 mg/dL (ref 0.6–1.1)
Calcium: 9.9 mg/dL (ref 8.4–10.4)
Chloride: 108 mEq/L (ref 98–109)
EGFR: 74 mL/min/{1.73_m2} — AB (ref >90)
GLUCOSE: 117 mg/dL (ref 70–140)
Potassium: 3.7 mEq/L (ref 3.5–5.1)
Sodium: 143 mEq/L (ref 136–145)
Total Protein: 7.1 g/dL (ref 6.4–8.3)

## 2017-02-16 MED ORDER — ZOLEDRONIC ACID 4 MG/100ML IV SOLN
4.0000 mg | Freq: Once | INTRAVENOUS | Status: AC
  Administered 2017-02-16: 4 mg via INTRAVENOUS
  Filled 2017-02-16: qty 100

## 2017-02-16 MED ORDER — CAPECITABINE 500 MG PO TABS: 1500.0000 mg | ORAL_TABLET | Freq: Two times a day (BID) | ORAL | 0 refills | Status: DC

## 2017-02-16 NOTE — Progress Notes (Signed)
[place] OFFICE PROGRESS NOTE   Diagnosis: Breast cancer  INTERVAL HISTORY:   Ms. Horn returns as scheduled. She continues every other week Xeloda. No mouth ulcers or diarrhea. The rash over the legs has improved. She continues to take approximate 4 hydrocodone tablet per day for relief of bone pain. She recently developed acute pain at the left low anterolateral chest wall after bending over. This has improved. No jaw or tooth pain. She is now maintained on every 2 month Zometa.  Objective:  Vital signs in last 24 hours:  Blood pressure (!) 121/59, pulse 74, temperature 98 F (36.7 C), temperature source Oral, resp. rate 18, height '5\' 5"'  (1.651 m), weight 180 lb 6.4 oz (81.8 kg), last menstrual period 10/08/2014, SpO2 99 %.    HEENT: No thrush or ulcers Lymphatics: Pea-sized mobile left axillary node. No cervical or supraclavicular nodes Resp: Lungs clear bilaterally Cardio: Regular rate and rhythm GI: No hepatosplenomegaly Vascular: No leg edema  Skin: Erythematous maculopapular rash at the lower leg bilaterally. Similar, though mild rash at the forearms bilaterally Musculoskeletal: Tender at the left low anterior ribs     Lab Results:  Lab Results  Component Value Date   WBC 3.1 (L) 02/16/2017   HGB 13.6 02/16/2017   HCT 39.5 02/16/2017   MCV 103.4 (H) 02/16/2017   PLT 160 02/16/2017   NEUTROABS 2.1 02/16/2017    CMP     Component Value Date/Time   NA 143 02/16/2017 1013   K 3.7 02/16/2017 1013   CL 107 12/20/2012 0927   CO2 24 02/16/2017 1013   GLUCOSE 117 02/16/2017 1013   GLUCOSE 89 12/20/2012 0927   BUN 13.2 02/16/2017 1013   CREATININE 0.8 02/16/2017 1013   CALCIUM 9.9 02/16/2017 1013   PROT 7.1 02/16/2017 1013   ALBUMIN 4.2 02/16/2017 1013   AST 38 (H) 02/16/2017 1013   ALT 48 02/16/2017 1013   ALKPHOS 87 02/16/2017 1013   BILITOT 0.83 02/16/2017 1013    No results found for: CEA1  No results found for:  INR  Imaging:  No results found.  Medications: I have reviewed the patient's current medications.  Assessment/Plan: 1. Multicentric invasive lobular carcinoma of the right breast diagnosed in December of 1999, a right mastectomy followed by adjuvant [facility] chemotherapy, 5 years of tamoxifen, and 5 years of Femara. The Femara was completed in June of 2010. The right breast mass excision in January 2000 was ER positive, PR positive, and HER-2 negative  2. Pain at the left groin area with a CT of the pelvis on 02/07/12 confirming a destructive lytic lesion at the left pubic symphysis, status post a CT-guided biopsy on 02/16/12 confirming metastatic carcinoma, focally ER positive .  -PET scan 03/07/2012 confirmed multiple hypermetabolic bone metastases with no other evidence of metastatic disease  -Status post palliative radiation to the pelvis beginning on 03/12/2012  -Initiation of Arimidex on 03/27/2012 -initiation of every three-month xgeva on 04/04/2012  -PET scan September 14 [id number] new hypermetabolic bone lesions  -Initiation of Faslodex 09/27/2012  -Restaging PET scan 04/08/2013 with evidence of disease progression in the bones  -Initiation of Aromasin/afinitor 05/29/2013  -Iliac biopsy at [facility] 05/22/2013 confirming HER-2 negative metastatic breast  -PET scan 11/18/2013 with a decrease in hypermetabolic activity  -PET scan 10/08/2014 with progressive diffuse hypermetabolic bone metastases -Initiation of Palbociclib 10/20/2014; Faslodex 10/23/2014. -Palbociclib placed on hold beginning 11/07/2014 due to neutropenia. -Palbociclib resumed 11/27/2014 on an every other day schedule -Palbociclib resumed 01/04/2015 at  a reduced dose of 100 mg daily for 21 days. -Palbociclib beginning 02/12/2015 at a dose of 100 mg daily for 14 days on/14 days off. -PET scan 05/18/2015 with diffuse hypermetabolic bone lesions, increased metabolic activity compared to the PET scan from January  2016 -Palbociclib resumed 06/07/2015, Faslodex continued 06/12/2015 -PET scan 09/22/2015 with diffuse hypermetabolic bone lesions, increased metabolic activity compared to the PET scan 05/18/2015 -Continuation of palbociclib and Faslodex -palbociclib and Faslodex discontinued April 2017 secondary to a C7 spinous process fracture and hypercalcemia -PET scan [id number] hypermetabolic bone metastases, with increased metabolic activity interpreted as consistent with disease progression - Xeloda (7 days on/7 days off) started 02/28/2016 -PET scan 08/31/2016-marked improvement in hypermetabolic bone activity, no extra osseous disease -Xeloda continued 3. BRCA1 variant felt to be a polymorphism  4. Pain/tenderness at the left low anterolateral chest wall-she completed palliative radiation on 05/28/2012 with improvement in the pain. She was also treated with radiation to the thoracic spine.  5. Anemia/leukopenia-likely secondary to breast cancer involving the bone marrow and radiation /afinitor -the anemia is improved  6. History of mildly elevated liver enzymes-? Related to afinitor,? Secondary to metastatic breast cancer 7. Neutropenia secondary to Palbociclib. Improved. 8. Pain at the left shoulder and mid back-a plain x-ray of the left shoulder 06/12/2015 revealed a sclerotic density at the left scapula adjacent to the glenoid, there is metastatic disease involving the humeral head and left scapula on the PET scan 05/18/2015. She completed a course of radiation to the left shoulder on 08/05/2015. 9. Mild hypercalcemia-asymptomatic; Delton See 11/26/2015; persistent/progressive hypercalcemia 12/24/2015. She received Zometa 12/24/2015, she continues monthly Zometa.  10. Low neck/upper back pain 12/24/2015 with MRI showing a mildly displaced acute-appearing C7 spinous process fracture; widespread osseous metastatic disease; no evidence of epidural tumor or spinal cord compression. - Completed  palliative radiation to the cervical spine 02/17/2016 11. Dysphagia/odynophagia secondary to radiation-resolved 12. Xeloda-induced skin rash   Disposition:  She appears unchanged. She is tolerating the Xeloda well. The plan is to continue Xeloda on a 7 day on/7 day off schedule. She will continue every 2 month Zometa.  Mrs. Stacy Horn will return for an office and lab visit in 2 months. I suspect the left chest wall discomfort is related to a benign musculoskeletal condition or possibly an injury to the left-sided ribs in the setting of metastatic disease. She will contact us if the pain progresses. The plan is to obtain a restaging PET scan at a 9 month interval.  15 minutes were spent with the patient today. The majority of the time was used for counseling and coordination of care.  Donneta Romberg, MD  02/16/2017  10:59 AM

## 2017-02-16 NOTE — Telephone Encounter (Signed)
Appointments scheduled per 05/31/1/8 los. Patient was given a copy of the AVS report and appointment schedule per 02/16/17 los.

## 2017-02-16 NOTE — Patient Instructions (Signed)

## 2017-03-06 ENCOUNTER — Telehealth: Payer: Self-pay

## 2017-03-06 DIAGNOSIS — C50919 Malignant neoplasm of unspecified site of unspecified female breast: Secondary | ICD-10-CM

## 2017-03-06 DIAGNOSIS — C7951 Secondary malignant neoplasm of bone: Secondary | ICD-10-CM

## 2017-03-06 MED ORDER — HYDROCODONE-ACETAMINOPHEN 7.5-325 MG PO TABS: 1.0000 | ORAL_TABLET | ORAL | 0 refills | Status: DC | PRN

## 2017-03-06 NOTE — Telephone Encounter (Signed)
Pt called for pain medication refill.  Norco rx prepared for signature.

## 2017-03-06 NOTE — Telephone Encounter (Signed)
Telephone call to patient to advise medication refill is ready to be picked up at [facility] front desk.

## 2017-03-17 ENCOUNTER — Telehealth: Payer: Self-pay

## 2017-03-17 ENCOUNTER — Telehealth: Payer: Self-pay | Admitting: *Deleted

## 2017-03-17 NOTE — Telephone Encounter (Signed)
Pt called with pain in left upper inner leg in groin area for more than a week, it fluctuates in intensity, it can be shooting down the upper leg. It can be fine and suddenly grab. She cannot remember any event, it started as a twinge and has gotten worse. She has tried pain pills, aspercreme with lidocaine, she has used her mother's walker to decrease weightbearing. Uses ice. These all help a little but not completely.  She is unsure if it is metastatic or musculoskeletal. Can she be seen on Monday or Tuesday? Or what should she do? She does not have PCP at present.  She is on xeloda Next ov 7/26

## 2017-03-17 NOTE — Telephone Encounter (Signed)
Discussed pt's call with Ned Card, NP: Schedule with symptom management APP early next week. Called pt with appointment for 7/3. She voiced appreciation for call.

## 2017-03-21 ENCOUNTER — Encounter: Payer: Self-pay | Admitting: Oncology

## 2017-03-21 ENCOUNTER — Other Ambulatory Visit: Payer: Self-pay

## 2017-03-21 ENCOUNTER — Ambulatory Visit (HOSPITAL_COMMUNITY)
Admission: RE | Admit: 2017-03-21 | Discharge: 2017-03-21 | Disposition: A | Discharge status: Home / Self Care | Payer: Medicare Other | Source: Ambulatory Visit | Attending: Oncology | Admitting: Oncology

## 2017-03-21 ENCOUNTER — Ambulatory Visit (HOSPITAL_BASED_OUTPATIENT_CLINIC_OR_DEPARTMENT_OTHER): Payer: Medicare Other | Admitting: Oncology

## 2017-03-21 VITALS — BP 133/73 | HR 87 | Temp 98.4°F | Resp 20 | Ht 65.0 in | Wt 180.0 lb

## 2017-03-21 DIAGNOSIS — Z853 Personal history of malignant neoplasm of breast: Secondary | ICD-10-CM

## 2017-03-21 DIAGNOSIS — C7951 Secondary malignant neoplasm of bone: Secondary | ICD-10-CM | POA: Diagnosis not present

## 2017-03-21 DIAGNOSIS — C50919 Malignant neoplasm of unspecified site of unspecified female breast: Secondary | ICD-10-CM

## 2017-03-21 DIAGNOSIS — R1030 Lower abdominal pain, unspecified: Secondary | ICD-10-CM

## 2017-03-21 DIAGNOSIS — M25552 Pain in left hip: Secondary | ICD-10-CM

## 2017-03-21 MED ORDER — CAPECITABINE 500 MG PO TABS: ORAL_TABLET | ORAL | 0 refills | Status: DC

## 2017-03-21 NOTE — Progress Notes (Signed)
[facility]    Chief Complaint: Left groin pain  HPI:  Stacy Horn 66 y.o. female diagnosed with metastatic breast cancer who has been on treatment with Xeloda (7 days on/7 days off) since June 2017. Patient presents today with pain to her left groin. The patient reported that this pain started about 2 weeks ago and has progressively gotten worse. She contact our office at the end of last week to report the pain and was instructed to limit her weightbearing on that side. The patient reports that the pain is on her left inner upper leg and groin area. At times, the pain shoots down into her leg to her knee. The patient was initially taking hydrocodone without much relief. However, since she has been limiting the amount of weight she has been putting on her leg, the pain is improving. She is now taking hydrocodone 2 tablets about every 5 hours which helps the pain. She denies any recent injury or twisting of her leg. Denies any weakness to her lower extremities. She continues tolerate her Xeloda well. She is off the Xeloda this week and due to restart this weekend.   No history exists.    Review of Systems  Constitutional: Negative.   HENT: Negative.   Eyes: Negative.   Respiratory: Negative.   Cardiovascular: Negative.   Gastrointestinal: Negative.   Genitourinary: Negative.   Musculoskeletal: Positive for joint pain. Negative for falls.       Left groin pain as noted in the history of present illness.  Skin: Positive for rash.       Erythematous maculopapular rash to her bilateral lower extremities which has been present since starting Xeloda. The rash has not worsened.  Neurological: Negative.   Endo/Heme/Allergies: Negative.   Psychiatric/Behavioral: Negative.     Past Medical History:  Diagnosis Date  . Breast cancer metastasized to bone ([place]) 04/04/2012  . History of chemotherapy    adjuvant AC, Tamoxifen, Femara-completed 02/2009  . History of rib fracture      3 ribs, right side  . Hx of breast biopsy 08/19/1992   right breast-lobular carcinoma in-situ  . Hx of breast biopsy 08/20/1998   right breast-malignant cells  . Osteopenia     Past Surgical History:  Procedure Laterality Date  . BONE BIOPSY  02/16/12   pelvic- metastatic carcinoma, ER+  . MASTECTOMY  10/07/1998   bilateral, R-inv carcinoma, L- fibroadenoma, ER/PR +, HER2 -    has Hx of breast biopsy; Breast cancer metastasized to bone ([place]); Bone metastases ([place]); Breast CA ([place]); CA skin, basal cell; H/O Spinal surgery; and Hypercalcemia on her problem list.    is allergic to amoxicillin and penicillins.  Allergies as of 03/21/2017      Reactions   Amoxicillin Rash   Penicillins Rash      Medication List       Accurate as of 03/21/17  9:54 AM. Always use your most recent med list.          BIOTIN PO Take 1 tablet by mouth daily.   capecitabine 500 MG tablet Commonly known as:  XELODA Take 3 tablets (1,500 mg total) by mouth 2 (two) times daily after a meal. Start on 01/29/17. Take on days 1-7 and 15-21 of a 28 day cycle.   CENTRUM SILVER PO Take 1 tablet by mouth daily.   Fish Oil 1200 MG Caps Take 1,200 mg by mouth 2 (two) times daily.   HYDROcodone-acetaminophen 7.5-325 MG tablet Commonly known  as:  NORCO Take 1-2 tablets by mouth every 4 (four) hours as needed for moderate pain.   loratadine 10 MG tablet Commonly known as:  CLARITIN Take 10 mg by mouth daily.   pyridOXINE 100 MG tablet Commonly known as:  VITAMIN B-6 Take 100 mg by mouth 2 (two) times daily.   rizatriptan 10 MG tablet Commonly known as:  MAXALT Take 10 mg by mouth as needed. May repeat in 2 hours if needed   sennosides-docusate sodium 8.6-50 MG tablet Commonly known as:  SENOKOT-S Take 2 tablets by mouth as needed for constipation (2 once a day occasionaly for constipation).   triamcinolone cream 0.1 % Commonly known as:  KENALOG Apply 1 application topically 2 (two) times daily.         PHYSICAL EXAMINATION  Oncology Vitals 03/21/2017 02/16/2017  Height 165 cm 165 cm  Weight 81.647 kg 81.829 kg  Weight (lbs) 180 lbs 180 lbs 6 oz  BMI (kg/m2) 29.95 kg/m2 30.02 kg/m2  Temp 98.4 98  Pulse 87 74  Resp 20 18  Resp (Historical as of 04/19/12) - -  SpO2 98 99  BSA (m2) 1.93 m2 1.94 m2   BP Readings from Last 2 Encounters:  03/21/17 133/73  02/16/17 (!) 121/59    Physical Exam  Constitutional: She is oriented to person, place, and time and well-developed, well-nourished, and in no distress. No distress.  HENT:  Head: Normocephalic and atraumatic.  Eyes: Right eye exhibits no discharge. Left eye exhibits no discharge. No scleral icterus.  Neck: Normal range of motion.  Musculoskeletal: Normal range of motion. She exhibits no edema, tenderness or deformity.  Strength 5/5 in the bilateral lower extremities.  Neurological: She is alert and oriented to person, place, and time. She has normal reflexes. She displays normal reflexes. No cranial nerve deficit. She exhibits normal muscle tone. Coordination normal.  Skin: She is not diaphoretic.  Erythematous maculopapular rash to the bilateral lower extremities  Psychiatric: Mood, memory, affect and judgment normal.  Vitals reviewed.   LABORATORY DATA:. No visits with results within 3 Day(s) from this visit.  Latest known visit with results is:  Appointment on 02/16/2017  Component Date Value Ref Range Status  . WBC 02/16/2017 3.1* 3.9 - 10.3 10e3/uL Final  . NEUT# 02/16/2017 2.1  1.5 - 6.5 10e3/uL Final  . HGB 02/16/2017 13.6  11.6 - 15.9 g/dL Final  . HCT 02/16/2017 39.5  34.8 - 46.6 % Final  . Platelets 02/16/2017 160  145 - 400 10e3/uL Final  . MCV 02/16/2017 103.4* 79.5 - 101.0 fL Final  . MCH 02/16/2017 35.6* 25.1 - 34.0 pg Final  . MCHC 02/16/2017 34.4  31.5 - 36.0 g/dL Final  . RBC 02/16/2017 3.82  3.70 - 5.45 10e6/uL Final  . RDW 02/16/2017 15.7* 11.2 - 14.5 % Final  . lymph# 02/16/2017 0.7* 0.9 - 3.3  10e3/uL Final  . MONO# 02/16/2017 0.3  0.1 - 0.9 10e3/uL Final  . Eosinophils Absolute 02/16/2017 0.0  0.0 - 0.5 10e3/uL Final  . Basophils Absolute 02/16/2017 0.0  0.0 - 0.1 10e3/uL Final  . NEUT% 02/16/2017 66.3  38.4 - 76.8 % Final  . LYMPH% 02/16/2017 22.8  14.0 - 49.7 % Final  . MONO% 02/16/2017 9.3  0.0 - 14.0 % Final  . EOS% 02/16/2017 1.3  0.0 - 7.0 % Final  . BASO% 02/16/2017 0.3  0.0 - 2.0 % Final  . Sodium 02/16/2017 143  136 - 145 mEq/L Final  . Potassium 02/16/2017  3.7  3.5 - 5.1 mEq/L Final  . Chloride 02/16/2017 108  98 - 109 mEq/L Final  . CO2 02/16/2017 24  22 - 29 mEq/L Final  . Glucose 02/16/2017 117  70 - 140 mg/dl Final   Glucose reference range is for nonfasting patients. Fasting glucose reference range is 70- 100.  Marland Kitchen BUN 02/16/2017 13.2  7.0 - 26.0 mg/dL Final  . Creatinine 02/16/2017 0.8  0.6 - 1.1 mg/dL Final  . Total Bilirubin 02/16/2017 0.83  0.20 - 1.20 mg/dL Final  . Alkaline Phosphatase 02/16/2017 87  40 - 150 U/L Final  . AST 02/16/2017 38* 5 - 34 U/L Final  . ALT 02/16/2017 48  0 - 55 U/L Final  . Total Protein 02/16/2017 7.1  6.4 - 8.3 g/dL Final  . Albumin 02/16/2017 4.2  3.5 - 5.0 g/dL Final  . Calcium 02/16/2017 9.9  8.4 - 10.4 mg/dL Final  . Anion Gap 02/16/2017 11  3 - 11 mEq/L Final  . EGFR 02/16/2017 74* >90 ml/min/1.73 m2 Final   eGFR is calculated using the CKD-EPI Creatinine Equation (2009)    RADIOGRAPHIC STUDIES: No results found.  ASSESSMENT/PLAN:    No problem-specific Assessment & Plan notes found for this encounter.  This is a 66 year old female with metastatic breast cancer presents today with left groin pain. Pain has improved since she has been nonweightbearing on the left leg. She is using hydrocodone 2 tablets every 5 hours which is effective. She does not think that she is anything stronger for her pain at this point in time. We will get an x-ray of her left hip tonight he wait for fracture versus worsening bone metastasis. If  there is any evidence of fracture or an impending fracture, we will refer her to orthopedics. If she has evidence of worsening bone metastasis, we will refer her to radiation oncology for consideration of radiation to this area.  We will call the patient with the results of her x-ray once available.  Patient stated understanding of all instructions; and was in agreement with this plan of care. The patient knows to call the clinic with any problems, questions or concerns.   Plan reviewed with Dr. Benay Spice.   Mikey Bussing, NP 03/21/2017    Addendum: Left hip x-ray report shows no impending fracture. Scattered sclerotic foci consistent with previously demonstrated diffuse bone metastases. X-ray report called to the patient. Encouraged to continue pain medication and limit weightbearing to the left leg. She was notified to call our office if her pain worsens and will consider an MRI.

## 2017-04-03 ENCOUNTER — Other Ambulatory Visit: Payer: Self-pay | Admitting: *Deleted

## 2017-04-03 DIAGNOSIS — C7951 Secondary malignant neoplasm of bone: Secondary | ICD-10-CM

## 2017-04-03 DIAGNOSIS — C50919 Malignant neoplasm of unspecified site of unspecified female breast: Secondary | ICD-10-CM

## 2017-04-03 MED ORDER — HYDROCODONE-ACETAMINOPHEN 7.5-325 MG PO TABS: 1.0000 | ORAL_TABLET | ORAL | 0 refills | Status: DC | PRN

## 2017-04-03 NOTE — Telephone Encounter (Signed)
Call from pt requesting refill of Hydrocodone. Husband will pick up on 7/17.

## 2017-04-13 ENCOUNTER — Other Ambulatory Visit: Payer: Self-pay

## 2017-04-13 ENCOUNTER — Other Ambulatory Visit (HOSPITAL_BASED_OUTPATIENT_CLINIC_OR_DEPARTMENT_OTHER): Payer: Medicare Other

## 2017-04-13 ENCOUNTER — Ambulatory Visit (HOSPITAL_BASED_OUTPATIENT_CLINIC_OR_DEPARTMENT_OTHER): Payer: Medicare Other | Admitting: Oncology

## 2017-04-13 ENCOUNTER — Ambulatory Visit (HOSPITAL_BASED_OUTPATIENT_CLINIC_OR_DEPARTMENT_OTHER): Payer: Medicare Other

## 2017-04-13 VITALS — BP 117/62 | HR 64 | Temp 98.4°F | Resp 18 | Ht 65.0 in | Wt 179.0 lb

## 2017-04-13 DIAGNOSIS — Z853 Personal history of malignant neoplasm of breast: Secondary | ICD-10-CM | POA: Diagnosis not present

## 2017-04-13 DIAGNOSIS — C7951 Secondary malignant neoplasm of bone: Secondary | ICD-10-CM

## 2017-04-13 DIAGNOSIS — C50919 Malignant neoplasm of unspecified site of unspecified female breast: Secondary | ICD-10-CM

## 2017-04-13 DIAGNOSIS — Z9889 Other specified postprocedural states: Secondary | ICD-10-CM

## 2017-04-13 LAB — COMPREHENSIVE METABOLIC PANEL
ALT: 65 U/L — ABNORMAL HIGH (ref 0–55)
ANION GAP: 9 meq/L (ref 3–11)
AST: 55 U/L — ABNORMAL HIGH (ref 5–34)
Albumin: 4.3 g/dL (ref 3.5–5.0)
Alkaline Phosphatase: 101 U/L (ref 40–150)
BUN: 16.7 mg/dL (ref 7.0–26.0)
CALCIUM: 10.8 mg/dL — AB (ref 8.4–10.4)
CHLORIDE: 106 meq/L (ref 98–109)
CO2: 26 mEq/L (ref 22–29)
CREATININE: 0.7 mg/dL (ref 0.6–1.1)
EGFR: 86 mL/min/{1.73_m2} — ABNORMAL LOW (ref >90)
Glucose: 95 mg/dl (ref 70–140)
Potassium: 4.2 mEq/L (ref 3.5–5.1)
Sodium: 140 mEq/L (ref 136–145)
Total Bilirubin: 0.81 mg/dL (ref 0.20–1.20)
Total Protein: 7.2 g/dL (ref 6.4–8.3)

## 2017-04-13 LAB — CBC WITH DIFFERENTIAL/PLATELET
BASO%: 0.4 % (ref 0.0–2.0)
BASOS ABS: 0 10*3/uL (ref 0.0–0.1)
EOS%: 1.4 % (ref 0.0–7.0)
Eosinophils Absolute: 0.1 10*3/uL (ref 0.0–0.5)
HEMATOCRIT: 40.5 % (ref 34.8–46.6)
HGB: 13.8 g/dL (ref 11.6–15.9)
LYMPH#: 0.8 10*3/uL — AB (ref 0.9–3.3)
LYMPH%: 19.9 % (ref 14.0–49.7)
MCH: 35.8 pg — AB (ref 25.1–34.0)
MCHC: 34 g/dL (ref 31.5–36.0)
MCV: 105.3 fL — ABNORMAL HIGH (ref 79.5–101.0)
MONO#: 0.3 10*3/uL (ref 0.1–0.9)
MONO%: 9.1 % (ref 0.0–14.0)
NEUT#: 2.6 10*3/uL (ref 1.5–6.5)
NEUT%: 69.2 % (ref 38.4–76.8)
PLATELETS: 166 10*3/uL (ref 145–400)
RBC: 3.84 10*6/uL (ref 3.70–5.45)
RDW: 16.7 % — ABNORMAL HIGH (ref 11.2–14.5)
WBC: 3.8 10*3/uL — ABNORMAL LOW (ref 3.9–10.3)

## 2017-04-13 MED ORDER — SODIUM CHLORIDE 0.9 % IV SOLN
INTRAVENOUS | Status: DC
  Administered 2017-04-13: 10:00:00 via INTRAVENOUS

## 2017-04-13 MED ORDER — ZOLEDRONIC ACID 4 MG/100ML IV SOLN
4.0000 mg | Freq: Once | INTRAVENOUS | Status: AC
  Administered 2017-04-13: 4 mg via INTRAVENOUS
  Filled 2017-04-13: qty 100

## 2017-04-13 MED ORDER — CAPECITABINE 500 MG PO TABS: ORAL_TABLET | ORAL | 0 refills | Status: DC

## 2017-04-13 NOTE — Progress Notes (Signed)
[facility] OFFICE PROGRESS NOTE   Diagnosis: Breast cancer  INTERVAL HISTORY:   Stacy Horn returns as scheduled. She continues every other week Xeloda. She had increased pain at the left groin several weeks ago. She saw Mikey Bussing. A plain x-ray showed no fracture or impending fracture. She reports this pain has improved. She had an acute flare of lower back pain earlier this week. This has also improved. She continues hydrocodone as needed for pain. The skin rash has improved. No diarrhea.  Objective:  Vital signs in last 24 hours:  Blood pressure 117/62, pulse 64, temperature 98.4 F (36.9 C), temperature source Oral, resp. rate 18, height '5\' 5"'  (1.651 m), weight 179 lb (81.2 kg), last menstrual period 10/08/2014, SpO2 98 %.    HEENT: 1 cm area of erythema at the left posterior buccal mucosa Resp: Lungs clear bilaterally Cardio: Regular rate and rhythm GI: No hepatomegaly, nontender Vascular: No leg edema Musculoskeletal: Mild discomfort with internal rotation of the left hip  Skin: Mild erythematous rash at the lower leg bilaterally     Lab Results:  Lab Results  Component Value Date   WBC 3.8 (L) 04/13/2017   HGB 13.8 04/13/2017   HCT 40.5 04/13/2017   MCV 105.3 (H) 04/13/2017   PLT 166 04/13/2017   NEUTROABS 2.6 04/13/2017    CMP     Component Value Date/Time   NA 140 04/13/2017 0810   K 4.2 04/13/2017 0810   CL 107 12/20/2012 0927   CO2 26 04/13/2017 0810   GLUCOSE 95 04/13/2017 0810   GLUCOSE 89 12/20/2012 0927   BUN 16.7 04/13/2017 0810   CREATININE 0.7 04/13/2017 0810   CALCIUM 10.8 (H) 04/13/2017 0810   PROT 7.2 04/13/2017 0810   ALBUMIN 4.3 04/13/2017 0810   AST 55 (H) 04/13/2017 0810   ALT 65 (H) 04/13/2017 0810   ALKPHOS 101 04/13/2017 0810   BILITOT 0.81 04/13/2017 0810    No results found for: CEA1  No results found for: INR  Imaging:  No results found.  Medications: I have reviewed the patient's current  medications.  Assessment/Plan: 1. Multicentric invasive lobular carcinoma of the right breast diagnosed in December of 1999, a right mastectomy followed by adjuvant [facility] chemotherapy, 5 years of tamoxifen, and 5 years of Femara. The Femara was completed in June of 2010. The right breast mass excision in January 2000 was ER positive, PR positive, and HER-2 negative  2. Pain at the left groin area with a CT of the pelvis on 02/07/12 confirming a destructive lytic lesion at the left pubic symphysis, status post a CT-guided biopsy on 02/16/12 confirming metastatic carcinoma, focally ER positive .  -PET scan 03/07/2012 confirmed multiple hypermetabolic bone metastases with no other evidence of metastatic disease  -Status post palliative radiation to the pelvis beginning on 03/12/2012  -Initiation of Arimidex on 03/27/2012 -initiation of every three-month xgeva on 04/04/2012  -PET scan September 14 [id number] new hypermetabolic bone lesions  -Initiation of Faslodex 09/27/2012  -Restaging PET scan 04/08/2013 with evidence of disease progression in the bones  -Initiation of Aromasin/afinitor 05/29/2013  -Iliac biopsy at [facility] 05/22/2013 confirming HER-2 negative metastatic breast  -PET scan 11/18/2013 with a decrease in hypermetabolic activity  -PET scan 10/08/2014 with progressive diffuse hypermetabolic bone metastases -Initiation of Palbociclib 10/20/2014; Faslodex 10/23/2014. -Palbociclib placed on hold beginning 11/07/2014 due to neutropenia. -Palbociclib resumed 11/27/2014 on an every other day schedule -Palbociclib resumed 01/04/2015 at a reduced dose of 100 mg daily for 21  days. -Palbociclib beginning 02/12/2015 at a dose of 100 mg daily for 14 days on/14 days off. -PET scan 05/18/2015 with diffuse hypermetabolic bone lesions, increased metabolic activity compared to the PET scan from January 2016 -Palbociclib resumed 06/07/2015, Faslodex continued 06/12/2015 -PET scan 09/22/2015 with  diffuse hypermetabolic bone lesions, increased metabolic activity compared to the PET scan 05/18/2015 -Continuation of palbociclib and Faslodex -palbociclib and Faslodex discontinued April 2017 secondary to a C7 spinous process fracture and hypercalcemia -PET scan [id number]/40/[id number] hypermetabolic bone metastases, with increased metabolic activity interpreted as consistent with disease progression - Xeloda (7 days on/7 days off) started 02/28/2016 -PET scan 08/31/2016-marked improvement in hypermetabolic bone activity, no extra osseous disease -Xeloda continued 3. BRCA1 variant felt to be a polymorphism  4. Pain/tenderness at the left low anterolateral chest wall-she completed palliative radiation on 05/28/2012 with improvement in the pain. She was also treated with radiation to the thoracic spine.  5. Anemia/leukopenia-likely secondary to breast cancer involving the bone marrow and radiation /afinitor -the anemia is improved  6. History of mildly elevated liver enzymes-? Related to afinitor,? Secondary to metastatic breast cancer 7. Neutropenia secondary to Palbociclib. Improved. 8. Pain at the left shoulder and mid back-a plain x-ray of the left shoulder 06/12/2015 revealed a sclerotic density at the left scapula adjacent to the glenoid, there is metastatic disease involving the humeral head and left scapula on the PET scan 05/18/2015. She completed a course of radiation to the left shoulder on 08/05/2015. 9. Mild hypercalcemia-asymptomatic; Delton See 11/26/2015; persistent/progressive hypercalcemia 12/24/2015. She received Zometa 12/24/2015, she continues monthly Zometa.  10. Low neck/upper back pain 12/24/2015 with MRI showing a mildly displaced acute-appearing C7 spinous process fracture; widespread osseous metastatic disease; no evidence of epidural tumor or spinal cord compression. - Completed palliative radiation to the cervical spine 02/17/2016 11. Dysphagia/odynophagia secondary to  radiation-resolved 12. Xeloda-induced skin rash    Disposition:  Her overall status appears unchanged. The left hip discomfort may be related to metastatic breast cancer or a benign musculoskeletal condition. She will contact us for increased pain. She will receive Zometa. The mildly elevated calcium may be related to breast cancer or a benign finding.  She will be scheduled for a restaging PET scan prior to an office visit in September.  15 minutes were spent with the patient today. The majority of the time was used for counseling and coordination of care. Donneta Romberg, MD  04/13/2017  3:06 PM

## 2017-04-13 NOTE — Patient Instructions (Signed)

## 2017-04-18 ENCOUNTER — Telehealth: Payer: Self-pay | Admitting: *Deleted

## 2017-04-18 ENCOUNTER — Telehealth: Payer: Self-pay

## 2017-04-18 DIAGNOSIS — C7951 Secondary malignant neoplasm of bone: Secondary | ICD-10-CM

## 2017-04-18 DIAGNOSIS — Z171 Estrogen receptor negative status [ER-]: Principal | ICD-10-CM

## 2017-04-18 DIAGNOSIS — C50919 Malignant neoplasm of unspecified site of unspecified female breast: Secondary | ICD-10-CM

## 2017-04-18 MED ORDER — CAPECITABINE 500 MG PO TABS: ORAL_TABLET | ORAL | 0 refills | Status: DC

## 2017-04-18 NOTE — Telephone Encounter (Signed)
Call placed to pt to confirm that Xeloda prescription has been sent in to the pharmacy

## 2017-04-18 NOTE — Telephone Encounter (Signed)
Voicemail from patient and [facility] requesting refill for Capecitabine.  This nurse sent E-Rx.  Called [facility] confirming receipt of eRx .  Notified patient.  With this call reports her supplement takes effect 04-19-2017 so now needs [employer] supplement information faxed to [facility].  Preferred Pharmacy list  Verified as "[facility] used for Ibrance no longer needed.

## 2017-04-19 ENCOUNTER — Other Ambulatory Visit: Payer: Self-pay | Admitting: *Deleted

## 2017-05-01 ENCOUNTER — Other Ambulatory Visit: Payer: Self-pay | Admitting: *Deleted

## 2017-05-01 DIAGNOSIS — C7951 Secondary malignant neoplasm of bone: Secondary | ICD-10-CM

## 2017-05-01 DIAGNOSIS — C50919 Malignant neoplasm of unspecified site of unspecified female breast: Secondary | ICD-10-CM

## 2017-05-01 MED ORDER — HYDROCODONE-ACETAMINOPHEN 7.5-325 MG PO TABS: 1.0000 | ORAL_TABLET | ORAL | 0 refills | Status: DC | PRN

## 2017-05-16 ENCOUNTER — Other Ambulatory Visit: Payer: Self-pay | Admitting: Emergency Medicine

## 2017-05-16 DIAGNOSIS — C7951 Secondary malignant neoplasm of bone: Secondary | ICD-10-CM

## 2017-05-16 DIAGNOSIS — C50919 Malignant neoplasm of unspecified site of unspecified female breast: Secondary | ICD-10-CM

## 2017-05-16 DIAGNOSIS — Z171 Estrogen receptor negative status [ER-]: Secondary | ICD-10-CM

## 2017-05-16 MED ORDER — CAPECITABINE 500 MG PO TABS: ORAL_TABLET | ORAL | 0 refills | Status: DC

## 2017-05-17 ENCOUNTER — Other Ambulatory Visit: Payer: Self-pay | Admitting: Pharmacist

## 2017-05-17 ENCOUNTER — Other Ambulatory Visit: Payer: Self-pay

## 2017-05-17 DIAGNOSIS — Z171 Estrogen receptor negative status [ER-]: Secondary | ICD-10-CM

## 2017-05-17 DIAGNOSIS — C50919 Malignant neoplasm of unspecified site of unspecified female breast: Secondary | ICD-10-CM

## 2017-05-17 DIAGNOSIS — C7951 Secondary malignant neoplasm of bone: Secondary | ICD-10-CM

## 2017-05-17 MED ORDER — CAPECITABINE 500 MG PO TABS: ORAL_TABLET | ORAL | 0 refills | Status: DC

## 2017-05-23 ENCOUNTER — Other Ambulatory Visit: Payer: Self-pay

## 2017-05-23 DIAGNOSIS — C7951 Secondary malignant neoplasm of bone: Secondary | ICD-10-CM

## 2017-05-23 DIAGNOSIS — C50919 Malignant neoplasm of unspecified site of unspecified female breast: Secondary | ICD-10-CM

## 2017-05-23 MED ORDER — HYDROCODONE-ACETAMINOPHEN 7.5-325 MG PO TABS: 1.0000 | ORAL_TABLET | ORAL | 0 refills | Status: DC | PRN

## 2017-05-23 NOTE — Telephone Encounter (Signed)
Pt called and informed that she can pick up her pain medication prescription as requested tomorrow at [facility] during office hours. Patient also given the phone number to call central scheduling and schedule her PET scan at her convenience. Pt verbalizes understanding and agrees with plan of care.

## 2017-05-24 ENCOUNTER — Other Ambulatory Visit: Payer: Self-pay | Admitting: *Deleted

## 2017-05-24 DIAGNOSIS — C50919 Malignant neoplasm of unspecified site of unspecified female breast: Secondary | ICD-10-CM

## 2017-05-24 DIAGNOSIS — C7951 Secondary malignant neoplasm of bone: Principal | ICD-10-CM

## 2017-05-25 DIAGNOSIS — D72819 Decreased white blood cell count, unspecified: Secondary | ICD-10-CM | POA: Diagnosis not present

## 2017-05-25 DIAGNOSIS — R05 Cough: Secondary | ICD-10-CM | POA: Diagnosis not present

## 2017-05-25 DIAGNOSIS — E785 Hyperlipidemia, unspecified: Secondary | ICD-10-CM | POA: Diagnosis not present

## 2017-05-25 DIAGNOSIS — C50911 Malignant neoplasm of unspecified site of right female breast: Secondary | ICD-10-CM | POA: Diagnosis not present

## 2017-05-25 DIAGNOSIS — R799 Abnormal finding of blood chemistry, unspecified: Secondary | ICD-10-CM | POA: Diagnosis not present

## 2017-06-05 ENCOUNTER — Ambulatory Visit (HOSPITAL_COMMUNITY)
Admission: RE | Admit: 2017-06-05 | Discharge: 2017-06-05 | Disposition: A | Discharge status: Home / Self Care | Payer: Medicare Other | Source: Ambulatory Visit | Attending: Oncology | Admitting: Oncology

## 2017-06-05 DIAGNOSIS — C7951 Secondary malignant neoplasm of bone: Secondary | ICD-10-CM | POA: Diagnosis not present

## 2017-06-05 DIAGNOSIS — N281 Cyst of kidney, acquired: Secondary | ICD-10-CM | POA: Diagnosis not present

## 2017-06-05 DIAGNOSIS — C50919 Malignant neoplasm of unspecified site of unspecified female breast: Secondary | ICD-10-CM | POA: Insufficient documentation

## 2017-06-05 LAB — GLUCOSE, CAPILLARY: Glucose-Capillary: 103 mg/dL — ABNORMAL HIGH (ref 65–99)

## 2017-06-05 MED ORDER — FLUDEOXYGLUCOSE F - 18 (FDG) INJECTION
9.0000 | Freq: Once | INTRAVENOUS | Status: AC | PRN
  Administered 2017-06-05: 9 via INTRAVENOUS

## 2017-06-08 ENCOUNTER — Telehealth: Payer: Self-pay | Admitting: Oncology

## 2017-06-08 ENCOUNTER — Ambulatory Visit (HOSPITAL_BASED_OUTPATIENT_CLINIC_OR_DEPARTMENT_OTHER): Payer: Medicare Other

## 2017-06-08 ENCOUNTER — Ambulatory Visit (HOSPITAL_BASED_OUTPATIENT_CLINIC_OR_DEPARTMENT_OTHER): Payer: Medicare Other | Admitting: Oncology

## 2017-06-08 ENCOUNTER — Other Ambulatory Visit (HOSPITAL_BASED_OUTPATIENT_CLINIC_OR_DEPARTMENT_OTHER): Payer: Medicare Other

## 2017-06-08 VITALS — BP 127/69 | HR 80 | Temp 98.4°F | Resp 18 | Ht 65.0 in | Wt 174.4 lb

## 2017-06-08 DIAGNOSIS — C50919 Malignant neoplasm of unspecified site of unspecified female breast: Secondary | ICD-10-CM

## 2017-06-08 DIAGNOSIS — C7951 Secondary malignant neoplasm of bone: Secondary | ICD-10-CM

## 2017-06-08 DIAGNOSIS — Z853 Personal history of malignant neoplasm of breast: Secondary | ICD-10-CM

## 2017-06-08 DIAGNOSIS — Z9889 Other specified postprocedural states: Secondary | ICD-10-CM

## 2017-06-08 LAB — COMPREHENSIVE METABOLIC PANEL
ALT: 41 U/L (ref 0–55)
AST: 50 U/L — AB (ref 5–34)
Albumin: 4 g/dL (ref 3.5–5.0)
Alkaline Phosphatase: 138 U/L (ref 40–150)
Anion Gap: 10 mEq/L (ref 3–11)
BUN: 16.1 mg/dL (ref 7.0–26.0)
CALCIUM: 14.2 mg/dL — AB (ref 8.4–10.4)
CHLORIDE: 105 meq/L (ref 98–109)
CO2: 26 meq/L (ref 22–29)
CREATININE: 0.9 mg/dL (ref 0.6–1.1)
EGFR: 70 mL/min/{1.73_m2} — ABNORMAL LOW (ref >90)
Glucose: 97 mg/dl (ref 70–140)
POTASSIUM: 3.2 meq/L — AB (ref 3.5–5.1)
SODIUM: 142 meq/L (ref 136–145)
Total Bilirubin: 0.87 mg/dL (ref 0.20–1.20)
Total Protein: 7.4 g/dL (ref 6.4–8.3)

## 2017-06-08 LAB — CBC WITH DIFFERENTIAL/PLATELET
BASO%: 0.2 % (ref 0.0–2.0)
BASOS ABS: 0 10*3/uL (ref 0.0–0.1)
EOS ABS: 0.1 10*3/uL (ref 0.0–0.5)
EOS%: 1.9 % (ref 0.0–7.0)
HCT: 34.9 % (ref 34.8–46.6)
HGB: 12.1 g/dL (ref 11.6–15.9)
LYMPH%: 15.8 % (ref 14.0–49.7)
MCH: 35.7 pg — AB (ref 25.1–34.0)
MCHC: 34.7 g/dL (ref 31.5–36.0)
MCV: 102.9 fL — AB (ref 79.5–101.0)
MONO#: 0.4 10*3/uL (ref 0.1–0.9)
MONO%: 8 % (ref 0.0–14.0)
NEUT#: 3.5 10*3/uL (ref 1.5–6.5)
NEUT%: 74.1 % (ref 38.4–76.8)
Platelets: 164 10*3/uL (ref 145–400)
RBC: 3.39 10*6/uL — AB (ref 3.70–5.45)
RDW: 15.6 % — ABNORMAL HIGH (ref 11.2–14.5)
WBC: 4.8 10*3/uL (ref 3.9–10.3)
lymph#: 0.8 10*3/uL — ABNORMAL LOW (ref 0.9–3.3)

## 2017-06-08 MED ORDER — ZOLEDRONIC ACID 4 MG/100ML IV SOLN
4.0000 mg | Freq: Once | INTRAVENOUS | Status: AC
  Administered 2017-06-08: 4 mg via INTRAVENOUS
  Filled 2017-06-08: qty 100

## 2017-06-08 MED ORDER — SODIUM CHLORIDE 0.9 % IV SOLN
INTRAVENOUS | Status: AC
  Administered 2017-06-08: 10:00:00 via INTRAVENOUS

## 2017-06-08 MED ORDER — HYDROCODONE-ACETAMINOPHEN 7.5-325 MG PO TABS: 1.0000 | ORAL_TABLET | ORAL | 0 refills | Status: DC | PRN

## 2017-06-08 NOTE — Telephone Encounter (Signed)
Gave avs and calendar for october °

## 2017-06-08 NOTE — Progress Notes (Signed)
[facility] OFFICE PROGRESS NOTE   Diagnosis: Breast cancer  INTERVAL HISTORY:   Ms. Waltman returns as scheduled. She continues Xeloda. She continues to have pain in multiple sites including the back,, left pelvis, and right but not. She takes approximate 6 hydrocodone tablets per day for relief of pain. She is currently recovering from a "sinus "infection. No tooth or jaw pain.  Objective:  Vital signs in last 24 hours:  Blood pressure 127/69, pulse 80, temperature 98.4 F (36.9 C), temperature source Oral, resp. rate 18, height _0  (1.651 m), weight 174 lb 6.4 oz (79.1 kg), last menstrual period 10/08/2014, SpO2 99 %.   Resp: Lungs clear bilaterally Cardio: Regular rate and rhythm GI: No hepatosplenomegaly Vascular: No leg edema  Skin: Mild erythematous rash at the pretibial area bilaterally  Musculoskeletal: No tenderness at the right issue him, mild tenderness at the left lateral chest wall, no pain with motion at either hip  Lab Results:  Lab Results  Component Value Date   WBC 4.8 06/08/2017   HGB 12.1 06/08/2017   HCT 34.9 06/08/2017   MCV 102.9 (H) 06/08/2017   PLT 164 06/08/2017   NEUTROABS 3.5 06/08/2017    CMP     Component Value Date/Time   NA 142 06/08/2017 0807   K 3.2 (L) 06/08/2017 0807   CL 107 12/20/2012 0927   CO2 26 06/08/2017 0807   GLUCOSE 97 06/08/2017 0807   GLUCOSE 89 12/20/2012 0927   BUN 16.1 06/08/2017 0807   CREATININE 0.9 06/08/2017 0807   CALCIUM 14.2 (HH) 06/08/2017 0807   PROT 7.4 06/08/2017 0807   ALBUMIN 4.0 06/08/2017 0807   AST 50 (H) 06/08/2017 0807   ALT 41 06/08/2017 0807   ALKPHOS 138 06/08/2017 0807   BILITOT 0.87 06/08/2017 0807     Imaging:  Nm Pet Image Restag (ps) Skull Base To Thigh  Result Date: 06/05/2017 CLINICAL DATA:  Subsequent treatment strategy for breast cancer. EXAM: NUCLEAR MEDICINE PET SKULL BASE TO THIGH TECHNIQUE: 9.0 MCi F-18 FDG was injected intravenously. Full-ring PET  imaging was performed from the skull base to thigh after the radiotracer. CT data was obtained and used for attenuation correction and anatomic localization. FASTING BLOOD GLUCOSE:  Value: 103 mg/dl COMPARISON:  08/31/2016 FINDINGS: NECK: No hypermetabolic lymph nodes in the neck. Air-fluid levels are noted in the maxillary sinuses bilaterally. CHEST: No hypermetabolic mediastinal or hilar nodes. No suspicious pulmonary nodules on the CT scan. ABDOMEN/PELVIS: Liver is diff markedly heterogeneous, likely representing geographic fatty deposition. FDG accumulation in the liver is mottled, similar to prior. There are some areas of more focal nodular differential attenuation within the liver parenchyma although a direct mapping of hypermetabolism to these areas is not possible. No evidence for hypermetabolic lymphadenopathy in the abdomen. No abnormal uptake in the spleen, adrenal glands, pancreas or kidneys. 2.5 cm exophytic lesion lower pole left kidney is photopenic on PET imaging and likely represents a complex cyst. Right adnexal cyst is unchanged. SKELETON: Clear interval progression of diffuse bony metastatic disease with increased hypermetabolism in associated bony changes on CT imaging. Index lesion in the T3 vertebral body demonstrates SUV max = 16.3 and shows abnormal paraspinal soft tissue (see image 47 of CT series 4). Interval development of a destructive soft tissue lesion in the left T8 transverse process demonstrates confluent hypermetabolism with the adjacent vertebral body and SUV max = 29.9 hypermetabolic metastases are progressive in both shoulders, ribs, sternum, spine, and bony pelvis posterior left iliac  crest lesion demonstrates SUV max = 7.5. Despite the widespread involvement of the cervical, thoracic, lumbar spine, no definite gross epidural tumor can be identified by CT. IMPRESSION: 1. Clear interval progression of bony metastatic disease, as described. 2. Markedly heterogeneous liver. A  component of this is related to geographic fatty deposition. FDG accumulation in the liver parenchyma is markedly mottled and metastatic disease in the liver is not considered likely, but cannot be excluded. 3. Bilateral air-fluid levels in the maxillary sinuses suggest acute on chronic sinusitis. 4. Stable appearance complex cyst lower pole left kidney. 5. Stable right adnexal cyst. Electronically Signed   By: Misty Stanley M.D.   On: 06/05/2017 15:25    Medications: I have reviewed the patient's current medications.  Assessment/Plan: 1. Multicentric invasive lobular carcinoma of the right breast diagnosed in December of 1999, a right mastectomy followed by adjuvant [name] chemotherapy, 5 years of tamoxifen, and 5 years of Femara. The Femara was completed in June of 2010. The right breast mass excision in January 2000 was ER positive, PR positive, and HER-2 negative  2. Pain at the left groin area with a CT of the pelvis on 02/07/12 confirming a destructive lytic lesion at the left pubic symphysis, status post a CT-guided biopsy on 02/16/12 confirming metastatic carcinoma, focally ER positive .  -PET scan 03/07/2012 confirmed multiple hypermetabolic bone metastases with no other evidence of metastatic disease  -Status post palliative radiation to the pelvis beginning on 03/12/2012  -Initiation of Arimidex on 03/27/2012 -initiation of every three-month xgeva on 04/04/2012  -PET scan September 14 [id number] new hypermetabolic bone lesions  -Initiation of Faslodex 09/27/2012  -Restaging PET scan 04/08/2013 with evidence of disease progression in the bones  -Initiation of Aromasin/afinitor 05/29/2013  -Iliac biopsy at [facility] 05/22/2013 confirming HER-2 negative metastatic breast  -PET scan 11/18/2013 with a decrease in hypermetabolic activity  -PET scan 10/08/2014 with progressive diffuse hypermetabolic bone metastases -Initiation of Palbociclib 10/20/2014; Faslodex 10/23/2014. -Palbociclib  placed on hold beginning 11/07/2014 due to neutropenia. -Palbociclib resumed 11/27/2014 on an every other day schedule -Palbociclib resumed 01/04/2015 at a reduced dose of 100 mg daily for 21 days. -Palbociclib beginning 02/12/2015 at a dose of 100 mg daily for 14 days on/14 days off. -PET scan 05/18/2015 with diffuse hypermetabolic bone lesions, increased metabolic activity compared to the PET scan from January 2016 -Palbociclib resumed 06/07/2015, Faslodex continued 06/12/2015 -PET scan 09/22/2015 with diffuse hypermetabolic bone lesions, increased metabolic activity compared to the PET scan 05/18/2015 -Continuation of palbociclib and Faslodex -palbociclib and Faslodex discontinued April 2017 secondary to a C7 spinous process fracture and hypercalcemia -PET scan [id number] hypermetabolic bone metastases, with increased metabolic activity interpreted as consistent with disease progression - Xeloda (7 days on/7 days off) started 02/28/2016 -PET scan 08/31/2016-marked improvement in hypermetabolic bone activity, no extra osseous disease -Xeloda continued -PET scan 06/05/2017-progression of bone metastase including a T3 lesion with paraspinal soft tissue, destructive lesion at T8 and progressive lesions at the shoulders, ribs, sternum, and pelvis -Xeloda discontinued 06/08/2017 3. BRCA1 variant felt to be a polymorphism  4. Pain/tenderness at the left low anterolateral chest wall-she completed palliative radiation on 05/28/2012 with improvement in the pain. She was also treated with radiation to the thoracic spine.  5. Anemia/leukopenia-likely secondary to breast cancer involving the bone marrow and radiation /afinitor -the anemia is improved  6. History of mildly elevated liver enzymes-? Related to afinitor,? Secondary to metastatic breast cancer 7. Neutropenia secondary to Palbociclib. Improved. 8. Pain at the left shoulder  and mid back-a plain x-ray of the left shoulder 06/12/2015  revealed a sclerotic density at the left scapula adjacent to the glenoid, there is metastatic disease involving the humeral head and left scapula on the PET scan 05/18/2015. She completed a course of radiation to the left shoulder on 08/05/2015. 9. Hypercalcemia of malignancy-progressive 06/08/2017, Zometa 06/08/2017 10. Low neck/upper back pain 12/24/2015 with MRI showing a mildly displaced acute-appearing C7 spinous process fracture; widespread osseous metastatic disease; no evidence of epidural tumor or spinal cord compression. - Completed palliative radiation to the cervical spine 02/17/2016 11. Dysphagia/odynophagia secondary to radiation-resolved 12. Xeloda-induced skin rash    Disposition:  Ms. Speranza appears unchanged. The restaging PET scan reveals progressive diffuse bone metastases. She has progressive hypercalcemia. She will receive intravenous fluids and Zometa today. We will have her return for a chemistry panel early next week. She is asymptomatic from the hypercalcemia today. She will contact us for symptoms of hypercalcemia.  I discussed treatment options with Ms. Carberry and her husband. We reviewed the PET images. She has been treated with multiple hormonal regimens in the past. I will refer her back to Dr. Albertina Parr to consider clinical trial options at [facility]. Outside of a clinical trial I recommend weekly Taxol.  She will return for an office visit in 3 weeks. We will see her sooner as needed.  30 minutes were spent with the patient today. The majority of the time was used for counseling and coordination of care.    Donneta Romberg, MD  06/08/2017  9:09 AM

## 2017-06-08 NOTE — Patient Instructions (Signed)

## 2017-06-09 ENCOUNTER — Other Ambulatory Visit: Payer: Self-pay | Admitting: *Deleted

## 2017-06-09 NOTE — Telephone Encounter (Signed)
Returned fax to [facility] denying Xeloda refill due to progression.

## 2017-06-12 ENCOUNTER — Other Ambulatory Visit: Payer: Self-pay

## 2017-06-12 ENCOUNTER — Other Ambulatory Visit (HOSPITAL_BASED_OUTPATIENT_CLINIC_OR_DEPARTMENT_OTHER): Payer: Medicare Other

## 2017-06-12 ENCOUNTER — Telehealth: Payer: Self-pay | Admitting: Oncology

## 2017-06-12 DIAGNOSIS — C7951 Secondary malignant neoplasm of bone: Secondary | ICD-10-CM | POA: Diagnosis present

## 2017-06-12 DIAGNOSIS — C50919 Malignant neoplasm of unspecified site of unspecified female breast: Secondary | ICD-10-CM

## 2017-06-12 DIAGNOSIS — Z853 Personal history of malignant neoplasm of breast: Secondary | ICD-10-CM

## 2017-06-12 LAB — COMPREHENSIVE METABOLIC PANEL
ALBUMIN: 3.9 g/dL (ref 3.5–5.0)
ALK PHOS: 127 U/L (ref 40–150)
ALT: 35 U/L (ref 0–55)
ANION GAP: 9 meq/L (ref 3–11)
AST: 46 U/L — ABNORMAL HIGH (ref 5–34)
BILIRUBIN TOTAL: 0.81 mg/dL (ref 0.20–1.20)
BUN: 11.2 mg/dL (ref 7.0–26.0)
CALCIUM: 12.4 mg/dL — AB (ref 8.4–10.4)
CO2: 26 meq/L (ref 22–29)
CREATININE: 0.8 mg/dL (ref 0.6–1.1)
Chloride: 107 mEq/L (ref 98–109)
EGFR: 75 mL/min/{1.73_m2} — AB (ref >90)
Glucose: 111 mg/dl (ref 70–140)
Potassium: 3.1 mEq/L — ABNORMAL LOW (ref 3.5–5.1)
Sodium: 142 mEq/L (ref 136–145)
TOTAL PROTEIN: 7.4 g/dL (ref 6.4–8.3)

## 2017-06-12 MED ORDER — POTASSIUM CHLORIDE CRYS ER 20 MEQ PO TBCR: 20.0000 meq | EXTENDED_RELEASE_TABLET | Freq: Every day | ORAL | 0 refills | Status: DC

## 2017-06-12 NOTE — Telephone Encounter (Signed)
Scheduled appt per 9/24 sch msg. Spoke with patient regarding this appt.

## 2017-06-19 ENCOUNTER — Ambulatory Visit (HOSPITAL_BASED_OUTPATIENT_CLINIC_OR_DEPARTMENT_OTHER): Payer: Medicare Other

## 2017-06-19 ENCOUNTER — Telehealth: Payer: Self-pay | Admitting: *Deleted

## 2017-06-19 ENCOUNTER — Other Ambulatory Visit (HOSPITAL_BASED_OUTPATIENT_CLINIC_OR_DEPARTMENT_OTHER): Payer: Medicare Other

## 2017-06-19 DIAGNOSIS — Z853 Personal history of malignant neoplasm of breast: Secondary | ICD-10-CM | POA: Diagnosis not present

## 2017-06-19 DIAGNOSIS — C50919 Malignant neoplasm of unspecified site of unspecified female breast: Secondary | ICD-10-CM

## 2017-06-19 DIAGNOSIS — Z9889 Other specified postprocedural states: Secondary | ICD-10-CM

## 2017-06-19 DIAGNOSIS — C7951 Secondary malignant neoplasm of bone: Secondary | ICD-10-CM

## 2017-06-19 LAB — COMPREHENSIVE METABOLIC PANEL
ALBUMIN: 4.4 g/dL (ref 3.5–5.0)
ALK PHOS: 172 U/L — AB (ref 40–150)
ALT: 46 U/L (ref 0–55)
AST: 63 U/L — ABNORMAL HIGH (ref 5–34)
Anion Gap: 10 mEq/L (ref 3–11)
BUN: 12.9 mg/dL (ref 7.0–26.0)
CHLORIDE: 106 meq/L (ref 98–109)
CO2: 23 meq/L (ref 22–29)
Calcium: 14.6 mg/dL (ref 8.4–10.4)
Creatinine: 0.9 mg/dL (ref 0.6–1.1)
EGFR: 72 mL/min/{1.73_m2} — AB (ref >90)
GLUCOSE: 103 mg/dL (ref 70–140)
POTASSIUM: 3.9 meq/L (ref 3.5–5.1)
SODIUM: 139 meq/L (ref 136–145)
Total Bilirubin: 0.88 mg/dL (ref 0.20–1.20)
Total Protein: 8.1 g/dL (ref 6.4–8.3)

## 2017-06-19 MED ORDER — ZOLEDRONIC ACID 4 MG/100ML IV SOLN
4.0000 mg | Freq: Once | INTRAVENOUS | Status: AC
  Administered 2017-06-19: 4 mg via INTRAVENOUS
  Filled 2017-06-19: qty 100

## 2017-06-19 MED ORDER — SODIUM CHLORIDE 0.9 % IV SOLN
Freq: Once | INTRAVENOUS | Status: AC
  Administered 2017-06-19: 12:00:00 via INTRAVENOUS

## 2017-06-19 NOTE — Telephone Encounter (Signed)
Received call from lab re: critical calcium level 14.6. Result read back to confirm, printout received. Labs reviewed by Dr. Benay Spice: order received for Zometa today. Pt worked in and escorted to infusion room. She denied any confusion.  Pt is scheduled to see Dr. Albertina Parr 06/21/17

## 2017-06-19 NOTE — Telephone Encounter (Signed)
Called pt with lab appt for 10/8 @ 10AM, repeat CMET per Dr. Benay Spice. She voiced understanding of plan.

## 2017-06-19 NOTE — Patient Instructions (Signed)

## 2017-06-21 DIAGNOSIS — C7951 Secondary malignant neoplasm of bone: Secondary | ICD-10-CM | POA: Diagnosis not present

## 2017-06-23 ENCOUNTER — Encounter: Payer: Self-pay | Admitting: Nurse Practitioner

## 2017-06-26 ENCOUNTER — Ambulatory Visit (HOSPITAL_BASED_OUTPATIENT_CLINIC_OR_DEPARTMENT_OTHER): Payer: Medicare Other

## 2017-06-26 ENCOUNTER — Ambulatory Visit (HOSPITAL_BASED_OUTPATIENT_CLINIC_OR_DEPARTMENT_OTHER): Payer: Medicare Other | Admitting: Oncology

## 2017-06-26 VITALS — BP 116/76 | HR 81 | Temp 98.2°F | Resp 16 | Ht 65.0 in | Wt 170.2 lb

## 2017-06-26 DIAGNOSIS — Z5111 Encounter for antineoplastic chemotherapy: Secondary | ICD-10-CM

## 2017-06-26 DIAGNOSIS — C7951 Secondary malignant neoplasm of bone: Secondary | ICD-10-CM

## 2017-06-26 DIAGNOSIS — Z853 Personal history of malignant neoplasm of breast: Secondary | ICD-10-CM

## 2017-06-26 DIAGNOSIS — Z9889 Other specified postprocedural states: Secondary | ICD-10-CM

## 2017-06-26 DIAGNOSIS — C50919 Malignant neoplasm of unspecified site of unspecified female breast: Secondary | ICD-10-CM

## 2017-06-26 DIAGNOSIS — Z7189 Other specified counseling: Secondary | ICD-10-CM

## 2017-06-26 LAB — COMPREHENSIVE METABOLIC PANEL
ALT: 41 U/L (ref 0–55)
ANION GAP: 8 meq/L (ref 3–11)
AST: 78 U/L — ABNORMAL HIGH (ref 5–34)
Albumin: 4.1 g/dL (ref 3.5–5.0)
Alkaline Phosphatase: 177 U/L — ABNORMAL HIGH (ref 40–150)
BILIRUBIN TOTAL: 0.74 mg/dL (ref 0.20–1.20)
BUN: 14 mg/dL (ref 7.0–26.0)
CHLORIDE: 107 meq/L (ref 98–109)
CO2: 24 meq/L (ref 22–29)
Calcium: 14.4 mg/dL (ref 8.4–10.4)
Creatinine: 0.8 mg/dL (ref 0.6–1.1)
EGFR: 76 mL/min/{1.73_m2} — AB (ref >90)
Glucose: 102 mg/dl (ref 70–140)
POTASSIUM: 3.9 meq/L (ref 3.5–5.1)
Sodium: 139 mEq/L (ref 136–145)
Total Protein: 7.8 g/dL (ref 6.4–8.3)

## 2017-06-26 LAB — CBC WITH DIFFERENTIAL/PLATELET
BASO%: 0.4 % (ref 0.0–2.0)
BASOS ABS: 0 10*3/uL (ref 0.0–0.1)
EOS%: 0.5 % (ref 0.0–7.0)
Eosinophils Absolute: 0 10*3/uL (ref 0.0–0.5)
HCT: 33.7 % — ABNORMAL LOW (ref 34.8–46.6)
HGB: 11.6 g/dL (ref 11.6–15.9)
LYMPH%: 17.4 % (ref 14.0–49.7)
MCH: 35 pg — AB (ref 25.1–34.0)
MCHC: 34.4 g/dL (ref 31.5–36.0)
MCV: 101.8 fL — ABNORMAL HIGH (ref 79.5–101.0)
MONO#: 0.5 10*3/uL (ref 0.1–0.9)
MONO%: 8.7 % (ref 0.0–14.0)
NEUT#: 4 10*3/uL (ref 1.5–6.5)
NEUT%: 73 % (ref 38.4–76.8)
PLATELETS: 196 10*3/uL (ref 145–400)
RBC: 3.31 10*6/uL — AB (ref 3.70–5.45)
RDW: 15.5 % — ABNORMAL HIGH (ref 11.2–14.5)
WBC: 5.5 10*3/uL (ref 3.9–10.3)
lymph#: 1 10*3/uL (ref 0.9–3.3)

## 2017-06-26 MED ORDER — SODIUM CHLORIDE 0.9 % IV SOLN
Freq: Once | INTRAVENOUS | Status: AC
  Administered 2017-06-26: 14:00:00 via INTRAVENOUS

## 2017-06-26 MED ORDER — SODIUM CHLORIDE 0.9 % IV SOLN: 10.0000 mg | Freq: Once | INTRAVENOUS | Status: DC

## 2017-06-26 MED ORDER — PAMIDRONATE DISODIUM 90 MG/10ML IV SOLN
90.0000 mg | Freq: Once | INTRAVENOUS | Status: AC
  Administered 2017-06-26: 90 mg via INTRAVENOUS
  Filled 2017-06-26: qty 10

## 2017-06-26 MED ORDER — OXYCODONE-ACETAMINOPHEN 5-325 MG PO TABS: 1.0000 | ORAL_TABLET | Freq: Four times a day (QID) | ORAL | 0 refills | Status: DC | PRN

## 2017-06-26 MED ORDER — DIPHENHYDRAMINE HCL 50 MG/ML IJ SOLN
INTRAMUSCULAR | Status: AC
  Filled 2017-06-26: qty 1

## 2017-06-26 MED ORDER — DIPHENHYDRAMINE HCL 50 MG/ML IJ SOLN
50.0000 mg | Freq: Once | INTRAMUSCULAR | Status: AC
  Administered 2017-06-26: 50 mg via INTRAVENOUS

## 2017-06-26 MED ORDER — DEXAMETHASONE SODIUM PHOSPHATE 10 MG/ML IJ SOLN
10.0000 mg | Freq: Once | INTRAMUSCULAR | Status: AC
  Administered 2017-06-26: 10 mg via INTRAVENOUS

## 2017-06-26 MED ORDER — FAMOTIDINE IN NACL 20-0.9 MG/50ML-% IV SOLN
INTRAVENOUS | Status: AC
  Filled 2017-06-26: qty 50

## 2017-06-26 MED ORDER — DEXAMETHASONE SODIUM PHOSPHATE 10 MG/ML IJ SOLN
INTRAMUSCULAR | Status: AC
  Filled 2017-06-26: qty 1

## 2017-06-26 MED ORDER — SODIUM CHLORIDE 0.9 % IV SOLN: Freq: Once | INTRAVENOUS | Status: AC

## 2017-06-26 MED ORDER — SODIUM CHLORIDE 0.9 % IV SOLN
80.0000 mg/m2 | Freq: Once | INTRAVENOUS | Status: AC
  Administered 2017-06-26: 150 mg via INTRAVENOUS
  Filled 2017-06-26: qty 25

## 2017-06-26 MED ORDER — FAMOTIDINE IN NACL 20-0.9 MG/50ML-% IV SOLN
20.0000 mg | Freq: Once | INTRAVENOUS | Status: AC
  Administered 2017-06-26: 20 mg via INTRAVENOUS

## 2017-06-26 NOTE — Patient Instructions (Signed)
[facility] Discharge Instructions for Patients Receiving Chemotherapy  Today you received the following chemotherapy agents Taxol.   To help prevent nausea and vomiting after your treatment, we encourage you to take your nausea medication as prescribed.   If you develop nausea and vomiting that is not controlled by your nausea medication, call the clinic.   BELOW ARE SYMPTOMS THAT SHOULD BE REPORTED IMMEDIATELY:  *FEVER GREATER THAN 100.5 F  *CHILLS WITH OR WITHOUT FEVER  NAUSEA AND VOMITING THAT IS NOT CONTROLLED WITH YOUR NAUSEA MEDICATION  *UNUSUAL SHORTNESS OF BREATH  *UNUSUAL BRUISING OR BLEEDING  TENDERNESS IN MOUTH AND THROAT WITH OR WITHOUT PRESENCE OF ULCERS  *URINARY PROBLEMS  *BOWEL PROBLEMS  UNUSUAL RASH Items with * indicate a potential emergency and should be followed up as soon as possible.  Feel free to call the clinic should you have any questions or concerns. The clinic phone number is [phone].  Please show the [facility] at check-in to the Emergency Department and triage nurse.    Paclitaxel injection What is this medicine? PACLITAXEL (PAK li TAX el) is a chemotherapy drug. It targets fast dividing cells, like cancer cells, and causes these cells to die. This medicine is used to treat ovarian cancer, breast cancer, and other cancers. This medicine may be used for other purposes; ask your health care provider or pharmacist if you have questions. COMMON BRAND NAME(S): Onxol, Taxol What should I tell my health care provider before I take this medicine? They need to know if you have any of these conditions: -blood disorders -irregular heartbeat -infection (especially a virus infection such as chickenpox, cold sores, or herpes) -liver disease -previous or ongoing radiation therapy -an unusual or allergic reaction to paclitaxel, alcohol, polyoxyethylated castor oil, other chemotherapy agents, other medicines, foods, dyes, or  preservatives -pregnant or trying to get pregnant -breast-feeding How should I use this medicine? This drug is given as an infusion into a vein. It is administered in a hospital or clinic by a specially trained health care professional. Talk to your pediatrician regarding the use of this medicine in children. Special care may be needed. Overdosage: If you think you have taken too much of this medicine contact a poison control center or emergency room at once. NOTE: This medicine is only for you. Do not share this medicine with others. What if I miss a dose? It is important not to miss your dose. Call your doctor or health care professional if you are unable to keep an appointment. What may interact with this medicine? Do not take this medicine with any of the following medications: -disulfiram -metronidazole This medicine may also interact with the following medications: -cyclosporine -diazepam -ketoconazole -medicines to increase blood counts like filgrastim, pegfilgrastim, sargramostim -other chemotherapy drugs like cisplatin, doxorubicin, epirubicin, etoposide, teniposide, vincristine -quinidine -testosterone -vaccines -verapamil Talk to your doctor or health care professional before taking any of these medicines: -acetaminophen -aspirin -ibuprofen -ketoprofen -naproxen This list may not describe all possible interactions. Give your health care provider a list of all the medicines, herbs, non-prescription drugs, or dietary supplements you use. Also tell them if you smoke, drink alcohol, or use illegal drugs. Some items may interact with your medicine. What should I watch for while using this medicine? Your condition will be monitored carefully while you are receiving this medicine. You will need important blood work done while you are taking this medicine. This medicine can cause serious allergic reactions. To reduce your risk you will  need to take other medicine(s) before  treatment with this medicine. If you experience allergic reactions like skin rash, itching or hives, swelling of the face, lips, or tongue, tell your doctor or health care professional right away. In some cases, you may be given additional medicines to help with side effects. Follow all directions for their use. This drug may make you feel generally unwell. This is not uncommon, as chemotherapy can affect healthy cells as well as cancer cells. Report any side effects. Continue your course of treatment even though you feel ill unless your doctor tells you to stop. Call your doctor or health care professional for advice if you get a fever, chills or sore throat, or other symptoms of a cold or flu. Do not treat yourself. This drug decreases your body's ability to fight infections. Try to avoid being around people who are sick. This medicine may increase your risk to bruise or bleed. Call your doctor or health care professional if you notice any unusual bleeding. Be careful brushing and flossing your teeth or using a toothpick because you may get an infection or bleed more easily. If you have any dental work done, tell your dentist you are receiving this medicine. Avoid taking products that contain aspirin, acetaminophen, ibuprofen, naproxen, or ketoprofen unless instructed by your doctor. These medicines may hide a fever. Do not become pregnant while taking this medicine. Women should inform their doctor if they wish to become pregnant or think they might be pregnant. There is a potential for serious side effects to an unborn child. Talk to your health care professional or pharmacist for more information. Do not breast-feed an infant while taking this medicine. Men are advised not to father a child while receiving this medicine. This product may contain alcohol. Ask your pharmacist or healthcare provider if this medicine contains alcohol. Be sure to tell all healthcare providers you are taking this medicine.  Certain medicines, like metronidazole and disulfiram, can cause an unpleasant reaction when taken with alcohol. The reaction includes flushing, headache, nausea, vomiting, sweating, and increased thirst. The reaction can last from 30 minutes to several hours. What side effects may I notice from receiving this medicine? Side effects that you should report to your doctor or health care professional as soon as possible: -allergic reactions like skin rash, itching or hives, swelling of the face, lips, or tongue -low blood counts - This drug may decrease the number of white blood cells, red blood cells and platelets. You may be at increased risk for infections and bleeding. -signs of infection - fever or chills, cough, sore throat, pain or difficulty passing urine -signs of decreased platelets or bleeding - bruising, pinpoint red spots on the skin, black, tarry stools, nosebleeds -signs of decreased red blood cells - unusually weak or tired, fainting spells, lightheadedness -breathing problems -chest pain -high or low blood pressure -mouth sores -nausea and vomiting -pain, swelling, redness or irritation at the injection site -pain, tingling, numbness in the hands or feet -slow or irregular heartbeat -swelling of the ankle, feet, hands Side effects that usually do not require medical attention (report to your doctor or health care professional if they continue or are bothersome): -bone pain -complete hair loss including hair on your head, underarms, pubic hair, eyebrows, and eyelashes -changes in the color of fingernails -diarrhea -loosening of the fingernails -loss of appetite -muscle or joint pain -red flush to skin -sweating This list may not describe all possible side effects. Call your doctor for medical  advice about side effects. You may report side effects to FDA at 1-800-FDA-1088. Where should I keep my medicine? This drug is given in a hospital or clinic and will not be stored at  home. NOTE: This sheet is a summary. It may not cover all possible information. If you have questions about this medicine, talk to your doctor, pharmacist, or health care provider.  2018 Elsevier/Gold Standard (2015-07-07 19:58:00)    Pamidronate injection What is this medicine? PAMIDRONATE (pa mi DROE nate) slows calcium loss from bones. It is used to treat high calcium blood levels from cancer or Paget's disease. It is also used to treat bone pain and prevent fractures from certain cancers that have spread to the bone. This medicine may be used for other purposes; ask your health care provider or pharmacist if you have questions. COMMON BRAND NAME(S): Aredia What should I tell my health care provider before I take this medicine? They need to know if you have any of these conditions: -aspirin-sensitive asthma -dental disease -kidney disease -an unusual or allergic reaction to pamidronate, other medicines, foods, dyes, or preservatives -pregnant or trying to get pregnant -breast-feeding How should I use this medicine? This medicine is for infusion into a vein. It is given by a health care professional in a hospital or clinic setting. Talk to your pediatrician regarding the use of this medicine in children. This medicine is not approved for use in children. Overdosage: If you think you have taken too much of this medicine contact a poison control center or emergency room at once. NOTE: This medicine is only for you. Do not share this medicine with others. What if I miss a dose? This does not apply. What may interact with this medicine? -certain antibiotics given by injection -medicines for inflammation or pain like ibuprofen, naproxen -some diuretics like bumetanide, furosemide -cyclosporine -parathyroid hormone -tacrolimus -teriparatide -thalidomide This list may not describe all possible interactions. Give your health care provider a list of all the medicines, herbs,  non-prescription drugs, or dietary supplements you use. Also tell them if you smoke, drink alcohol, or use illegal drugs. Some items may interact with your medicine. What should I watch for while using this medicine? Visit your doctor or health care professional for regular checkups. It may be some time before you see the benefit from this medicine. Do not stop taking your medicine unless your doctor tells you to. Your doctor may order blood tests or other tests to see how you are doing. Women should inform their doctor if they wish to become pregnant or think they might be pregnant. There is a potential for serious side effects to an unborn child. Talk to your health care professional or pharmacist for more information. You should make sure that you get enough calcium and vitamin D while you are taking this medicine. Discuss the foods you eat and the vitamins you take with your health care professional. Some people who take this medicine have severe bone, joint, and/or muscle pain. This medicine may also increase your risk for a broken thigh bone. Tell your doctor right away if you have pain in your upper leg or groin. Tell your doctor if you have any pain that does not go away or that gets worse. What side effects may I notice from receiving this medicine? Side effects that you should report to your doctor or health care professional as soon as possible: -allergic reactions like skin rash, itching or hives, swelling of the face, lips, or tongue -  black or tarry stools -changes in vision -eye inflammation, pain -high blood pressure -jaw pain, especially burning or cramping -muscle weakness -numb, tingling pain -swelling of feet or hands -trouble passing urine or change in the amount of urine -unable to move easily Side effects that usually do not require medical attention (report to your doctor or health care professional if they continue or are bothersome): -bone, joint, or muscle  pain -constipation -dizzy, drowsy -fever -headache -loss of appetite -nausea, vomiting -pain at site where injected This list may not describe all possible side effects. Call your doctor for medical advice about side effects. You may report side effects to FDA at 1-800-FDA-1088. Where should I keep my medicine? This drug is given in a hospital or clinic and will not be stored at home. NOTE: This sheet is a summary. It may not cover all possible information. If you have questions about this medicine, talk to your doctor, pharmacist, or health care provider.  2018 Elsevier/Gold Standard (2011-03-04 08:49:49)

## 2017-06-26 NOTE — Progress Notes (Addendum)
[facility] OFFICE PROGRESS NOTE   Diagnosis:  Breast cancer  INTERVAL HISTORY:   Stacy Horn is seen in an unscheduled visit due to persistent hypercalcemia. She received Zometa on 06/19/2017. Follow-up calcium today returned at 14.4. She notes she is sleeping more. No confusion. She notes increased constipation. Appetite is diminished. She notes thirst and frequent urination. She has continued low back pain. At times she also has bilateral shoulder pain. She takes hydrocodone as needed. She notes Excedrin helps the pain as well.  Objective:  Vital signs in last 24 hours:  Blood pressure 116/76, pulse 81, temperature 98.2 F (36.8 C), temperature source Oral, resp. rate 16, height '5\' 5"'  (1.651 m), weight 170 lb 3.2 oz (77.2 kg), last menstrual period 10/08/2014, SpO2 100 %.    HEENT: No thrush or ulcers. Lymphatics: Approximate 1 cm left axillary lymph node. Resp: Lungs clear bilaterally. Cardio: Regular rate and rhythm. GI: No hepatomegaly. Vascular: No leg edema. Neuro: Alert and oriented.     Lab Results:  Lab Results  Component Value Date   WBC 4.8 06/08/2017   HGB 12.1 06/08/2017   HCT 34.9 06/08/2017   MCV 102.9 (H) 06/08/2017   PLT 164 06/08/2017   NEUTROABS 3.5 06/08/2017    Imaging:  No results found. Medications: I have reviewed the patient's current medications.  Assessment/Plan: 1. Multicentric invasive lobular carcinoma of the right breast diagnosed in December of 1999, a right mastectomy followed by adjuvant [facility] chemotherapy, 5 years of tamoxifen, and 5 years of Femara. The Femara was completed in June of 2010. The right breast mass excision in January 2000 was ER positive, PR positive, and HER-2 negative  2. Pain at the left groin area with a CT of the pelvis on 02/07/12 confirming a destructive lytic lesion at the left pubic symphysis, status post a CT-guided biopsy on 02/16/12 confirming metastatic carcinoma, focally ER positive .  -PET  scan 03/07/2012 confirmed multiple hypermetabolic bone metastases with no other evidence of metastatic disease  -Status post palliative radiation to the pelvis beginning on 03/12/2012  -Initiation of Arimidex on 03/27/2012 -initiation of every three-month xgeva on 04/04/2012  -PET scan September 14 [id number] new hypermetabolic bone lesions  -Initiation of Faslodex 09/27/2012  -Restaging PET scan 04/08/2013 with evidence of disease progression in the bones  -Initiation of Aromasin/afinitor 05/29/2013  -Iliac biopsy at [facility] 05/22/2013 confirming HER-2 negative metastatic breast  -PET scan 11/18/2013 with a decrease in hypermetabolic activity  -PET scan 10/08/2014 with progressive diffuse hypermetabolic bone metastases -Initiation of Palbociclib 10/20/2014; Faslodex 10/23/2014. -Palbociclib placed on hold beginning 11/07/2014 due to neutropenia. -Palbociclib resumed 11/27/2014 on an every other day schedule -Palbociclib resumed 01/04/2015 at a reduced dose of 100 mg daily for 21 days. -Palbociclib beginning 02/12/2015 at a dose of 100 mg daily for 14 days on/14 days off. -PET scan 05/18/2015 with diffuse hypermetabolic bone lesions, increased metabolic activity compared to the PET scan from January 2016 -Palbociclib resumed 06/07/2015, Faslodex continued 06/12/2015 -PET scan 09/22/2015 with diffuse hypermetabolic bone lesions, increased metabolic activity compared to the PET scan 05/18/2015 -Continuation of palbociclib and Faslodex -palbociclib and Faslodex discontinued April 2017 secondary to a C7 spinous process fracture and hypercalcemia -PET scan [id number] hypermetabolic bone metastases, with increased metabolic activity interpreted as consistent with disease progression - Xeloda (7 days on/7 days off) started 02/28/2016 -PET scan 08/31/2016-marked improvement in hypermetabolic bone activity, no extra osseous disease -Xeloda continued -PET scan 06/05/2017-progression of  bone metastase including a T3 lesion with paraspinal  soft tissue, destructive lesion at T8 and progressive lesions at the shoulders, ribs, sternum, and pelvis -Xeloda discontinued 06/08/2017 -Initiation of weekly Taxol 3. BRCA1 variant felt to be a polymorphism  4. Pain/tenderness at the left low anterolateral chest wall-she completed palliative radiation on 05/28/2012 with improvement in the pain. She was also treated with radiation to the thoracic spine.  5. Anemia/leukopenia-likely secondary to breast cancer involving the bone marrow and radiation /afinitor -the anemia is improved  6. History of mildly elevated liver enzymes-? Related to afinitor,? Secondary to metastatic breast cancer 7. Neutropenia secondary to Palbociclib. Improved. 8. Pain at the left shoulder and mid back-a plain x-ray of the left shoulder 06/12/2015 revealed a sclerotic density at the left scapula adjacent to the glenoid, there is metastatic disease involving the humeral head and left scapula on the PET scan 05/18/2015. She completed a course of radiation to the left shoulder on 08/05/2015. 9. Hypercalcemia of malignancy-progressive 06/08/2017, Zometa 06/08/2017 10. Low neck/upper back pain 12/24/2015 with MRI showing a mildly displaced acute-appearing C7 spinous process fracture; widespread osseous metastatic disease; no evidence of epidural tumor or spinal cord compression. - Completed palliative radiation to the cervical spine 02/17/2016 11. Dysphagia/odynophagia secondary to radiation-resolved 12. Xeloda-induced skin rash 13. Hypercalcemia secondary to #1. Status post Zometa 06/08/2017 and 06/19/2017. Persistent hypercalcemia 06/26/2017. Pamidronate 06/26/2017.  Disposition: Stacy Horn has progressive metastatic breast cancer to bone and persistent hypercalcemia. Dr. Benay Spice recommends initiating treatment of the cancer with Taxol weekly 3 followed by a one-week break. We reviewed potential toxicities associated  with Taxol including a severe allergic reaction, myelosuppression, nausea, mouth sores, constipation, diarrhea, neuropathy, arthralgias, hair loss. She would like to proceed with treatment today. She has not had the oral dexamethasone premedication. We discussed the rationale for this. She understands the risk of an allergic reaction which could possibly be severe and is willing to accept this risk. She will receive intravenous dexamethasone as a part of the premedication regimen today.  For the hypercalcemia she will receive pamidronate 90 mg IV. She will also receive a liter of normal saline. She will return for a follow-up chemistry panel on 06/30/2017.  For pain she will try ibuprofen in place of Excedrin. She was also given a prescription for Percocet 1-2 tablets every 6 hours as needed.  She will return for a follow-up visit and week 2 Taxol on 07/03/2017. She will contact the office in the interim with any problems.  Patient seen with Dr. Benay Spice. 30 minutes were spent face-to-face at today's visit with the majority of that time involved in counseling/coordination of care.    Ned Card ANP/GNP-BC   06/26/2017  1:10 PM  This was a shared visit with Ned Card. Stacy Horn has progressive metastatic breast cancer. She is symptomatic with pain and has persistent hypercalcemia despite Zometa. We discussed systemic treatment options. I recommend beginning weekly Taxol. We reviewed the potential toxicities associated with Taxol and she agrees to proceed. She will receive intravenous fluids and pamidronate today. She will return for a calcium level on 06/30/2017.  Julieanne Manson, M.D.

## 2017-06-26 NOTE — Progress Notes (Signed)
Ok to treat with AST per MD Benay Spice.

## 2017-06-26 NOTE — Progress Notes (Signed)
START ON PATHWAY REGIMEN - Breast     A cycle is every 28 days (3 weeks on and 1 week off):     Paclitaxel   **Always confirm dose/schedule in your pharmacy ordering system**    Patient Characteristics: Metastatic Chemotherapy, HER2 Negative/Unknown/Equivocal, ER Positive, Second Line, No Prior Paclitaxel Therapeutic Status: Distant Metastases BRCA Mutation Status: Absent ER Status: Positive (+) HER2 Status: Negative (-) Would you be surprised if this patient died  in the next year<= I would be surprised if this patient died in the next year PR Status: Positive (+) Line of therapy: Second Line Intent of Therapy: Non-Curative / Palliative Intent, Discussed with Patient

## 2017-06-29 ENCOUNTER — Telehealth: Payer: Self-pay | Admitting: Oncology

## 2017-06-29 NOTE — Telephone Encounter (Signed)
Scheduled appt per 10/9 sch message - patient is aware of appt date and time.  

## 2017-06-29 NOTE — Telephone Encounter (Signed)
Spoke with patient regarding appts scheduled per 10/9 los

## 2017-06-30 ENCOUNTER — Other Ambulatory Visit (HOSPITAL_BASED_OUTPATIENT_CLINIC_OR_DEPARTMENT_OTHER): Payer: Medicare Other

## 2017-06-30 DIAGNOSIS — C50919 Malignant neoplasm of unspecified site of unspecified female breast: Secondary | ICD-10-CM

## 2017-06-30 DIAGNOSIS — Z853 Personal history of malignant neoplasm of breast: Secondary | ICD-10-CM

## 2017-06-30 DIAGNOSIS — C7951 Secondary malignant neoplasm of bone: Secondary | ICD-10-CM

## 2017-06-30 LAB — COMPREHENSIVE METABOLIC PANEL
ALT: 49 U/L (ref 0–55)
ANION GAP: 8 meq/L (ref 3–11)
AST: 69 U/L — ABNORMAL HIGH (ref 5–34)
Albumin: 4.1 g/dL (ref 3.5–5.0)
Alkaline Phosphatase: 154 U/L — ABNORMAL HIGH (ref 40–150)
BILIRUBIN TOTAL: 1.02 mg/dL (ref 0.20–1.20)
BUN: 12.1 mg/dL (ref 7.0–26.0)
CALCIUM: 13.2 mg/dL — AB (ref 8.4–10.4)
CO2: 23 mEq/L (ref 22–29)
CREATININE: 0.8 mg/dL (ref 0.6–1.1)
Chloride: 106 mEq/L (ref 98–109)
EGFR: >60 mL/min/{1.73_m2} (ref >60)
Glucose: 96 mg/dl (ref 70–140)
Potassium: 3.7 mEq/L (ref 3.5–5.1)
Sodium: 138 mEq/L (ref 136–145)
TOTAL PROTEIN: 7.8 g/dL (ref 6.4–8.3)

## 2017-06-30 LAB — CBC WITH DIFFERENTIAL/PLATELET
BASO%: 0.3 % (ref 0.0–2.0)
BASOS ABS: 0 10*3/uL (ref 0.0–0.1)
EOS%: 0.6 % (ref 0.0–7.0)
Eosinophils Absolute: 0 10*3/uL (ref 0.0–0.5)
HEMATOCRIT: 33.1 % — AB (ref 34.8–46.6)
HGB: 11.4 g/dL — ABNORMAL LOW (ref 11.6–15.9)
LYMPH%: 14.5 % (ref 14.0–49.7)
MCH: 35.4 pg — AB (ref 25.1–34.0)
MCHC: 34.5 g/dL (ref 31.5–36.0)
MCV: 102.5 fL — ABNORMAL HIGH (ref 79.5–101.0)
MONO#: 0.2 10*3/uL (ref 0.1–0.9)
MONO%: 4.1 % (ref 0.0–14.0)
NEUT#: 3.5 10*3/uL (ref 1.5–6.5)
NEUT%: 80.5 % — AB (ref 38.4–76.8)
Platelets: 208 10*3/uL (ref 145–400)
RBC: 3.23 10*6/uL — AB (ref 3.70–5.45)
RDW: 16.5 % — AB (ref 11.2–14.5)
WBC: 4.3 10*3/uL (ref 3.9–10.3)
lymph#: 0.6 10*3/uL — ABNORMAL LOW (ref 0.9–3.3)

## 2017-07-03 ENCOUNTER — Ambulatory Visit (HOSPITAL_BASED_OUTPATIENT_CLINIC_OR_DEPARTMENT_OTHER): Payer: Medicare Other | Admitting: Oncology

## 2017-07-03 ENCOUNTER — Other Ambulatory Visit: Payer: Self-pay | Admitting: *Deleted

## 2017-07-03 ENCOUNTER — Ambulatory Visit (HOSPITAL_BASED_OUTPATIENT_CLINIC_OR_DEPARTMENT_OTHER): Payer: Medicare Other

## 2017-07-03 ENCOUNTER — Telehealth: Payer: Self-pay | Admitting: Oncology

## 2017-07-03 ENCOUNTER — Telehealth: Payer: Self-pay | Admitting: Nurse Practitioner

## 2017-07-03 VITALS — BP 119/74 | HR 87 | Temp 98.5°F | Resp 18 | Ht 65.0 in | Wt 169.5 lb

## 2017-07-03 DIAGNOSIS — R110 Nausea: Secondary | ICD-10-CM | POA: Diagnosis not present

## 2017-07-03 DIAGNOSIS — C7951 Secondary malignant neoplasm of bone: Secondary | ICD-10-CM

## 2017-07-03 DIAGNOSIS — C50919 Malignant neoplasm of unspecified site of unspecified female breast: Secondary | ICD-10-CM

## 2017-07-03 DIAGNOSIS — Z853 Personal history of malignant neoplasm of breast: Secondary | ICD-10-CM

## 2017-07-03 DIAGNOSIS — Z5111 Encounter for antineoplastic chemotherapy: Secondary | ICD-10-CM

## 2017-07-03 LAB — COMPREHENSIVE METABOLIC PANEL
ALBUMIN: 4.1 g/dL (ref 3.5–5.0)
ALK PHOS: 159 U/L — AB (ref 40–150)
ALT: 44 U/L (ref 0–55)
ANION GAP: 10 meq/L (ref 3–11)
AST: 83 U/L — ABNORMAL HIGH (ref 5–34)
BUN: 14.9 mg/dL (ref 7.0–26.0)
CO2: 23 meq/L (ref 22–29)
Calcium: 15.3 mg/dL (ref 8.4–10.4)
Chloride: 105 mEq/L (ref 98–109)
Creatinine: 0.9 mg/dL (ref 0.6–1.1)
EGFR: >60 mL/min/{1.73_m2} (ref >60)
GLUCOSE: 103 mg/dL (ref 70–140)
POTASSIUM: 3.8 meq/L (ref 3.5–5.1)
SODIUM: 137 meq/L (ref 136–145)
Total Bilirubin: 0.71 mg/dL (ref 0.20–1.20)
Total Protein: 8 g/dL (ref 6.4–8.3)

## 2017-07-03 MED ORDER — DEXAMETHASONE SODIUM PHOSPHATE 10 MG/ML IJ SOLN
INTRAMUSCULAR | Status: AC
  Filled 2017-07-03: qty 1

## 2017-07-03 MED ORDER — PROCHLORPERAZINE MALEATE 5 MG PO TABS: 5.0000 mg | ORAL_TABLET | Freq: Four times a day (QID) | ORAL | 2 refills | Status: AC | PRN

## 2017-07-03 MED ORDER — DIPHENHYDRAMINE HCL 50 MG/ML IJ SOLN
25.0000 mg | Freq: Once | INTRAMUSCULAR | Status: AC
  Administered 2017-07-03: 25 mg via INTRAVENOUS

## 2017-07-03 MED ORDER — DIPHENHYDRAMINE HCL 50 MG/ML IJ SOLN
INTRAMUSCULAR | Status: AC
  Filled 2017-07-03: qty 1

## 2017-07-03 MED ORDER — ONDANSETRON HCL 8 MG PO TABS
8.0000 mg | ORAL_TABLET | Freq: Once | ORAL | Status: AC
  Administered 2017-07-03: 8 mg via ORAL

## 2017-07-03 MED ORDER — SODIUM CHLORIDE 0.9 % IV SOLN
Freq: Once | INTRAVENOUS | Status: AC
  Administered 2017-07-03: 10:00:00 via INTRAVENOUS

## 2017-07-03 MED ORDER — FAMOTIDINE IN NACL 20-0.9 MG/50ML-% IV SOLN
INTRAVENOUS | Status: AC
  Filled 2017-07-03: qty 50

## 2017-07-03 MED ORDER — ONDANSETRON HCL 8 MG PO TABS
ORAL_TABLET | ORAL | Status: AC
  Filled 2017-07-03: qty 1

## 2017-07-03 MED ORDER — DEXAMETHASONE SODIUM PHOSPHATE 100 MG/10ML IJ SOLN: 10.0000 mg | Freq: Once | INTRAMUSCULAR | Status: DC

## 2017-07-03 MED ORDER — FAMOTIDINE IN NACL 20-0.9 MG/50ML-% IV SOLN
20.0000 mg | Freq: Once | INTRAVENOUS | Status: AC
  Administered 2017-07-03: 20 mg via INTRAVENOUS

## 2017-07-03 MED ORDER — DEXAMETHASONE SODIUM PHOSPHATE 10 MG/ML IJ SOLN
10.0000 mg | Freq: Once | INTRAMUSCULAR | Status: AC
  Administered 2017-07-03: 10 mg via INTRAVENOUS

## 2017-07-03 MED ORDER — CALCITONIN (SALMON) 200 UNIT/ML IJ SOLN
3.9000 [IU]/kg | Freq: Every day | INTRAMUSCULAR | Status: DC
  Administered 2017-07-03: 300 [IU] via SUBCUTANEOUS
  Filled 2017-07-03: qty 1.5

## 2017-07-03 MED ORDER — ZOLEDRONIC ACID 4 MG/100ML IV SOLN
4.0000 mg | Freq: Once | INTRAVENOUS | Status: AC
  Administered 2017-07-03: 4 mg via INTRAVENOUS
  Filled 2017-07-03: qty 100

## 2017-07-03 MED ORDER — SODIUM CHLORIDE 0.9 % IV SOLN
80.0000 mg/m2 | Freq: Once | INTRAVENOUS | Status: AC
  Administered 2017-07-03: 150 mg via INTRAVENOUS
  Filled 2017-07-03: qty 25

## 2017-07-03 MED ORDER — SODIUM CHLORIDE 0.9 % IV SOLN
Freq: Once | INTRAVENOUS | Status: AC
Start: 2017-07-03 — End: 2017-07-03
  Administered 2017-07-03: 12:00:00 via INTRAVENOUS

## 2017-07-03 NOTE — Telephone Encounter (Signed)
Scheduled appts per 10/15 los. Patient is aware of appts.

## 2017-07-03 NOTE — Patient Instructions (Addendum)
[facility] Discharge Instructions for Patients Receiving Chemotherapy  Today you received the following chemotherapy agents Taxol and Zometa  To help prevent nausea and vomiting after your treatment, we encourage you to take your nausea medication as directed If you develop nausea and vomiting that is not controlled by your nausea medication, call the clinic.   BELOW ARE SYMPTOMS THAT SHOULD BE REPORTED IMMEDIATELY:  *FEVER GREATER THAN 100.5 F  *CHILLS WITH OR WITHOUT FEVER  NAUSEA AND VOMITING THAT IS NOT CONTROLLED WITH YOUR NAUSEA MEDICATION  *UNUSUAL SHORTNESS OF BREATH  *UNUSUAL BRUISING OR BLEEDING  TENDERNESS IN MOUTH AND THROAT WITH OR WITHOUT PRESENCE OF ULCERS  *URINARY PROBLEMS  *BOWEL PROBLEMS  UNUSUAL RASH Items with * indicate a potential emergency and should be followed up as soon as possible.  Feel free to call the clinic should you have any questions or concerns. The clinic phone number is [phone].  Please show the [facility] at check-in to the Emergency Department and triage nurse.  Calcitonin injection What is this medicine? CALCITONIN (kal si TOE nin) is a hormone. It helps control calcium in the body. It is used to treat Paget's disease, osteoporosis, and hypercalcemia. This medicine may be used for other purposes; ask your health care provider or pharmacist if you have questions. COMMON BRAND NAME(S): Miacalcin What should I tell my health care provider before I take this medicine? They need to know if you have any of these conditions: -bone cancer -low level of blood calcium -an unusual or allergic reaction to calcitonin, fish, other medicines, foods, dyes, or preservatives -pregnant or trying to get pregnant -breast-feeding How should I use this medicine? This medicine is for injection under the skin or into a muscle. You will be taught how to prepare and give this medicine. Use exactly as directed. Take your medicine  at regular intervals. Do not take your medicine more often than directed. It is important that you put your used needles and syringes in a special sharps container. Do not put them in a trash can. If you do not have a sharps container, call your pharmacist or healthcare provider to get one. Talk to your pediatrician regarding the use of this medicine in children. Special care may be needed. Patients over 16 years old may have a stronger reaction and need a smaller dose. Overdosage: If you think you have taken too much of this medicine contact a poison control center or emergency room at once. NOTE: This medicine is only for you. Do not share this medicine with others. What if I miss a dose? If you miss a dose, use it as soon as you can. If it is almost time for your next dose, use only that dose. Do not use double or extra doses. What may interact with this medicine? -lithium This list may not describe all possible interactions. Give your health care provider a list of all the medicines, herbs, non-prescription drugs, or dietary supplements you use. Also tell them if you smoke, drink alcohol, or use illegal drugs. Some items may interact with your medicine. What should I watch for while using this medicine? Visit your doctor or health care professional for regular checks on your progress. You will need regular blood tests while using this medicine. Talk to your doctor about your risk of cancer. You may be more at risk for certain types of cancers if you take this medicine. You may need to be on a special diet while taking  this medicine. Check with your doctor. Ask if you need to take extra calcium or vitamin D while taking this medicine. What side effects may I notice from receiving this medicine? Side effects that you should report to your doctor or health care professional as soon as possible: -allergic reactions like skin rash, itching or hives, swelling of the face, lips, or tongue -breathing  problems -chest pain, tightness -dizziness -fever, chills -tingling in the hands, feet Side effects that usually do not require medical attention (report to your doctor or health care professional if they continue or are bothersome): -back or joint pain -flushing -loss of appetite -nausea, vomiting -pain, swelling where injected -stomach pain -swollen feet -tremors This list may not describe all possible side effects. Call your doctor for medical advice about side effects. You may report side effects to FDA at 1-800-FDA-1088. Where should I keep my medicine? Keep out of the reach of children. Store in a refrigerator between 2 and 8 degrees C (36 and 46 degrees F). Do not freeze. Throw away any unused medicine after the expiration date. NOTE: This sheet is a summary. It may not cover all possible information. If you have questions about this medicine, talk to your doctor, pharmacist, or health care provider.  2018 Elsevier/Gold Standard (2012-11-29 12:10:57)

## 2017-07-03 NOTE — Telephone Encounter (Signed)
Left message for patient regarding upcoming appts. Scheduled per 10/15 sch msg.

## 2017-07-03 NOTE — Progress Notes (Signed)
Per Dr. Benay Spice: Treat with labs from 10/12. Repeat CMET to follow up hypercalcemia. Merleen Nicely, Infusion RN made aware.

## 2017-07-03 NOTE — Progress Notes (Signed)
[facility] OFFICE PROGRESS NOTE   Diagnosis: breast cancer  INTERVAL HISTORY:   Ms. Dworkin completed a first treatment with Taxol 06/26/2017. She received pamidronate the same day. No symptom of allergic reaction. She continues to have pain, relieved with hydrocodone and oxycodone. She complains of nausea beginning on the day following Taxol. No emesis. The nausea has persisted. No headache or neurologic symptoms.    Objective:  Vital signs in last 24 hours:  Blood pressure 119/74, pulse 87, temperature 98.5 F (36.9 C), temperature source Oral, resp. rate 18, height _0  (1.651 m), weight 169 lb 8 oz (76.9 kg), last menstrual period 10/08/2014, SpO2 100 %.    Resp: Lungs clear bilaterally Cardio: regular rate and rhythm GI: no hepatosplenomegaly, nontender Vascular: no leg edema  Skin:mild erythematous rash at the upper anterior chest     Lab Results:  Lab Results  Component Value Date   WBC 4.3 06/30/2017   HGB 11.4 (L) 06/30/2017   HCT 33.1 (L) 06/30/2017   MCV 102.5 (H) 06/30/2017   PLT 208 06/30/2017   NEUTROABS 3.5 06/30/2017    CMP     Component Value Date/Time   NA 138 06/30/2017 1043   K 3.7 06/30/2017 1043   CL 107 12/20/2012 0927   CO2 23 06/30/2017 1043   GLUCOSE 96 06/30/2017 1043   GLUCOSE 89 12/20/2012 0927   BUN 12.1 06/30/2017 1043   CREATININE 0.8 06/30/2017 1043   CALCIUM 13.2 (HH) 06/30/2017 1043   PROT 7.8 06/30/2017 1043   ALBUMIN 4.1 06/30/2017 1043   AST 69 (H) 06/30/2017 1043   ALT 49 06/30/2017 1043   ALKPHOS 154 (H) 06/30/2017 1043   BILITOT 1.02 06/30/2017 1043     Medications: I have reviewed the patient's current medications.  Assessment/Plan: 1. Multicentric invasive lobular carcinoma of the right breast diagnosed in December of 1999, a right mastectomy followed by adjuvant [facility] chemotherapy, 5 years of tamoxifen, and 5 years of Femara. The Femara was completed in June of 2010. The right breast mass  excision in January 2000 was ER positive, PR positive, and HER-2 negative  2. Pain at the left groin area with a CT of the pelvis on 02/07/12 confirming a destructive lytic lesion at the left pubic symphysis, status post a CT-guided biopsy on 02/16/12 confirming metastatic carcinoma, focally ER positive .  -PET scan 03/07/2012 confirmed multiple hypermetabolic bone metastases with no other evidence of metastatic disease  -Status post palliative radiation to the pelvis beginning on 03/12/2012  -Initiation of Arimidex on 03/27/2012 -initiation of every three-month xgeva on 04/04/2012  -PET scan September 14 [id number] new hypermetabolic bone lesions  -Initiation of Faslodex 09/27/2012  -Restaging PET scan 04/08/2013 with evidence of disease progression in the bones  -Initiation of Aromasin/afinitor 05/29/2013  -Iliac biopsy at [facility] 05/22/2013 confirming HER-2 negative metastatic breast  -PET scan 11/18/2013 with a decrease in hypermetabolic activity  -PET scan 10/08/2014 with progressive diffuse hypermetabolic bone metastases -Initiation of Palbociclib 10/20/2014; Faslodex 10/23/2014. -Palbociclib placed on hold beginning 11/07/2014 due to neutropenia. -Palbociclib resumed 11/27/2014 on an every other day schedule -Palbociclib resumed 01/04/2015 at a reduced dose of 100 mg daily for 21 days. -Palbociclib beginning 02/12/2015 at a dose of 100 mg daily for 14 days on/14 days off. -PET scan 05/18/2015 with diffuse hypermetabolic bone lesions, increased metabolic activity compared to the PET scan from January 2016 -Palbociclib resumed 06/07/2015, Faslodex continued 06/12/2015 -PET scan 09/22/2015 with diffuse hypermetabolic bone lesions, increased metabolic activity compared to  the PET scan 05/18/2015 -Continuation of palbociclib and Faslodex -palbociclib and Faslodex discontinued April 2017 secondary to a C7 spinous process fracture and hypercalcemia -PET scan 18/20/[id number]  hypermetabolic bone metastases, with increased metabolic activity interpreted as consistent with disease progression - Xeloda (7 days on/7 days off) started 02/28/2016 -PET scan 08/31/2016-marked improvement in hypermetabolic bone activity, no extra osseous disease -Xeloda continued -PET scan 06/05/2017-progression of bone metastase including a T3 lesion with paraspinal soft tissue, destructive lesion at T8 and progressive lesions at the shoulders, ribs, sternum, and pelvis -Xeloda discontinued 06/08/2017 -Initiation of weekly Taxol 06/26/2017 3. BRCA1 variant felt to be a polymorphism  4. Pain/tenderness at the left low anterolateral chest wall-she completed palliative radiation on 05/28/2012 with improvement in the pain. She was also treated with radiation to the thoracic spine.  5. Anemia/leukopenia-likely secondary to breast cancer involving the bone marrow and radiation /afinitor -the anemia is improved  6. History of mildly elevated liver enzymes-? Related to afinitor,? Secondary to metastatic breast cancer 7. Neutropenia secondary to Palbociclib. Improved. 8. Pain at the left shoulder and mid back-a plain x-ray of the left shoulder 06/12/2015 revealed a sclerotic density at the left scapula adjacent to the glenoid, there is metastatic disease involving the humeral head and left scapula on the PET scan 05/18/2015. She completed a course of radiation to the left shoulder on 08/05/2015. 9. Low neck/upper back pain 12/24/2015 with MRI showing a mildly displaced acute-appearing C7 spinous process fracture; widespread osseous metastatic disease; no evidence of epidural tumor or spinal cord compression. - Completed palliative radiation to the cervical spine 02/17/2016 10. Dysphagia/odynophagia secondary to radiation-resolved 11. Xeloda-induced skin rash 12. Hypercalcemia secondary to #1. Status post Zometa 06/08/2017 and 06/19/2017. Persistent hypercalcemia 06/26/2017. Pamidronate  06/26/2017.    Disposition:  Ms. Stegman began treatment with weekly Taxol 06/26/2017. She tolerated the Taxol well. The plan is to continue weekly Taxol. She developed nausea following Taxol. This could be related to chemotherapy or another etiology. We will add Zofran with the chemotherapy premedication today. She was given a prescription for Compazine.  The calcium level was improved on 06/30/2017. We will follow-up on the calcium level from today.  She is scheduled for another treatment with Taxol on 07/10/2017. She will return for an office and lab visit on 07/24/2017. 25 minutes were spent with the patient today. The majority of the time was used for counseling and coordination of care.  Donneta Romberg, MD  07/03/2017  9:39 AM

## 2017-07-04 ENCOUNTER — Other Ambulatory Visit: Payer: Self-pay | Admitting: Radiology

## 2017-07-04 ENCOUNTER — Ambulatory Visit (HOSPITAL_BASED_OUTPATIENT_CLINIC_OR_DEPARTMENT_OTHER): Payer: Medicare Other

## 2017-07-04 DIAGNOSIS — Z853 Personal history of malignant neoplasm of breast: Secondary | ICD-10-CM

## 2017-07-04 DIAGNOSIS — C7951 Secondary malignant neoplasm of bone: Secondary | ICD-10-CM

## 2017-07-04 DIAGNOSIS — C50919 Malignant neoplasm of unspecified site of unspecified female breast: Secondary | ICD-10-CM

## 2017-07-04 DIAGNOSIS — Z9889 Other specified postprocedural states: Secondary | ICD-10-CM

## 2017-07-04 MED ORDER — CALCITONIN (SALMON) 200 UNIT/ML IJ SOLN
4.0000 [IU]/kg | Freq: Once | INTRAMUSCULAR | Status: AC
  Administered 2017-07-04: 308 [IU] via SUBCUTANEOUS
  Filled 2017-07-04: qty 1.54

## 2017-07-04 NOTE — Patient Instructions (Signed)
Calcitonin injection What is this medicine? CALCITONIN (kal si TOE nin) is a hormone. It helps control calcium in the body. It is used to treat Paget's disease, osteoporosis, and hypercalcemia. This medicine may be used for other purposes; ask your health care provider or pharmacist if you have questions. COMMON BRAND NAME(S): Miacalcin What should I tell my health care provider before I take this medicine? They need to know if you have any of these conditions: -bone cancer -low level of blood calcium -an unusual or allergic reaction to calcitonin, fish, other medicines, foods, dyes, or preservatives -pregnant or trying to get pregnant -breast-feeding How should I use this medicine? This medicine is for injection under the skin or into a muscle. You will be taught how to prepare and give this medicine. Use exactly as directed. Take your medicine at regular intervals. Do not take your medicine more often than directed. It is important that you put your used needles and syringes in a special sharps container. Do not put them in a trash can. If you do not have a sharps container, call your pharmacist or healthcare provider to get one. Talk to your pediatrician regarding the use of this medicine in children. Special care may be needed. Patients over 65 years old may have a stronger reaction and need a smaller dose. Overdosage: If you think you have taken too much of this medicine contact a poison control center or emergency room at once. NOTE: This medicine is only for you. Do not share this medicine with others. What if I miss a dose? If you miss a dose, use it as soon as you can. If it is almost time for your next dose, use only that dose. Do not use double or extra doses. What may interact with this medicine? -lithium This list may not describe all possible interactions. Give your health care provider a list of all the medicines, herbs, non-prescription drugs, or dietary supplements you use.  Also tell them if you smoke, drink alcohol, or use illegal drugs. Some items may interact with your medicine. What should I watch for while using this medicine? Visit your doctor or health care professional for regular checks on your progress. You will need regular blood tests while using this medicine. Talk to your doctor about your risk of cancer. You may be more at risk for certain types of cancers if you take this medicine. You may need to be on a special diet while taking this medicine. Check with your doctor. Ask if you need to take extra calcium or vitamin D while taking this medicine. What side effects may I notice from receiving this medicine? Side effects that you should report to your doctor or health care professional as soon as possible: -allergic reactions like skin rash, itching or hives, swelling of the face, lips, or tongue -breathing problems -chest pain, tightness -dizziness -fever, chills -tingling in the hands, feet Side effects that usually do not require medical attention (report to your doctor or health care professional if they continue or are bothersome): -back or joint pain -flushing -loss of appetite -nausea, vomiting -pain, swelling where injected -stomach pain -swollen feet -tremors This list may not describe all possible side effects. Call your doctor for medical advice about side effects. You may report side effects to FDA at 1-800-FDA-1088. Where should I keep my medicine? Keep out of the reach of children. Store in a refrigerator between 2 and 8 degrees C (36 and 46 degrees F). Do not freeze. Throw  away any unused medicine after the expiration date. NOTE: This sheet is a summary. It may not cover all possible information. If you have questions about this medicine, talk to your doctor, pharmacist, or health care provider.  2018 Elsevier/Gold Standard (2012-11-29 12:10:57)

## 2017-07-05 ENCOUNTER — Other Ambulatory Visit: Payer: Self-pay | Admitting: *Deleted

## 2017-07-05 ENCOUNTER — Other Ambulatory Visit (HOSPITAL_BASED_OUTPATIENT_CLINIC_OR_DEPARTMENT_OTHER): Payer: Medicare Other

## 2017-07-05 ENCOUNTER — Telehealth: Payer: Self-pay

## 2017-07-05 ENCOUNTER — Ambulatory Visit (HOSPITAL_BASED_OUTPATIENT_CLINIC_OR_DEPARTMENT_OTHER): Payer: Medicare Other | Admitting: Oncology

## 2017-07-05 VITALS — BP 130/70 | HR 110 | Temp 98.0°F | Resp 18 | Ht 65.0 in | Wt 169.1 lb

## 2017-07-05 DIAGNOSIS — C50919 Malignant neoplasm of unspecified site of unspecified female breast: Secondary | ICD-10-CM

## 2017-07-05 DIAGNOSIS — C7951 Secondary malignant neoplasm of bone: Secondary | ICD-10-CM

## 2017-07-05 DIAGNOSIS — Z23 Encounter for immunization: Secondary | ICD-10-CM | POA: Diagnosis not present

## 2017-07-05 LAB — COMPREHENSIVE METABOLIC PANEL
ALBUMIN: 3.9 g/dL (ref 3.5–5.0)
ALK PHOS: 155 U/L — AB (ref 40–150)
ALT: 47 U/L (ref 0–55)
AST: 109 U/L — AB (ref 5–34)
Anion Gap: 10 mEq/L (ref 3–11)
BUN: 12 mg/dL (ref 7.0–26.0)
CO2: 21 meq/L — AB (ref 22–29)
Calcium: 13 mg/dL — ABNORMAL HIGH (ref 8.4–10.4)
Chloride: 108 mEq/L (ref 98–109)
Creatinine: 0.8 mg/dL (ref 0.6–1.1)
GLUCOSE: 140 mg/dL (ref 70–140)
POTASSIUM: 3.2 meq/L — AB (ref 3.5–5.1)
SODIUM: 139 meq/L (ref 136–145)
TOTAL PROTEIN: 7.6 g/dL (ref 6.4–8.3)
Total Bilirubin: 0.76 mg/dL (ref 0.20–1.20)

## 2017-07-05 MED ORDER — INFLUENZA VAC SPLIT HIGH-DOSE 0.5 ML IM SUSY
0.5000 mL | PREFILLED_SYRINGE | INTRAMUSCULAR | Status: AC
  Administered 2017-07-05: 0.5 mL via INTRAMUSCULAR
  Filled 2017-07-05: qty 0.5

## 2017-07-05 MED ORDER — ONDANSETRON HCL 8 MG PO TABS: 8.0000 mg | ORAL_TABLET | Freq: Three times a day (TID) | ORAL | 2 refills | Status: DC | PRN

## 2017-07-05 NOTE — Progress Notes (Signed)
[place] OFFICE PROGRESS NOTE   Diagnosis: Breast cancer  INTERVAL HISTORY:   Stacy Horn returns as scheduled. She completed another treatment with Taxol on 07/03/2017. She tolerated the Taxol well. No sign of an allergic reaction. No neuropathy symptoms. The bone pain is partially improved. She continues to have nausea, not relieved with Compazine. No emesis. She is constipated. She received intravenous fluids, Zometa, and calcitonin on 07/03/2017.  Objective:  Vital signs in last 24 hours:  Blood pressure 130/70, pulse (!) 110, temperature 98 F (36.7 C), temperature source Oral, resp. rate 18, height '5\' 5"'  (1.651 m), weight 169 lb 1.6 oz (76.7 kg), last menstrual period 10/08/2014, SpO2 99 %.    HEENT: No thrush Resp: Lungs clear bilaterally Cardio: Regular rate and rhythm GI: No hepatosplenomegaly, nontender Vascular: No leg edema    Lab Results:  Lab Results  Component Value Date   WBC 4.3 06/30/2017   HGB 11.4 (L) 06/30/2017   HCT 33.1 (L) 06/30/2017   MCV 102.5 (H) 06/30/2017   PLT 208 06/30/2017   NEUTROABS 3.5 06/30/2017    CMP     Component Value Date/Time   NA 139 07/05/2017 1000   K 3.2 (L) 07/05/2017 1000   CL 107 12/20/2012 0927   CO2 21 (L) 07/05/2017 1000   GLUCOSE 140 07/05/2017 1000   GLUCOSE 89 12/20/2012 0927   BUN 12.0 07/05/2017 1000   CREATININE 0.8 07/05/2017 1000   CALCIUM 13.0 (H) 07/05/2017 1000   PROT 7.6 07/05/2017 1000   ALBUMIN 3.9 07/05/2017 1000   AST 109 (H) 07/05/2017 1000   ALT 47 07/05/2017 1000   ALKPHOS 155 (H) 07/05/2017 1000   BILITOT 0.76 07/05/2017 1000     Medications: I have reviewed the patient's current medications.  Assessment/Plan: 1. Multicentric invasive lobular carcinoma of the right breast diagnosed in December of 1999, a right mastectomy followed by adjuvant [facility] chemotherapy, 5 years of tamoxifen, and 5 years of Femara. The Femara was completed in June of 2010. The right breast mass  excision in January 2000 was ER positive, PR positive, and HER-2 negative  2. Pain at the left groin area with a CT of the pelvis on 02/07/12 confirming a destructive lytic lesion at the left pubic symphysis, status post a CT-guided biopsy on 02/16/12 confirming metastatic carcinoma, focally ER positive .  -PET scan 03/07/2012 confirmed multiple hypermetabolic bone metastases with no other evidence of metastatic disease  -Status post palliative radiation to the pelvis beginning on 03/12/2012  -Initiation of Arimidex on 03/27/2012 -initiation of every three-month xgeva on 04/04/2012  -PET scan September 14 [id number] new hypermetabolic bone lesions  -Initiation of Faslodex 09/27/2012  -Restaging PET scan 04/08/2013 with evidence of disease progression in the bones  -Initiation of Aromasin/afinitor 05/29/2013  -Iliac biopsy at [facility] 05/22/2013 confirming HER-2 negative metastatic breast  -PET scan 11/18/2013 with a decrease in hypermetabolic activity  -PET scan 10/08/2014 with progressive diffuse hypermetabolic bone metastases -Initiation of Palbociclib 10/20/2014; Faslodex 10/23/2014. -Palbociclib placed on hold beginning 11/07/2014 due to neutropenia. -Palbociclib resumed 11/27/2014 on an every other day schedule -Palbociclib resumed 01/04/2015 at a reduced dose of 100 mg daily for 21 days. -Palbociclib beginning 02/12/2015 at a dose of 100 mg daily for 14 days on/14 days off. -PET scan 05/18/2015 with diffuse hypermetabolic bone lesions, increased metabolic activity compared to the PET scan from January 2016 -Palbociclib resumed 06/07/2015, Faslodex continued 06/12/2015 -PET scan 09/22/2015 with diffuse hypermetabolic bone lesions, increased metabolic activity compared to the  PET scan 05/18/2015 -Continuation of palbociclib and Faslodex -palbociclib and Faslodex discontinued April 2017 secondary to a C7 spinous process fracture and hypercalcemia -PET scan [id number]  hypermetabolic bone metastases, with increased metabolic activity interpreted as consistent with disease progression - Xeloda (7 days on/7 days off) started 02/28/2016 -PET scan 08/31/2016-marked improvement in hypermetabolic bone activity, no extra osseous disease -Xeloda continued -PET scan 06/05/2017-progression of bone metastase including a T3 lesion with paraspinal soft tissue, destructive lesion at T8 and progressive lesions at the shoulders, ribs, sternum, and pelvis -Xeloda discontinued 06/08/2017 -Initiation of weekly Taxol 06/26/2017 3. BRCA1 variant felt to be a polymorphism  4. Pain/tenderness at the left low anterolateral chest wall-she completed palliative radiation on 05/28/2012 with improvement in the pain. She was also treated with radiation to the thoracic spine.  5. Anemia/leukopenia-likely secondary to breast cancer involving the bone marrow and radiation /afinitor -the anemia is improved  6. History of mildly elevated liver enzymes-? Related to afinitor,? Secondary to metastatic breast cancer 7. Neutropenia secondary to Palbociclib. Improved. 8. Pain at the left shoulder and mid back-a plain x-ray of the left shoulder 06/12/2015 revealed a sclerotic density at the left scapula adjacent to the glenoid, there is metastatic disease involving the humeral head and left scapula on the PET scan 05/18/2015. She completed a course of radiation to the left shoulder on 08/05/2015. 9. Low neck/upper back pain 12/24/2015 with MRI showing a mildly displaced acute-appearing C7 spinous process fracture; widespread osseous metastatic disease; no evidence of epidural tumor or spinal cord compression. - Completed palliative radiation to the cervical spine 02/17/2016 10. Dysphagia/odynophagia secondary to radiation-resolved 11. Xeloda-induced skin rash 12. Hypercalcemia secondary to #1. Status post Zometa 06/08/2017, 06/19/2017, and 07/03/2017.hypercalcemia. Pamidronate 06/26/2017. Calcitonin  07/03/2017 and 07/04/2017    Disposition:  Stacy Horn appears stable. She has completed 2 weeks of Taxol chemotherapy. She has persistent hypercalcemia, though this has improved today. We will check a calcium level again tomorrow. The plan is to add carboplatin to the chemotherapy regimen if the calcium level remains elevated. I reviewed the potential toxicities associated with carboplatin and she agrees to proceed.  Stacy Horn received an influenza vaccine today. She will undergo placement of a Port-A-Cath tomorrow. She will return for an office visit prior to chemotherapy on 07/10/2017.  Donneta Romberg, MD  07/05/2017  11:51 AM

## 2017-07-05 NOTE — Telephone Encounter (Signed)
Printed patient avs and calender per MD. Request. Per 10/17 los provider overbook was approved by Md.

## 2017-07-06 ENCOUNTER — Other Ambulatory Visit: Payer: Self-pay | Admitting: Nurse Practitioner

## 2017-07-06 ENCOUNTER — Ambulatory Visit (HOSPITAL_COMMUNITY)
Admission: RE | Admit: 2017-07-06 | Discharge: 2017-07-06 | Disposition: A | Discharge status: Home / Self Care | Payer: Medicare Other | Source: Ambulatory Visit | Attending: Nurse Practitioner | Admitting: Nurse Practitioner

## 2017-07-06 ENCOUNTER — Other Ambulatory Visit: Payer: Self-pay | Admitting: Oncology

## 2017-07-06 ENCOUNTER — Encounter (HOSPITAL_COMMUNITY): Payer: Self-pay

## 2017-07-06 ENCOUNTER — Ambulatory Visit (HOSPITAL_BASED_OUTPATIENT_CLINIC_OR_DEPARTMENT_OTHER): Payer: Medicare Other

## 2017-07-06 ENCOUNTER — Encounter: Payer: Self-pay | Admitting: Nurse Practitioner

## 2017-07-06 ENCOUNTER — Ambulatory Visit (HOSPITAL_COMMUNITY)
Admission: RE | Admit: 2017-07-06 | Discharge: 2017-07-06 | Disposition: A | Discharge status: Home / Self Care | Payer: Medicare Other | Source: Ambulatory Visit | Attending: Oncology | Admitting: Oncology

## 2017-07-06 ENCOUNTER — Telehealth: Payer: Self-pay | Admitting: *Deleted

## 2017-07-06 ENCOUNTER — Other Ambulatory Visit (HOSPITAL_BASED_OUTPATIENT_CLINIC_OR_DEPARTMENT_OTHER): Payer: Medicare Other

## 2017-07-06 ENCOUNTER — Other Ambulatory Visit: Payer: Self-pay | Admitting: *Deleted

## 2017-07-06 DIAGNOSIS — C7951 Secondary malignant neoplasm of bone: Secondary | ICD-10-CM

## 2017-07-06 DIAGNOSIS — Z853 Personal history of malignant neoplasm of breast: Secondary | ICD-10-CM

## 2017-07-06 DIAGNOSIS — C50911 Malignant neoplasm of unspecified site of right female breast: Secondary | ICD-10-CM | POA: Diagnosis not present

## 2017-07-06 DIAGNOSIS — C50919 Malignant neoplasm of unspecified site of unspecified female breast: Secondary | ICD-10-CM

## 2017-07-06 DIAGNOSIS — Z170 Estrogen receptor positive status [ER+]: Secondary | ICD-10-CM | POA: Diagnosis not present

## 2017-07-06 DIAGNOSIS — Z5111 Encounter for antineoplastic chemotherapy: Secondary | ICD-10-CM | POA: Diagnosis not present

## 2017-07-06 DIAGNOSIS — Z880 Allergy status to penicillin: Secondary | ICD-10-CM | POA: Diagnosis not present

## 2017-07-06 DIAGNOSIS — M8580 Other specified disorders of bone density and structure, unspecified site: Secondary | ICD-10-CM | POA: Diagnosis not present

## 2017-07-06 HISTORY — PX: IR FLUORO GUIDE PORT INSERTION RIGHT: IMG5741

## 2017-07-06 HISTORY — PX: IR US GUIDE VASC ACCESS RIGHT: IMG2390

## 2017-07-06 LAB — COMPREHENSIVE METABOLIC PANEL
ALBUMIN: 4 g/dL (ref 3.5–5.0)
ALK PHOS: 177 U/L — AB (ref 40–150)
ALT: 53 U/L (ref 0–55)
AST: 88 U/L — AB (ref 5–34)
Anion Gap: 9 mEq/L (ref 3–11)
BILIRUBIN TOTAL: 0.93 mg/dL (ref 0.20–1.20)
BUN: 11.5 mg/dL (ref 7.0–26.0)
CO2: 24 meq/L (ref 22–29)
Calcium: 14.9 mg/dL (ref 8.4–10.4)
Chloride: 106 mEq/L (ref 98–109)
Creatinine: 0.8 mg/dL (ref 0.6–1.1)
EGFR: >60 mL/min/{1.73_m2} (ref >60)
GLUCOSE: 106 mg/dL (ref 70–140)
POTASSIUM: 3.9 meq/L (ref 3.5–5.1)
Sodium: 140 mEq/L (ref 136–145)
TOTAL PROTEIN: 7.8 g/dL (ref 6.4–8.3)

## 2017-07-06 LAB — CBC WITH DIFFERENTIAL/PLATELET
BASOS PCT: 0 %
Basophils Absolute: 0 10*3/uL (ref 0.0–0.1)
EOS ABS: 0 10*3/uL (ref 0.0–0.7)
EOS PCT: 0 %
HEMATOCRIT: 29 % — AB (ref 36.0–46.0)
Hemoglobin: 10.1 g/dL — ABNORMAL LOW (ref 12.0–15.0)
Lymphocytes Relative: 18 %
Lymphs Abs: 0.6 10*3/uL — ABNORMAL LOW (ref 0.7–4.0)
MCH: 34.2 pg — AB (ref 26.0–34.0)
MCHC: 34.8 g/dL (ref 30.0–36.0)
MCV: 98.3 fL (ref 78.0–100.0)
Monocytes Absolute: 0.2 10*3/uL (ref 0.1–1.0)
Monocytes Relative: 5 %
NEUTROS PCT: 77 %
Neutro Abs: 2.7 10*3/uL (ref 1.7–7.7)
Platelets: 207 10*3/uL (ref 150–400)
RBC: 2.95 MIL/uL — ABNORMAL LOW (ref 3.87–5.11)
RDW: 15 % (ref 11.5–15.5)
WBC: 3.5 10*3/uL — AB (ref 4.0–10.5)

## 2017-07-06 LAB — PROTIME-INR
INR: 0.93
Prothrombin Time: 12.4 seconds (ref 11.4–15.2)

## 2017-07-06 MED ORDER — FENTANYL CITRATE (PF) 100 MCG/2ML IJ SOLN
INTRAMUSCULAR | Status: AC
  Filled 2017-07-06: qty 4

## 2017-07-06 MED ORDER — LIDOCAINE-EPINEPHRINE (PF) 2 %-1:200000 IJ SOLN
INTRAMUSCULAR | Status: AC | PRN
  Administered 2017-07-06: 10 mL

## 2017-07-06 MED ORDER — LIDOCAINE HCL 1 % IJ SOLN
INTRAMUSCULAR | Status: AC | PRN
  Administered 2017-07-06: 10 mL

## 2017-07-06 MED ORDER — LIDOCAINE HCL 1 % IJ SOLN
INTRAMUSCULAR | Status: AC
  Filled 2017-07-06: qty 20

## 2017-07-06 MED ORDER — SODIUM CHLORIDE 0.9 % IV SOLN: INTRAVENOUS | Status: DC

## 2017-07-06 MED ORDER — CLINDAMYCIN PHOSPHATE 900 MG/50ML IV SOLN
900.0000 mg | Freq: Once | INTRAVENOUS | Status: AC
  Administered 2017-07-06: 900 mg via INTRAVENOUS

## 2017-07-06 MED ORDER — CLINDAMYCIN PHOSPHATE 900 MG/50ML IV SOLN
INTRAVENOUS | Status: AC
  Filled 2017-07-06: qty 50

## 2017-07-06 MED ORDER — LIDOCAINE-EPINEPHRINE (PF) 2 %-1:200000 IJ SOLN
INTRAMUSCULAR | Status: AC
  Filled 2017-07-06: qty 20

## 2017-07-06 MED ORDER — CALCITONIN (SALMON) 200 UNIT/ML IJ SOLN
600.0000 [IU] | Freq: Once | INTRAMUSCULAR | Status: AC
  Administered 2017-07-06: 600 [IU] via SUBCUTANEOUS
  Filled 2017-07-06 (×2): qty 3

## 2017-07-06 MED ORDER — SODIUM CHLORIDE 0.9 % IV SOLN
INTRAVENOUS | Status: DC
  Administered 2017-07-06: 12:00:00 via INTRAVENOUS

## 2017-07-06 MED ORDER — MIDAZOLAM HCL 2 MG/2ML IJ SOLN
INTRAMUSCULAR | Status: AC | PRN
Start: 2017-07-06 — End: 2017-07-06
  Administered 2017-07-06 (×2): 1 mg via INTRAVENOUS

## 2017-07-06 MED ORDER — FENTANYL CITRATE (PF) 100 MCG/2ML IJ SOLN
INTRAMUSCULAR | Status: AC | PRN
  Administered 2017-07-06 (×2): 25 ug via INTRAVENOUS
  Administered 2017-07-06: 50 ug via INTRAVENOUS

## 2017-07-06 MED ORDER — MIDAZOLAM HCL 2 MG/2ML IJ SOLN
INTRAMUSCULAR | Status: AC
  Filled 2017-07-06: qty 4

## 2017-07-06 MED ORDER — SODIUM CHLORIDE 0.9 % IV SOLN
INTRAVENOUS | Status: AC
  Administered 2017-07-06: 16:00:00 via INTRAVENOUS

## 2017-07-06 NOTE — Progress Notes (Signed)
Patient transported to Chemotherapy infusion at cancer [facility] for infusion with port accessed from IR.

## 2017-07-06 NOTE — Patient Instructions (Signed)
Dehydration, Adult Dehydration is when there is not enough fluid or water in your body. This happens when you lose more fluids than you take in. Dehydration can range from mild to very bad. It should be treated right away to keep it from getting very bad. Symptoms of mild dehydration may include:  Thirst.  Dry lips.  Slightly dry mouth.  Dry, warm skin.  Dizziness. Symptoms of moderate dehydration may include:  Very dry mouth.  Muscle cramps.  Dark pee (urine). Pee may be the color of tea.  Your body making less pee.  Your eyes making fewer tears.  Heartbeat that is uneven or faster than normal (palpitations).  Headache.  Light-headedness, especially when you stand up from sitting.  Fainting (syncope). Symptoms of very bad dehydration may include:  Changes in skin, such as: ? Cold and clammy skin. ? Blotchy (mottled) or pale skin. ? Skin that does not quickly return to normal after being lightly pinched and let go (poor skin turgor).  Changes in body fluids, such as: ? Feeling very thirsty. ? Your eyes making fewer tears. ? Not sweating when body temperature is high, such as in hot weather. ? Your body making very little pee.  Changes in vital signs, such as: ? Weak pulse. ? Pulse that is more than 100 beats a minute when you are sitting still. ? Fast breathing. ? Low blood pressure.  Other changes, such as: ? Sunken eyes. ? Cold hands and feet. ? Confusion. ? Lack of energy (lethargy). ? Trouble waking up from sleep. ? Short-term weight loss. ? Unconsciousness. Follow these instructions at home:  If told by your doctor, drink an ORS: ? Make an ORS by using instructions on the package. ? Start by drinking small amounts, about  cup (120 mL) every 5-10 minutes. ? Slowly drink more until you have had the amount that your doctor said to have.  Drink enough clear fluid to keep your pee clear or pale yellow. If you were told to drink an ORS, finish the  ORS first, then start slowly drinking clear fluids. Drink fluids such as: ? Water. Do not drink only water by itself. Doing that can make the salt (sodium) level in your body get too low (hyponatremia). ? Ice chips. ? Fruit juice that you have added water to (diluted). ? Low-calorie sports drinks.  Avoid: ? Alcohol. ? Drinks that have a lot of sugar. These include high-calorie sports drinks, fruit juice that does not have water added, and soda. ? Caffeine. ? Foods that are greasy or have a lot of fat or sugar.  Take over-the-counter and prescription medicines only as told by your doctor.  Do not take salt tablets. Doing that can make the salt level in your body get too high (hypernatremia).  Eat foods that have minerals (electrolytes). Examples include bananas, oranges, potatoes, tomatoes, and spinach.  Keep all follow-up visits as told by your doctor. This is important. Contact a doctor if:  You have belly (abdominal) pain that: ? Gets worse. ? Stays in one area (localizes).  You have a rash.  You have a stiff neck.  You get angry or annoyed more easily than normal (irritability).  You are more sleepy than normal.  You have a harder time waking up than normal.  You feel: ? Weak. ? Dizzy. ? Very thirsty.  You have peed (urinated) only a small amount of very dark pee during 6-8 hours. Get help right away if:  You have symptoms  of very bad dehydration.  You cannot drink fluids without throwing up (vomiting).  Your symptoms get worse with treatment.  You have a fever.  You have a very bad headache.  You are throwing up or having watery poop (diarrhea) and it: ? Gets worse. ? Does not go away.  You have blood or something green (bile) in your throw-up.  You have blood in your poop (stool). This may cause poop to look black and tarry.  You have not peed in 6-8 hours.  You pass out (faint).  Your heart rate when you are sitting still is more than 100 beats a  minute.  You have trouble breathing. This information is not intended to replace advice given to you by your health care provider. Make sure you discuss any questions you have with your health care provider. Document Released: 07/02/2009 Document Revised: 03/25/2016 Document Reviewed: 10/30/2015 Elsevier Interactive Patient Education  2018 [employer].      Hypercalcemia Hypercalcemia is having too much calcium in the blood. The body needs calcium to make bones and keep them strong. Calcium also helps the muscles, nerves, brain, and heart work the way they should. Most of the calcium in the body is in the bones. There is also some calcium in the blood. Hypercalcemia can happen when calcium comes out of the bones, or when the kidneys are not able to remove calcium from the blood. Hypercalcemia can be mild or severe. What are the causes? There are many possible causes of hypercalcemia. Common causes include:  Hyperparathyroidism. This is a condition in which the body produces too much parathyroid hormone. There are four parathyroid glands in your neck. These glands produce a chemical messenger (hormone) that helps the body absorb calcium from foods and helps your bones release calcium.  Certain kinds of cancer, such as lung cancer, breast cancer, or myeloma.  Less common causes of hypercalcemia include:  Getting too much calcium or vitamin D from your diet.  Kidney failure.  Hyperthyroidism.  Being on bed rest for a long time.  Certain medicines.  Infections.  Sarcoidosis.  What increases the risk? This condition is more likely to develop in:  Women.  People who are 60 years or older.  People who have a family history of hypercalcemia.  What are the signs or symptoms? Mild hypercalcemia that starts slowly may not cause symptoms. Severe, sudden hypercalcemia is more likely to cause symptoms, such as:  Loss of appetite.  Increased thirst and frequent  urination.  Fatigue.  Nausea and vomiting.  Headache.  Abdominal pain.  Muscle pain, twitching, or weakness.  Constipation.  Blood in the urine.  Pain in the side of the back (flank pain).  Anxiety, confusion, or depression.  Irregular heartbeat (arrhythmia).  Loss of consciousness.  How is this diagnosed? This condition may be diagnosed based on:  Your symptoms.  Blood tests.  Urine tests.  X-rays.  Ultrasound.  MRI.  CT scan.  How is this treated? Treatment for hypercalcemia depends on the cause. Treatment may include:  Receiving fluids through an IV tube.  Medicines that keep calcium levels steady after receiving fluids (loop diuretics).  Medicines that keep calcium in your bones (bisphosphonates).  Medicines that lower the calcium level in your blood.  Surgery to remove overactive parathyroid glands.  Follow these instructions at home:  Take over-the-counter and prescription medicines only as told by your health care provider.  Follow instructions from your health care provider about eating or drinking restrictions.  Drink enough fluid to keep your urine clear or pale yellow.  Stay active. Weight-bearing exercise helps to keep calcium in your bones. Follow instructions from your health care provider about what type and level of exercise is safe for you.  Keep all follow-up visits as told by your health care provider. This is important. Contact a health care provider if:  You have a fever.  You have flank or abdominal pain that is getting worse. Get help right away if:  You have severe abdominal or flank pain.  You have chest pain.  You have trouble breathing.  You become very confused and sleepy.  You lose consciousness. This information is not intended to replace advice given to you by your health care provider. Make sure you discuss any questions you have with your health care provider. Document Released: 11/19/2004 Document  Revised: 02/11/2016 Document Reviewed: 01/21/2015 Elsevier Interactive Patient Education  2018 [employer].

## 2017-07-06 NOTE — Sedation Documentation (Signed)
Pt reports continued back pain.

## 2017-07-06 NOTE — Consult Note (Signed)
Chief Complaint: Patient was seen in consultation today for Port-A-Cath placement  Referring Physician(s): Stacy Horn  Supervising Physician: Stacy Horn  Patient Status: Stacy Horn  History of Present Illness: Stacy Horn is a 66 y.o. female with history of right breast carcinoma diagnosed in 1999, status post surgery/chemoradiation. Recent PET scan 06/05/17 revealed interval progression of bony metastatic disease. She  presents today for Port-A-Cath placement for additional chemotherapy. She is hypercalcemic.  Past Medical History:  Diagnosis Date  . Breast cancer metastasized to bone ([place]) 04/04/2012  . History of chemotherapy    adjuvant AC, Tamoxifen, Femara-completed 02/2009  . History of rib fracture    3 ribs, right side  . Hx of breast biopsy 08/19/1992   right breast-lobular carcinoma in-situ  . Hx of breast biopsy 08/20/1998   right breast-malignant cells  . Osteopenia     Past Surgical History:  Procedure Laterality Date  . BONE BIOPSY  02/16/12   pelvic- metastatic carcinoma, ER+  . MASTECTOMY  10/07/1998   bilateral, R-inv carcinoma, L- fibroadenoma, ER/PR +, HER2 -    Allergies: Amoxicillin and Penicillins  Medications: Prior to Admission medications   Medication Sig Start Date End Date Taking? Authorizing Provider  BIOTIN PO Take 1 tablet by mouth daily.    [provider]  HYDROcodone-acetaminophen (NORCO) 7.5-325 MG tablet Take 1-2 tablets by mouth every 4 (four) hours as needed for moderate pain. 06/08/17   Stacy Pier, MD  loratadine (CLARITIN) 10 MG tablet Take 10 mg by mouth daily.    [provider]  Multiple Vitamins-Minerals (CENTRUM SILVER PO) Take 1 tablet by mouth daily.    [provider]  Omega-3 Fatty Acids (FISH OIL) 1200 MG CAPS Take 1,200 mg by mouth 2 (two) times daily.    [provider]  ondansetron (ZOFRAN) 8 MG tablet Take 1 tablet (8 mg total) by mouth every 8 (eight) hours as needed  for nausea or vomiting. 07/05/17   Stacy Pier, MD  oxyCODONE-acetaminophen (PERCOCET/ROXICET) 5-325 MG tablet Take 1-2 tablets by mouth every 6 (six) hours as needed for severe pain. 06/26/17   Stacy Shark, NP  potassium chloride SA (K-DUR,KLOR-CON) 20 MEQ tablet Take 1 tablet (20 mEq total) by mouth daily. 06/12/17 07/12/17  Stacy Pier, MD  prochlorperazine (COMPAZINE) 5 MG tablet Take 1 tablet (5 mg total) by mouth every 6 (six) hours as needed for nausea or vomiting. 07/03/17   Stacy Pier, MD  pyridOXINE (VITAMIN B-6) 100 MG tablet Take 100 mg by mouth 2 (two) times daily.    [provider]  rizatriptan (MAXALT) 10 MG tablet Take 10 mg by mouth as needed. May repeat in 2 hours if needed    [provider]  sennosides-docusate sodium (SENOKOT-S) 8.6-50 MG tablet Take 2 tablets by mouth as needed for constipation (2 once a day occasionaly for constipation).    [provider]  triamcinolone cream (KENALOG) 0.1 % Apply 1 application topically 2 (two) times daily. 09/02/16   Stacy Pier, MD     History reviewed. No pertinent family history.  Social History   Social History  . Marital status: Married    Spouse name: N/A  . Number of children: N/A  . Years of education: N/A   Social History Main Topics  . Smoking status: None  . Smokeless tobacco: None  . Alcohol use None  . Drug use: Unknown  . Sexual activity: Not Asked   Other Topics Concern  .  None   Social History Narrative  . None      Review of Systems she currently denies significant fever, chest pain, dyspnea, cough, significant abdominal pain, or bleeding. She does have occasional headaches, back pain, constipation, intermittent nausea/ vomiting and some LE neuropathy  Vital Signs: LMP 10/08/2014   Physical Exam awake, alert. Chest clear to auscultation bilaterally. Heart with tachycardia but regular rhythm, abdomen soft, positive bowel sounds, nontender; no  significant lower extremity edema.  Imaging: No results found.  Labs:  CBC:  Recent Labs  04/13/17 0810 06/08/17 0807 06/26/17 1333 06/30/17 1043  WBC 3.8* 4.8 5.5 4.3  HGB 13.8 12.1 11.6 11.4*  HCT 40.5 34.9 33.7* 33.1*  PLT 166 164 196 208    COAGS: No results for input(s): INR, APTT in the last 8760 hours.  BMP:  Recent Labs  06/30/17 1043 07/03/17 0918 07/05/17 1000 07/06/17 1042  NA 138 137 139 140  K 3.7 3.8 3.2* 3.9  CO2 23 23 21* 24  GLUCOSE 96 103 140 106  BUN 12.1 14.9 12.0 11.5  CALCIUM 13.2* 15.3* 13.0* 14.9*  CREATININE 0.8 0.9 0.8 0.8    LIVER FUNCTION TESTS:  Recent Labs  06/30/17 1043 07/03/17 0918 07/05/17 1000 07/06/17 1042  BILITOT 1.02 0.71 0.76 0.93  AST 69* 83* 109* 88*  ALT 49 44 47 53  ALKPHOS 154* 159* 155* 177*  PROT 7.8 8.0 7.6 7.8  ALBUMIN 4.1 4.1 3.9 4.0    TUMOR MARKERS: No results for input(s): AFPTM, CEA, CA199, CHROMGRNA in the last 8760 hours.  Assessment and Plan: 66 y.o. female with history of right breast carcinoma diagnosed in 1999, status post surgery/chemoradiation. Recent PET scan 06/05/17 revealed interval progression of bony metastatic disease. She presents today for Port-A-Cath placement for additional chemotherapy.She is hypercalcemic. Risks and benefits discussed with the patient/spouse including, but not limited to bleeding, infection, pneumothorax, or fibrin sheath development and need for additional procedures.All of the patient's questions were answered, patient is agreeable to proceed.Consent signed and in chart.     Thank you for this interesting consult.  I greatly enjoyed meeting Stacy Horn and look forward to participating in their care.  A copy of this report was sent to the requesting provider on this date.  Electronically Signed: D. Rowe Robert, PA-C 07/06/2017, 11:30 AM   I spent a total of 25 minutes  in face to face in clinical consultation, greater than 50% of which was  counseling/coordinating care for Port-A-Cath placement

## 2017-07-06 NOTE — Telephone Encounter (Signed)
CMET reviewed by Dr. Benay Spice. Hypercalcemia noted at 14.9. Order received for IV fluids today with calcitonin injection. Return 10/19 for labs/office visit and IV fluids.  Called radiology, spoke with Tiffany: requested they leave pt's port accessed for IV fluids today. Called pt's husband with instructions to check in at [facility] after port placement. He voiced understanding.

## 2017-07-06 NOTE — Discharge Instructions (Signed)
Moderate Conscious Sedation, Adult, Care After These instructions provide you with information about caring for yourself after your procedure. Your health care provider may also give you more specific instructions. Your treatment has been planned according to current medical practices, but problems sometimes occur. Call your health care provider if you have any problems or questions after your procedure. What can I expect after the procedure? After your procedure, it is common:  To feel sleepy for several hours.  To feel clumsy and have poor balance for several hours.  To have poor judgment for several hours.  To vomit if you eat too soon.  Follow these instructions at home: For at least 24 hours after the procedure:   Do not: ? Participate in activities where you could fall or become injured. ? Drive. ? Use heavy machinery. ? Drink alcohol. ? Take sleeping pills or medicines that cause drowsiness. ? Make important decisions or sign legal documents. ? Take care of children on your own.  Rest. Eating and drinking  Follow the diet recommended by your health care provider.  If you vomit: ? Drink water, juice, or soup when you can drink without vomiting. ? Make sure you have little or no nausea before eating solid foods. General instructions  Have a responsible adult stay with you until you are awake and alert.  Take over-the-counter and prescription medicines only as told by your health care provider.  If you smoke, do not smoke without supervision.  Keep all follow-up visits as told by your health care provider. This is important. Contact a health care provider if:  You keep feeling nauseous or you keep vomiting.  You feel light-headed.  You develop a rash.  You have a fever. Get help right away if:  You have trouble breathing. This information is not intended to replace advice given to you by your health care provider. Make sure you discuss any questions you have  with your health care provider. Document Released: 06/26/2013 Document Revised: 02/08/2016 Document Reviewed: 12/26/2015 Elsevier Interactive Patient Education  2018 [place] Insertion, Care After This sheet gives you information about how to care for yourself after your procedure. Your health care provider may also give you more specific instructions. If you have problems or questions, contact your health care provider. What can I expect after the procedure? After your procedure, it is common to have:  Discomfort at the port insertion site.  Bruising on the skin over the port. This should improve over 3-4 days.  Follow these instructions at home: [facility] care  After your port is placed, you will get a manufacturer's information card. The card has information about your port. Keep this card with you at all times.  Take care of the port as told by your health care provider. Ask your health care provider if you or a family member can get training for taking care of the port at home. A home health care nurse may also take care of the port.  Make sure to remember what type of port you have. Incision care  Follow instructions from your health care provider about how to take care of your port insertion site. Make sure you: ? Wash your hands with soap and water before you change your bandage (dressing). If soap and water are not available, use hand sanitizer. ? Change your dressing as told by your health care provider.  You may remove your dressing tomorrow 07/07/17. ? Leave skin glue in place. These skin  closures may need to stay in place for 2 weeks or longer.  Do not remove completely unless your health care provider tells you to do that.  Do not use EMLA cream for 2 weeks after port placement as this cream will remove surgical glue.   Check your port insertion site every day for signs of infection. Check for: ? More redness, swelling, or pain. ? More fluid or  blood. ? Warmth. ? Pus or a bad smell. General instructions  Do not take baths, swim, or use a hot tub until your health care provider approves.  You may shower tomorrow 07/07/17.  Do not lift anything that is heavier than 10 lb (4.5 kg) for a week, or as told by your health care provider.  Ask your health care provider when it is okay to: ? Return to work or school. ? Resume usual physical activities or sports.  Do not drive for 24 hours if you were given a medicine to help you relax (sedative).  Take over-the-counter and prescription medicines only as told by your health care provider.  Wear a medical alert bracelet in case of an emergency. This will tell any health care providers that you have a port.  Keep all follow-up visits as told by your health care provider. This is important. Contact a health care provider if:  You cannot flush your port with saline as directed, or you cannot draw blood from the port.  You have a fever or chills.  You have more redness, swelling, or pain around your port insertion site.  You have more fluid or blood coming from your port insertion site.  Your port insertion site feels warm to the touch.  You have pus or a bad smell coming from the port insertion site. Get help right away if:  You have chest pain or shortness of breath.  You have bleeding from your port that you cannot control. Summary  Take care of the port as told by your health care provider.  Change your dressing as told by your health care provider.  Keep all follow-up visits as told by your health care provider. This information is not intended to replace advice given to you by your health care provider. Make sure you discuss any questions you have with your health care provider. Document Released: 06/26/2013 Document Revised: 07/27/2016 Document Reviewed: 07/27/2016 Elsevier Interactive Patient Education  2017 [employer].

## 2017-07-06 NOTE — Procedures (Signed)
Breast ca  S/P LT IJ POWER PORT  Tip svcra No comp Stable Ready for use Full report in pacs [id number]

## 2017-07-07 ENCOUNTER — Telehealth: Payer: Self-pay | Admitting: Emergency Medicine

## 2017-07-07 ENCOUNTER — Ambulatory Visit (HOSPITAL_BASED_OUTPATIENT_CLINIC_OR_DEPARTMENT_OTHER): Payer: Medicare Other

## 2017-07-07 ENCOUNTER — Ambulatory Visit (HOSPITAL_BASED_OUTPATIENT_CLINIC_OR_DEPARTMENT_OTHER): Payer: Medicare Other | Admitting: Nurse Practitioner

## 2017-07-07 ENCOUNTER — Ambulatory Visit (HOSPITAL_BASED_OUTPATIENT_CLINIC_OR_DEPARTMENT_OTHER): Payer: BLUE CROSS/BLUE SHIELD

## 2017-07-07 ENCOUNTER — Telehealth: Payer: Self-pay | Admitting: Nurse Practitioner

## 2017-07-07 ENCOUNTER — Telehealth: Payer: Self-pay

## 2017-07-07 VITALS — BP 106/63 | HR 91

## 2017-07-07 VITALS — BP 119/62 | HR 112 | Temp 98.9°F | Resp 20 | Ht 65.0 in | Wt 169.9 lb

## 2017-07-07 DIAGNOSIS — C7951 Secondary malignant neoplasm of bone: Secondary | ICD-10-CM

## 2017-07-07 DIAGNOSIS — Z9889 Other specified postprocedural states: Secondary | ICD-10-CM

## 2017-07-07 DIAGNOSIS — Z853 Personal history of malignant neoplasm of breast: Secondary | ICD-10-CM | POA: Diagnosis not present

## 2017-07-07 DIAGNOSIS — C50919 Malignant neoplasm of unspecified site of unspecified female breast: Secondary | ICD-10-CM | POA: Diagnosis not present

## 2017-07-07 DIAGNOSIS — Z95828 Presence of other vascular implants and grafts: Secondary | ICD-10-CM

## 2017-07-07 LAB — COMPREHENSIVE METABOLIC PANEL
ALBUMIN: 3.7 g/dL (ref 3.5–5.0)
ALT: 50 U/L (ref 0–55)
AST: 65 U/L — ABNORMAL HIGH (ref 5–34)
Alkaline Phosphatase: 151 U/L — ABNORMAL HIGH (ref 40–150)
Anion Gap: 9 mEq/L (ref 3–11)
BILIRUBIN TOTAL: 0.81 mg/dL (ref 0.20–1.20)
BUN: 9.2 mg/dL (ref 7.0–26.0)
CO2: 22 mEq/L (ref 22–29)
CREATININE: 0.7 mg/dL (ref 0.6–1.1)
Calcium: 12.5 mg/dL — ABNORMAL HIGH (ref 8.4–10.4)
Chloride: 107 mEq/L (ref 98–109)
Glucose: 107 mg/dl (ref 70–140)
Potassium: 3.4 mEq/L — ABNORMAL LOW (ref 3.5–5.1)
SODIUM: 139 meq/L (ref 136–145)
TOTAL PROTEIN: 7.4 g/dL (ref 6.4–8.3)

## 2017-07-07 MED ORDER — SODIUM CHLORIDE 0.9 % IV SOLN
INTRAVENOUS | Status: DC
  Administered 2017-07-07: 13:00:00 via INTRAVENOUS

## 2017-07-07 MED ORDER — OXYCODONE-ACETAMINOPHEN 5-325 MG PO TABS
ORAL_TABLET | ORAL | Status: AC
  Filled 2017-07-07: qty 1

## 2017-07-07 MED ORDER — OXYCODONE-ACETAMINOPHEN 5-325 MG PO TABS
1.0000 | ORAL_TABLET | Freq: Once | ORAL | Status: AC
  Administered 2017-07-07: 1 via ORAL

## 2017-07-07 MED ORDER — SODIUM CHLORIDE 0.9% FLUSH
10.0000 mL | INTRAVENOUS | Status: DC | PRN
  Administered 2017-07-07: 10 mL via INTRAVENOUS
  Filled 2017-07-07: qty 10

## 2017-07-07 MED ORDER — SODIUM CHLORIDE 0.9% FLUSH
10.0000 mL | INTRAVENOUS | Status: DC | PRN
  Administered 2017-07-07: 10 mL
  Filled 2017-07-07: qty 10

## 2017-07-07 MED ORDER — DENOSUMAB 120 MG/1.7ML ~~LOC~~ SOLN
120.0000 mg | Freq: Once | SUBCUTANEOUS | Status: AC
Start: 2017-07-07 — End: 2017-07-07
  Administered 2017-07-07: 120 mg via SUBCUTANEOUS
  Filled 2017-07-07: qty 1.7

## 2017-07-07 MED ORDER — HEPARIN SOD (PORK) LOCK FLUSH 100 UNIT/ML IV SOLN
500.0000 [IU] | Freq: Once | INTRAVENOUS | Status: AC
  Administered 2017-07-07: 500 [IU] via INTRAVENOUS
  Filled 2017-07-07: qty 5

## 2017-07-07 NOTE — Progress Notes (Signed)
[facility] OFFICE PROGRESS NOTE   Diagnosis:  Breast cancer  INTERVAL HISTORY:   Stacy Horn returns prior to scheduled follow-up due to persistent hypercalcemia. Calcium yesterday returned at 14.9. She received IV fluids and calcitonin. She remains fatigued. She had a bowel movement last night. She continues Senokot S. She plans to begin Miralax. The nausea is better. Zofran has been effective. She denies any urinary symptoms. Main pain is located in the left groin region. She alternates hydrocodone and Percocet depending on the level of pain.  Objective:  Vital signs in last 24 hours:  Blood pressure 119/62, pulse (!) 112, temperature 98.9 F (37.2 C), temperature source Oral, resp. rate 20, height 5\' 5"  (1.651 m), weight 169 lb 14.4 oz (77.1 kg), last menstrual period 10/08/2014, SpO2 98 %.    HEENT: no thrush or ulcers. Resp: lungs clear bilaterally. Cardio: regular rate and rhythm. GI: abdomen soft and nontender. No hepatomegaly. Vascular: no leg edema. Neuro: Alert and oriented. Skin surrounding the Port-A-Cath with patchy erythema consistent with a tape reaction.  Lab Results:  Lab Results  Component Value Date   WBC 3.5 (L) 07/06/2017   HGB 10.1 (L) 07/06/2017   HCT 29.0 (L) 07/06/2017   MCV 98.3 07/06/2017   PLT 207 07/06/2017   NEUTROABS 2.7 07/06/2017    Imaging:  Ir 07/08/2017 Guide Vasc Access Right  Result Date: 07/06/2017 CLINICAL DATA:  Metastatic breast cancer, access for chemotherapy EXAM: LEFT INTERNAL JUGULAR SINGLE LUMEN POWER PORT CATHETER INSERTION Date:  10/18/201810/18/2018 3:02 pm Radiologist:  M. 07/08/2017, MD Guidance:  ULTRASOUND AND FLUOROSCOPIC MEDICATIONS: Clindamycin 600 mg IV; The antibiotic was administered within an appropriate time interval prior to skin puncture. ANESTHESIA/SEDATION: Versed 2.0 mg IV; Fentanyl 100 mcg IV; Moderate Sedation Time:  51 MINUTES The patient was continuously monitored during the procedure by the  interventional radiology nurse under my direct supervision. FLUOROSCOPY TIME:  1 minutes, 6 seconds (6 mGy) COMPLICATIONS: None immediate. CONTRAST:  None. PROCEDURE: Informed consent was obtained from the patient following explanation of the procedure, risks, benefits and alternatives. The patient understands, agrees and consents for the procedure. All questions were addressed. A time out was performed. Maximal barrier sterile technique utilized including caps, mask, sterile gowns, sterile gloves, large sterile drape, hand hygiene, and 2% chlorhexidine scrub. Under sterile conditions and local anesthesia, left internal jugular micropuncture venous access was performed. Access was performed with ultrasound. Images were obtained for documentation. A guide wire was inserted followed by a transitional dilator. This allowed insertion of a guide wire and catheter into the IVC. Measurements were obtained from the SVC / RA junction back to the left IJ venotomy site. In the left infraclavicular chest, a subcutaneous pocket was created over the second anterior rib. This was done under sterile conditions and local anesthesia. 1% lidocaine with epinephrine was utilized for this. A 2.5 cm incision was made in the skin. Blunt dissection was performed to create a subcutaneous pocket over the right pectoralis major muscle. The pocket was flushed with saline vigorously. There was adequate hemostasis. The port catheter was assembled and checked for leakage. The port catheter was secured in the pocket with two retention sutures. The tubing was tunneled subcutaneously to the left venotomy site and inserted into the SVC/RA junction through a valved peel-away sheath. Position was confirmed with fluoroscopy. Images were obtained for documentation. The patient tolerated the procedure well. No immediate complications. Incisions were closed in a two layer fashion with 4 - 0 Vicryl  suture. Dermabond was applied to the skin. The port catheter  was accessed, blood was aspirated followed by saline and heparin flushes. Needle was removed. A dry sterile dressing was applied. IMPRESSION: Ultrasound and fluoroscopically guided left internal jugular single lumen power port catheter insertion. Tip in the SVC/RA junction. Catheter ready for use. Electronically Signed   By: Judie Petit.  Shick M.D.   On: 07/06/2017 15:06   Ir Fluoro Guide Port Insertion Right  Result Date: 07/06/2017 CLINICAL DATA:  Metastatic breast cancer, access for chemotherapy EXAM: LEFT INTERNAL JUGULAR SINGLE LUMEN POWER PORT CATHETER INSERTION Date:  10/18/201810/18/2018 3:02 pm Radiologist:  M. Ruel Favors, MD Guidance:  ULTRASOUND AND FLUOROSCOPIC MEDICATIONS: Clindamycin 600 mg IV; The antibiotic was administered within an appropriate time interval prior to skin puncture. ANESTHESIA/SEDATION: Versed 2.0 mg IV; Fentanyl 100 mcg IV; Moderate Sedation Time:  46 MINUTES The patient was continuously monitored during the procedure by the interventional radiology nurse under my direct supervision. FLUOROSCOPY TIME:  1 minutes, 6 seconds (6 mGy) COMPLICATIONS: None immediate. CONTRAST:  None. PROCEDURE: Informed consent was obtained from the patient following explanation of the procedure, risks, benefits and alternatives. The patient understands, agrees and consents for the procedure. All questions were addressed. A time out was performed. Maximal barrier sterile technique utilized including caps, mask, sterile gowns, sterile gloves, large sterile drape, hand hygiene, and 2% chlorhexidine scrub. Under sterile conditions and local anesthesia, left internal jugular micropuncture venous access was performed. Access was performed with ultrasound. Images were obtained for documentation. A guide wire was inserted followed by a transitional dilator. This allowed insertion of a guide wire and catheter into the IVC. Measurements were obtained from the SVC / RA junction back to the left IJ venotomy site. In  the left infraclavicular chest, a subcutaneous pocket was created over the second anterior rib. This was done under sterile conditions and local anesthesia. 1% lidocaine with epinephrine was utilized for this. A 2.5 cm incision was made in the skin. Blunt dissection was performed to create a subcutaneous pocket over the right pectoralis major muscle. The pocket was flushed with saline vigorously. There was adequate hemostasis. The port catheter was assembled and checked for leakage. The port catheter was secured in the pocket with two retention sutures. The tubing was tunneled subcutaneously to the left venotomy site and inserted into the SVC/RA junction through a valved peel-away sheath. Position was confirmed with fluoroscopy. Images were obtained for documentation. The patient tolerated the procedure well. No immediate complications. Incisions were closed in a two layer fashion with 4 - 0 Vicryl suture. Dermabond was applied to the skin. The port catheter was accessed, blood was aspirated followed by saline and heparin flushes. Needle was removed. A dry sterile dressing was applied. IMPRESSION: Ultrasound and fluoroscopically guided left internal jugular single lumen power port catheter insertion. Tip in the SVC/RA junction. Catheter ready for use. Electronically Signed   By: Judie Petit.  Shick M.D.   On: 07/06/2017 15:06    Medications: I have reviewed the patient's current medications.  Assessment/Plan: 1. Multicentric invasive lobular carcinoma of the right breast diagnosed in December of 1999, a right mastectomy followed by adjuvant [facility] chemotherapy, 5 years of tamoxifen, and 5 years of Femara. The Femara was completed in June of 2010. The right breast mass excision in January 2000 was ER positive, PR positive, and HER-2 negative  2. Pain at the left groin area with a CT of the pelvis on 02/07/12 confirming a destructive lytic lesion at the left pubic  symphysis, status post a CT-guided biopsy on 02/16/12  confirming metastatic carcinoma, focally ER positive .  -PET scan 03/07/2012 confirmed multiple hypermetabolic bone metastases with no other evidence of metastatic disease  -Status post palliative radiation to the pelvis beginning on 03/12/2012  -Initiation of Arimidex on 03/27/2012 -initiation of every three-month xgeva on 04/04/2012  -PET scan September 14 [id number] new hypermetabolic bone lesions  -Initiation of Faslodex 09/27/2012  -Restaging PET scan 04/08/2013 with evidence of disease progression in the bones  -Initiation of Aromasin/afinitor 05/29/2013  -Iliac biopsy at [facility] 05/22/2013 confirming HER-2 negative metastatic breast  -PET scan 11/18/2013 with a decrease in hypermetabolic activity  -PET scan 10/08/2014 with progressive diffuse hypermetabolic bone metastases -Initiation of Palbociclib 10/20/2014; Faslodex 10/23/2014. -Palbociclib placed on hold beginning 11/07/2014 due to neutropenia. -Palbociclib resumed 11/27/2014 on an every other day schedule -Palbociclib resumed 01/04/2015 at a reduced dose of 100 mg daily for 21 days. -Palbociclib beginning 02/12/2015 at a dose of 100 mg daily for 14 days on/14 days off. -PET scan 05/18/2015 with diffuse hypermetabolic bone lesions, increased metabolic activity compared to the PET scan from January 2016 -Palbociclib resumed 06/07/2015, Faslodex continued 06/12/2015 -PET scan 09/22/2015 with diffuse hypermetabolic bone lesions, increased metabolic activity compared to the PET scan 05/18/2015 -Continuation of palbociclib and Faslodex -palbociclib and Faslodex discontinued April 2017 secondary to a C7 spinous process fracture and hypercalcemia -PET scan [id number] hypermetabolic bone metastases, with increased metabolic activity interpreted as consistent with disease progression - Xeloda (7 days on/7 days off) started 02/28/2016 -PET scan 08/31/2016-marked improvement in hypermetabolic bone activity, no extra osseous  disease -Xeloda continued -PET scan 06/05/2017-progression of bone metastase including a T3 lesion with paraspinal soft tissue, destructive lesion at T8 and progressive lesions at the shoulders, ribs, sternum, and pelvis -Xeloda discontinued 06/08/2017 -Initiation of weekly Taxol 06/26/2017 3. BRCA1 variant felt to be a polymorphism  4. Pain/tenderness at the left low anterolateral chest wall-she completed palliative radiation on 05/28/2012 with improvement in the pain. She was also treated with radiation to the thoracic spine.  5. Anemia/leukopenia-likely secondary to breast cancer involving the bone marrow and radiation /afinitor -the anemia is improved  6. History of mildly elevated liver enzymes-? Related to afinitor,? Secondary to metastatic breast cancer 7. Neutropenia secondary to Palbociclib. Improved. 8. Pain at the left shoulder and mid back-a plain x-ray of the left shoulder 06/12/2015 revealed a sclerotic density at the left scapula adjacent to the glenoid, there is metastatic disease involving the humeral head and left scapula on the PET scan 05/18/2015. She completed a course of radiation to the left shoulder on 08/05/2015. 9. Low neck/upper back pain 12/24/2015 with MRI showing a mildly displaced acute-appearing C7 spinous process fracture; widespread osseous metastatic disease; no evidence of epidural tumor or spinal cord compression. - Completed palliative radiation to the cervical spine 02/17/2016 10. Dysphagia/odynophagia secondary to radiation-resolved 11. Xeloda-induced skin rash 12. Hypercalcemia secondary to #1. Status post Zometa 06/08/2017, 06/19/2017, and 07/03/2017.hypercalcemia. Pamidronate 06/26/2017. Calcitonin 07/03/2017, 07/04/2017, 07/06/2017; improved 07/07/2017; IV fluids and Xgeva 07/07/2017   Disposition: Stacy Horn appears stable. The calcium level is better but remains elevated. I reviewed today's labs with Dr. Benay Spice. Plan to proceed with IV fluids and  Xgeva. She will return as scheduled 07/10/2017. She understands to contact the office in the interim with any problems.    Ned Card ANP/GNP-BC   07/07/2017  11:41 AM

## 2017-07-07 NOTE — Telephone Encounter (Signed)
IVF infusing per lisa thomas order. Pt sitting up on exam room table with husband at bedside. Warm blanket and pillow provided for patient. Will continue to monitor this patient.

## 2017-07-07 NOTE — Telephone Encounter (Signed)
Called patient and gave them appointment time for today. Per RN Tonya  Sch. Message in basket (H.P.) on 10/18

## 2017-07-07 NOTE — Telephone Encounter (Signed)
No 10/19 los.  

## 2017-07-08 LAB — PTH, INTACT AND CALCIUM
CALCIUM: 12.4 mg/dL — AB (ref 8.7–10.3)
PTH: 10 pg/mL — AB (ref 15–65)

## 2017-07-10 ENCOUNTER — Ambulatory Visit (HOSPITAL_BASED_OUTPATIENT_CLINIC_OR_DEPARTMENT_OTHER): Payer: Medicare Other

## 2017-07-10 ENCOUNTER — Other Ambulatory Visit (HOSPITAL_BASED_OUTPATIENT_CLINIC_OR_DEPARTMENT_OTHER): Payer: Medicare Other

## 2017-07-10 ENCOUNTER — Ambulatory Visit (HOSPITAL_BASED_OUTPATIENT_CLINIC_OR_DEPARTMENT_OTHER): Payer: Medicare Other | Admitting: Oncology

## 2017-07-10 ENCOUNTER — Other Ambulatory Visit: Payer: Self-pay | Admitting: *Deleted

## 2017-07-10 ENCOUNTER — Telehealth: Payer: Self-pay

## 2017-07-10 ENCOUNTER — Ambulatory Visit: Payer: Medicare Other

## 2017-07-10 VITALS — BP 135/69 | HR 90 | Temp 98.5°F | Resp 20 | Ht 65.0 in | Wt 169.2 lb

## 2017-07-10 DIAGNOSIS — Z5111 Encounter for antineoplastic chemotherapy: Secondary | ICD-10-CM | POA: Diagnosis not present

## 2017-07-10 DIAGNOSIS — Z853 Personal history of malignant neoplasm of breast: Secondary | ICD-10-CM

## 2017-07-10 DIAGNOSIS — C50919 Malignant neoplasm of unspecified site of unspecified female breast: Secondary | ICD-10-CM

## 2017-07-10 DIAGNOSIS — C7951 Secondary malignant neoplasm of bone: Secondary | ICD-10-CM

## 2017-07-10 LAB — CBC WITH DIFFERENTIAL/PLATELET
BASO%: 0.5 % (ref 0.0–2.0)
Basophils Absolute: 0 10*3/uL (ref 0.0–0.1)
EOS%: 0.8 % (ref 0.0–7.0)
Eosinophils Absolute: 0 10*3/uL (ref 0.0–0.5)
HCT: 26.9 % — ABNORMAL LOW (ref 34.8–46.6)
HGB: 9.2 g/dL — ABNORMAL LOW (ref 11.6–15.9)
LYMPH%: 19 % (ref 14.0–49.7)
MCH: 34.1 pg — ABNORMAL HIGH (ref 25.1–34.0)
MCHC: 34.2 g/dL (ref 31.5–36.0)
MCV: 99.6 fL (ref 79.5–101.0)
MONO#: 0.4 10*3/uL (ref 0.1–0.9)
MONO%: 8.9 % (ref 0.0–14.0)
NEUT#: 2.8 10*3/uL (ref 1.5–6.5)
NEUT%: 70.8 % (ref 38.4–76.8)
Platelets: 258 10*3/uL (ref 145–400)
RBC: 2.7 10*6/uL — AB (ref 3.70–5.45)
RDW: 15.4 % — ABNORMAL HIGH (ref 11.2–14.5)
WBC: 3.9 10*3/uL (ref 3.9–10.3)
lymph#: 0.8 10*3/uL — ABNORMAL LOW (ref 0.9–3.3)
nRBC: 3 % — ABNORMAL HIGH (ref 0–0)

## 2017-07-10 LAB — COMPREHENSIVE METABOLIC PANEL
ALK PHOS: 139 U/L (ref 40–150)
ALT: 40 U/L (ref 0–55)
ANION GAP: 9 meq/L (ref 3–11)
AST: 56 U/L — AB (ref 5–34)
Albumin: 3.7 g/dL (ref 3.5–5.0)
BILIRUBIN TOTAL: 0.54 mg/dL (ref 0.20–1.20)
BUN: 7.1 mg/dL (ref 7.0–26.0)
CHLORIDE: 108 meq/L (ref 98–109)
CO2: 21 meq/L — AB (ref 22–29)
CREATININE: 0.7 mg/dL (ref 0.6–1.1)
Calcium: 12.3 mg/dL — ABNORMAL HIGH (ref 8.4–10.4)
EGFR: >60 mL/min/{1.73_m2} (ref >60)
Glucose: 92 mg/dl (ref 70–140)
Potassium: 3.7 mEq/L (ref 3.5–5.1)
Sodium: 138 mEq/L (ref 136–145)
Total Protein: 7.3 g/dL (ref 6.4–8.3)

## 2017-07-10 LAB — TECHNOLOGIST REVIEW

## 2017-07-10 MED ORDER — ONDANSETRON HCL 8 MG PO TABS
8.0000 mg | ORAL_TABLET | Freq: Once | ORAL | Status: AC
  Administered 2017-07-10: 8 mg via ORAL

## 2017-07-10 MED ORDER — DEXAMETHASONE SODIUM PHOSPHATE 10 MG/ML IJ SOLN
10.0000 mg | Freq: Once | INTRAMUSCULAR | Status: AC
Start: 2017-07-10 — End: 2017-07-10
  Administered 2017-07-10: 10 mg via INTRAVENOUS

## 2017-07-10 MED ORDER — PACLITAXEL CHEMO INJECTION 300 MG/50ML
80.0000 mg/m2 | Freq: Once | INTRAVENOUS | Status: AC
  Administered 2017-07-10: 150 mg via INTRAVENOUS
  Filled 2017-07-10: qty 25

## 2017-07-10 MED ORDER — SODIUM CHLORIDE 0.9 % IV SOLN
Freq: Once | INTRAVENOUS | Status: AC
  Administered 2017-07-10: 11:00:00 via INTRAVENOUS

## 2017-07-10 MED ORDER — FAMOTIDINE IN NACL 20-0.9 MG/50ML-% IV SOLN
20.0000 mg | Freq: Once | INTRAVENOUS | Status: AC
  Administered 2017-07-10: 20 mg via INTRAVENOUS

## 2017-07-10 MED ORDER — DIPHENHYDRAMINE HCL 50 MG/ML IJ SOLN
25.0000 mg | Freq: Once | INTRAMUSCULAR | Status: AC
Start: 2017-07-10 — End: 2017-07-10
  Administered 2017-07-10: 25 mg via INTRAVENOUS

## 2017-07-10 MED ORDER — SODIUM CHLORIDE 0.9% FLUSH
10.0000 mL | INTRAVENOUS | Status: DC | PRN
  Administered 2017-07-10: 10 mL
  Filled 2017-07-10: qty 10

## 2017-07-10 MED ORDER — DIPHENHYDRAMINE HCL 50 MG/ML IJ SOLN
INTRAMUSCULAR | Status: AC
  Filled 2017-07-10: qty 1

## 2017-07-10 MED ORDER — FAMOTIDINE IN NACL 20-0.9 MG/50ML-% IV SOLN
INTRAVENOUS | Status: AC
  Filled 2017-07-10: qty 50

## 2017-07-10 MED ORDER — HEPARIN SOD (PORK) LOCK FLUSH 100 UNIT/ML IV SOLN
500.0000 [IU] | Freq: Once | INTRAVENOUS | Status: AC | PRN
  Administered 2017-07-10: 500 [IU]
  Filled 2017-07-10: qty 5

## 2017-07-10 MED ORDER — SODIUM CHLORIDE 0.9 % IV SOLN
186.8000 mg | Freq: Once | INTRAVENOUS | Status: AC
  Administered 2017-07-10: 190 mg via INTRAVENOUS
  Filled 2017-07-10: qty 19

## 2017-07-10 MED ORDER — POTASSIUM CHLORIDE CRYS ER 20 MEQ PO TBCR: 20.0000 meq | EXTENDED_RELEASE_TABLET | Freq: Every day | ORAL | 1 refills | Status: DC

## 2017-07-10 MED ORDER — DEXAMETHASONE SODIUM PHOSPHATE 10 MG/ML IJ SOLN
INTRAMUSCULAR | Status: AC
Start: 2017-07-10 — End: 2017-07-10
  Filled 2017-07-10: qty 1

## 2017-07-10 MED ORDER — ONDANSETRON HCL 8 MG PO TABS
ORAL_TABLET | ORAL | Status: AC
  Filled 2017-07-10: qty 1

## 2017-07-10 NOTE — Patient Instructions (Signed)

## 2017-07-10 NOTE — Progress Notes (Signed)
[facility] OFFICE PROGRESS NOTE   Diagnosis: Breast cancer  INTERVAL HISTORY:   Stacy Horn returns as scheduled. She Port-A-Cath placement 07/07/2017. She also received intravenous fluids and xgeva on 07/07/2017. She reports adequate pain relief with hydrocodone and oxycodone. She developed a tape reaction at the Port-A-Cath site. Nausea has improved.  Objective:  Vital signs in last 24 hours:  Blood pressure 135/69, pulse 90, temperature 98.5 F (36.9 C), temperature source Oral, resp. rate 20, height _0  (1.651 m), weight 169 lb 3.2 oz (76.7 kg), last menstrual period 10/08/2014, SpO2 100 %.    HEENT: No thrush or ulcers Resp: Lungs clear bilaterally Cardio: Regular rate and rhythm Vascular: No leg edema  Skin: Erythematous rash and a tape distribution surrounding Port-A-Cath     Lab Results:  Lab Results  Component Value Date   WBC 3.9 07/10/2017   HGB 9.2 (L) 07/10/2017   HCT 26.9 (L) 07/10/2017   MCV 99.6 07/10/2017   PLT 258 07/10/2017   NEUTROABS 2.8 07/10/2017    CMP     Component Value Date/Time   NA 138 07/10/2017 0847   K 3.7 07/10/2017 0847   CL 107 12/20/2012 0927   CO2 21 (L) 07/10/2017 0847   GLUCOSE 92 07/10/2017 0847   GLUCOSE 89 12/20/2012 0927   BUN 7.1 07/10/2017 0847   CREATININE 0.7 07/10/2017 0847   CALCIUM 12.3 (H) 07/10/2017 0847   PROT 7.3 07/10/2017 0847   ALBUMIN 3.7 07/10/2017 0847   AST 56 (H) 07/10/2017 0847   ALT 40 07/10/2017 0847   ALKPHOS 139 07/10/2017 0847   BILITOT 0.54 07/10/2017 0847    Imaging:  Ir US Guide Vasc Access Right  Result Date: 07/06/2017 CLINICAL DATA:  Metastatic breast cancer, access for chemotherapy EXAM: LEFT INTERNAL JUGULAR SINGLE LUMEN POWER PORT CATHETER INSERTION Date:  10/18/201810/18/2018 3:02 pm Radiologist:  M. Daryll Brod, MD Guidance:  ULTRASOUND AND FLUOROSCOPIC MEDICATIONS: Clindamycin 600 mg IV; The antibiotic was administered within an appropriate time interval  prior to skin puncture. ANESTHESIA/SEDATION: Versed 2.0 mg IV; Fentanyl 100 mcg IV; Moderate Sedation Time:  100 MINUTES The patient was continuously monitored during the procedure by the interventional radiology nurse under my direct supervision. FLUOROSCOPY TIME:  1 minutes, 6 seconds (6 mGy) COMPLICATIONS: None immediate. CONTRAST:  None. PROCEDURE: Informed consent was obtained from the patient following explanation of the procedure, risks, benefits and alternatives. The patient understands, agrees and consents for the procedure. All questions were addressed. A time out was performed. Maximal barrier sterile technique utilized including caps, mask, sterile gowns, sterile gloves, large sterile drape, hand hygiene, and 2% chlorhexidine scrub. Under sterile conditions and local anesthesia, left internal jugular micropuncture venous access was performed. Access was performed with ultrasound. Images were obtained for documentation. A guide wire was inserted followed by a transitional dilator. This allowed insertion of a guide wire and catheter into the IVC. Measurements were obtained from the SVC / RA junction back to the left IJ venotomy site. In the left infraclavicular chest, a subcutaneous pocket was created over the second anterior rib. This was done under sterile conditions and local anesthesia. 1% lidocaine with epinephrine was utilized for this. A 2.5 cm incision was made in the skin. Blunt dissection was performed to create a subcutaneous pocket over the right pectoralis major muscle. The pocket was flushed with saline vigorously. There was adequate hemostasis. The port catheter was assembled and checked for leakage. The port catheter was secured in the pocket with  two retention sutures. The tubing was tunneled subcutaneously to the left venotomy site and inserted into the SVC/RA junction through a valved peel-away sheath. Position was confirmed with fluoroscopy. Images were obtained for documentation. The  patient tolerated the procedure well. No immediate complications. Incisions were closed in a two layer fashion with 4 - 0 Vicryl suture. Dermabond was applied to the skin. The port catheter was accessed, blood was aspirated followed by saline and heparin flushes. Needle was removed. A dry sterile dressing was applied. IMPRESSION: Ultrasound and fluoroscopically guided left internal jugular single lumen power port catheter insertion. Tip in the SVC/RA junction. Catheter ready for use. Electronically Signed   By: Jerilynn Mages.  Shick M.D.   On: 07/06/2017 15:06   Ir Fluoro Guide Port Insertion Right  Result Date: 07/06/2017 CLINICAL DATA:  Metastatic breast cancer, access for chemotherapy EXAM: LEFT INTERNAL JUGULAR SINGLE LUMEN POWER PORT CATHETER INSERTION Date:  10/18/201810/18/2018 3:02 pm Radiologist:  M. Daryll Brod, MD Guidance:  ULTRASOUND AND FLUOROSCOPIC MEDICATIONS: Clindamycin 600 mg IV; The antibiotic was administered within an appropriate time interval prior to skin puncture. ANESTHESIA/SEDATION: Versed 2.0 mg IV; Fentanyl 100 mcg IV; Moderate Sedation Time:  47 MINUTES The patient was continuously monitored during the procedure by the interventional radiology nurse under my direct supervision. FLUOROSCOPY TIME:  1 minutes, 6 seconds (6 mGy) COMPLICATIONS: None immediate. CONTRAST:  None. PROCEDURE: Informed consent was obtained from the patient following explanation of the procedure, risks, benefits and alternatives. The patient understands, agrees and consents for the procedure. All questions were addressed. A time out was performed. Maximal barrier sterile technique utilized including caps, mask, sterile gowns, sterile gloves, large sterile drape, hand hygiene, and 2% chlorhexidine scrub. Under sterile conditions and local anesthesia, left internal jugular micropuncture venous access was performed. Access was performed with ultrasound. Images were obtained for documentation. A guide wire was inserted  followed by a transitional dilator. This allowed insertion of a guide wire and catheter into the IVC. Measurements were obtained from the SVC / RA junction back to the left IJ venotomy site. In the left infraclavicular chest, a subcutaneous pocket was created over the second anterior rib. This was done under sterile conditions and local anesthesia. 1% lidocaine with epinephrine was utilized for this. A 2.5 cm incision was made in the skin. Blunt dissection was performed to create a subcutaneous pocket over the right pectoralis major muscle. The pocket was flushed with saline vigorously. There was adequate hemostasis. The port catheter was assembled and checked for leakage. The port catheter was secured in the pocket with two retention sutures. The tubing was tunneled subcutaneously to the left venotomy site and inserted into the SVC/RA junction through a valved peel-away sheath. Position was confirmed with fluoroscopy. Images were obtained for documentation. The patient tolerated the procedure well. No immediate complications. Incisions were closed in a two layer fashion with 4 - 0 Vicryl suture. Dermabond was applied to the skin. The port catheter was accessed, blood was aspirated followed by saline and heparin flushes. Needle was removed. A dry sterile dressing was applied. IMPRESSION: Ultrasound and fluoroscopically guided left internal jugular single lumen power port catheter insertion. Tip in the SVC/RA junction. Catheter ready for use. Electronically Signed   By: Jerilynn Mages.  Shick M.D.   On: 07/06/2017 15:06    Medications: I have reviewed the patient's current medications.  1. Multicentric invasive lobular carcinoma of the right breast diagnosed in December of 1999, a right mastectomy followed by adjuvant [facility] chemotherapy, 5 years of  tamoxifen, and 5 years of Femara. The Femara was completed in June of 2010. The right breast mass excision in January 2000 was ER positive, PR positive, and HER-2 negative  2. Pain  at the left groin area with a CT of the pelvis on 02/07/12 confirming a destructive lytic lesion at the left pubic symphysis, status post a CT-guided biopsy on 02/16/12 confirming metastatic carcinoma, focally ER positive .  -PET scan 03/07/2012 confirmed multiple hypermetabolic bone metastases with no other evidence of metastatic disease  -Status post palliative radiation to the pelvis beginning on 03/12/2012  -Initiation of Arimidex on 03/27/2012 -initiation of every three-month xgeva on 04/04/2012  -PET scan September 14 [id number] new hypermetabolic bone lesions  -Initiation of Faslodex 09/27/2012  -Restaging PET scan 04/08/2013 with evidence of disease progression in the bones  -Initiation of Aromasin/afinitor 05/29/2013  -Iliac biopsy at [facility] 05/22/2013 confirming HER-2 negative metastatic breast  -PET scan 11/18/2013 with a decrease in hypermetabolic activity  -PET scan 10/08/2014 with progressive diffuse hypermetabolic bone metastases -Initiation of Palbociclib 10/20/2014; Faslodex 10/23/2014. -Palbociclib placed on hold beginning 11/07/2014 due to neutropenia. -Palbociclib resumed 11/27/2014 on an every other day schedule -Palbociclib resumed 01/04/2015 at a reduced dose of 100 mg daily for 21 days. -Palbociclib beginning 02/12/2015 at a dose of 100 mg daily for 14 days on/14 days off. -PET scan 05/18/2015 with diffuse hypermetabolic bone lesions, increased metabolic activity compared to the PET scan from January 2016 -Palbociclib resumed 06/07/2015, Faslodex continued 06/12/2015 -PET scan 09/22/2015 with diffuse hypermetabolic bone lesions, increased metabolic activity compared to the PET scan 05/18/2015 -Continuation of palbociclib and Faslodex -palbociclib and Faslodex discontinued April 2017 secondary to a C7 spinous process fracture and hypercalcemia -PET scan [id number]/[id number] hypermetabolic bone metastases, with increased metabolic activity interpreted as consistent  with disease progression - Xeloda (7 days on/7 days off) started 02/28/2016 -PET scan 08/31/2016-marked improvement in hypermetabolic bone activity, no extra osseous disease -Xeloda continued -PET scan 06/05/2017-progression of bone metastase including a T3 lesion with paraspinal soft tissue, destructive lesion at T8 and progressive lesions at the shoulders, ribs, sternum, and pelvis -Xeloda discontinued 06/08/2017 -Initiation of weekly Taxol 06/26/2017, carboplatin added with week 3 07/10/2017 3. BRCA1 variant felt to be a polymorphism  4. Pain/tenderness at the left low anterolateral chest wall-she completed palliative radiation on 05/28/2012 with improvement in the pain. She was also treated with radiation to the thoracic spine.  5. Anemia/leukopenia-likely secondary to breast cancer involving the bone marrow and radiation /afinitor -the anemia is improved  6. History of mildly elevated liver enzymes-? Related to afinitor,? Secondary to metastatic breast cancer 7. Neutropenia secondary to Palbociclib. Improved. 8. Pain at the left shoulder and mid back-a plain x-ray of the left shoulder 06/12/2015 revealed a sclerotic density at the left scapula adjacent to the glenoid, there is metastatic disease involving the humeral head and left scapula on the PET scan 05/18/2015. She completed a course of radiation to the left shoulder on 08/05/2015. 9. Low neck/upper back pain 12/24/2015 with MRI showing a mildly displaced acute-appearing C7 spinous process fracture; widespread osseous metastatic disease; no evidence of epidural tumor or spinal cord compression. - Completed palliative radiation to the cervical spine 02/17/2016 10. Dysphagia/odynophagia secondary to radiation-resolved 11. Xeloda-induced skin rash 12. Hypercalcemia secondary to #1. Status post Zometa 06/08/2017, 06/19/2017, and 07/03/2017.hypercalcemia. Pamidronate 06/26/2017.Calcitonin 07/03/2017, 07/04/2017, 07/06/2017; improved  07/07/2017; IV fluids and Xgeva 07/07/2017     Disposition:  Stacy Horn appears stable. She has persistent hypercalcemia. She will continue weekly xgeva beginning  07/14/2017. She will return for an office visit 07/20/2017.  We added carboplatin to the chemotherapy regimen today and an attempt to better control the hypercalcemia and her pain. We reviewed the potential toxicities associated with carboplatin and she agrees to proceed. 25 minutes were spent with the patient today. The majority of the time was used for counseling and coordination of care.  Donneta Romberg, MD  07/10/2017  11:32 AM

## 2017-07-10 NOTE — Telephone Encounter (Signed)
Printed avs and calender for upcoming appointment  per 10/22 los.

## 2017-07-10 NOTE — Patient Instructions (Signed)
[facility] Discharge Instructions for Patients Receiving Chemotherapy  Today you received the following chemotherapy agents Taxol and Carboplatin.   To help prevent nausea and vomiting after your treatment, we encourage you to take your nausea medication as prescribed.    If you develop nausea and vomiting that is not controlled by your nausea medication, call the clinic.   BELOW ARE SYMPTOMS THAT SHOULD BE REPORTED IMMEDIATELY:  *FEVER GREATER THAN 100.5 F  *CHILLS WITH OR WITHOUT FEVER  NAUSEA AND VOMITING THAT IS NOT CONTROLLED WITH YOUR NAUSEA MEDICATION  *UNUSUAL SHORTNESS OF BREATH  *UNUSUAL BRUISING OR BLEEDING  TENDERNESS IN MOUTH AND THROAT WITH OR WITHOUT PRESENCE OF ULCERS  *URINARY PROBLEMS  *BOWEL PROBLEMS  UNUSUAL RASH Items with * indicate a potential emergency and should be followed up as soon as possible.  Feel free to call the clinic should you have any questions or concerns. The clinic phone number is [phone].  Please show the CHEMO ALERT CARD at check-in to the Emergency Department and triage nurse.   

## 2017-07-11 ENCOUNTER — Telehealth: Payer: Self-pay | Admitting: *Deleted

## 2017-07-11 NOTE — Telephone Encounter (Signed)
Spoke with patient about 1st chemo treatment.  Stated that she was fine , no side effects yet, and that she was drinking plenty of liquids. No s/s of nv as yet and no diarrhea.  She was reminded to call us if needed.

## 2017-07-13 ENCOUNTER — Other Ambulatory Visit: Payer: Self-pay | Admitting: Oncology

## 2017-07-14 ENCOUNTER — Other Ambulatory Visit (HOSPITAL_BASED_OUTPATIENT_CLINIC_OR_DEPARTMENT_OTHER): Payer: Medicare Other

## 2017-07-14 ENCOUNTER — Ambulatory Visit (HOSPITAL_BASED_OUTPATIENT_CLINIC_OR_DEPARTMENT_OTHER): Payer: Medicare Other

## 2017-07-14 DIAGNOSIS — C7951 Secondary malignant neoplasm of bone: Secondary | ICD-10-CM

## 2017-07-14 DIAGNOSIS — C50919 Malignant neoplasm of unspecified site of unspecified female breast: Secondary | ICD-10-CM

## 2017-07-14 DIAGNOSIS — Z853 Personal history of malignant neoplasm of breast: Secondary | ICD-10-CM

## 2017-07-14 DIAGNOSIS — Z9889 Other specified postprocedural states: Secondary | ICD-10-CM

## 2017-07-14 LAB — COMPREHENSIVE METABOLIC PANEL
ALT: 56 U/L — AB (ref 0–55)
ANION GAP: 10 meq/L (ref 3–11)
AST: 62 U/L — ABNORMAL HIGH (ref 5–34)
Albumin: 4.1 g/dL (ref 3.5–5.0)
Alkaline Phosphatase: 148 U/L (ref 40–150)
BUN: 12.1 mg/dL (ref 7.0–26.0)
CALCIUM: 12.9 mg/dL — AB (ref 8.4–10.4)
CHLORIDE: 105 meq/L (ref 98–109)
CO2: 21 mEq/L — ABNORMAL LOW (ref 22–29)
CREATININE: 0.9 mg/dL (ref 0.6–1.1)
Glucose: 111 mg/dl (ref 70–140)
Potassium: 3.8 mEq/L (ref 3.5–5.1)
Sodium: 136 mEq/L (ref 136–145)
Total Bilirubin: 0.79 mg/dL (ref 0.20–1.20)
Total Protein: 7.9 g/dL (ref 6.4–8.3)

## 2017-07-14 MED ORDER — DENOSUMAB 120 MG/1.7ML ~~LOC~~ SOLN
120.0000 mg | Freq: Once | SUBCUTANEOUS | Status: AC
  Administered 2017-07-14: 120 mg via SUBCUTANEOUS
  Filled 2017-07-14: qty 1.7

## 2017-07-14 NOTE — Patient Instructions (Signed)

## 2017-07-17 ENCOUNTER — Other Ambulatory Visit: Payer: Self-pay | Admitting: *Deleted

## 2017-07-17 ENCOUNTER — Telehealth: Payer: Self-pay

## 2017-07-17 DIAGNOSIS — C50919 Malignant neoplasm of unspecified site of unspecified female breast: Secondary | ICD-10-CM

## 2017-07-17 DIAGNOSIS — C7951 Secondary malignant neoplasm of bone: Principal | ICD-10-CM

## 2017-07-17 MED ORDER — OXYCODONE-ACETAMINOPHEN 5-325 MG PO TABS: 1.0000 | ORAL_TABLET | Freq: Four times a day (QID) | ORAL | 0 refills | Status: DC | PRN

## 2017-07-17 NOTE — Telephone Encounter (Signed)
Pt is calling for an oxycodone refill. She would like to pick it up this afternoon.

## 2017-07-20 ENCOUNTER — Other Ambulatory Visit (HOSPITAL_BASED_OUTPATIENT_CLINIC_OR_DEPARTMENT_OTHER): Payer: Medicare Other

## 2017-07-20 ENCOUNTER — Ambulatory Visit: Payer: Medicare Other

## 2017-07-20 ENCOUNTER — Telehealth: Payer: Self-pay | Admitting: Oncology

## 2017-07-20 ENCOUNTER — Ambulatory Visit (HOSPITAL_BASED_OUTPATIENT_CLINIC_OR_DEPARTMENT_OTHER): Payer: Medicare Other | Admitting: Nurse Practitioner

## 2017-07-20 VITALS — BP 123/65 | HR 82 | Temp 98.8°F | Resp 18 | Ht 65.0 in | Wt 167.2 lb

## 2017-07-20 DIAGNOSIS — C7951 Secondary malignant neoplasm of bone: Secondary | ICD-10-CM | POA: Diagnosis not present

## 2017-07-20 DIAGNOSIS — Z853 Personal history of malignant neoplasm of breast: Secondary | ICD-10-CM | POA: Diagnosis not present

## 2017-07-20 DIAGNOSIS — C50919 Malignant neoplasm of unspecified site of unspecified female breast: Secondary | ICD-10-CM

## 2017-07-20 LAB — COMPREHENSIVE METABOLIC PANEL
ALBUMIN: 4 g/dL (ref 3.5–5.0)
ALK PHOS: 158 U/L — AB (ref 40–150)
ALT: 52 U/L (ref 0–55)
AST: 60 U/L — AB (ref 5–34)
Anion Gap: 5 mEq/L (ref 3–11)
BILIRUBIN TOTAL: 0.61 mg/dL (ref 0.20–1.20)
BUN: 11 mg/dL (ref 7.0–26.0)
CALCIUM: 12.8 mg/dL — AB (ref 8.4–10.4)
CO2: 25 mEq/L (ref 22–29)
CREATININE: 0.8 mg/dL (ref 0.6–1.1)
Chloride: 108 mEq/L (ref 98–109)
EGFR: >60 mL/min/{1.73_m2} (ref >60)
GLUCOSE: 92 mg/dL (ref 70–140)
POTASSIUM: 4.4 meq/L (ref 3.5–5.1)
Sodium: 137 mEq/L (ref 136–145)
TOTAL PROTEIN: 7.5 g/dL (ref 6.4–8.3)

## 2017-07-20 LAB — CBC WITH DIFFERENTIAL/PLATELET
BASO%: 0.5 % (ref 0.0–2.0)
BASOS ABS: 0 10*3/uL (ref 0.0–0.1)
EOS%: 0 % (ref 0.0–7.0)
Eosinophils Absolute: 0 10*3/uL (ref 0.0–0.5)
HEMATOCRIT: 29.2 % — AB (ref 34.8–46.6)
HEMOGLOBIN: 9.7 g/dL — AB (ref 11.6–15.9)
LYMPH#: 1.3 10*3/uL (ref 0.9–3.3)
LYMPH%: 33.1 % (ref 14.0–49.7)
MCH: 33.6 pg (ref 25.1–34.0)
MCHC: 33.2 g/dL (ref 31.5–36.0)
MCV: 101 fL (ref 79.5–101.0)
MONO#: 0.6 10*3/uL (ref 0.1–0.9)
MONO%: 14.6 % — ABNORMAL HIGH (ref 0.0–14.0)
NEUT#: 2 10*3/uL (ref 1.5–6.5)
NEUT%: 51.8 % (ref 38.4–76.8)
NRBC: 2 % — AB (ref 0–0)
Platelets: 215 10*3/uL (ref 145–400)
RBC: 2.89 10*6/uL — ABNORMAL LOW (ref 3.70–5.45)
RDW: 16.2 % — AB (ref 11.2–14.5)
WBC: 3.8 10*3/uL — ABNORMAL LOW (ref 3.9–10.3)

## 2017-07-20 LAB — TECHNOLOGIST REVIEW

## 2017-07-20 MED ORDER — HYDROCODONE-ACETAMINOPHEN 7.5-325 MG PO TABS: 1.0000 | ORAL_TABLET | ORAL | 0 refills | Status: DC | PRN

## 2017-07-20 NOTE — Telephone Encounter (Signed)
Gave avs and calendar for november °

## 2017-07-20 NOTE — Progress Notes (Addendum)
[facility] OFFICE PROGRESS NOTE   Diagnosis:  Breast cancer  INTERVAL HISTORY:   Stacy Horn returns as scheduled. She completed a treatment with Taxol/carboplatin 07/10/2017. She denies nausea/vomiting. No mouth sores. No diarrhea. No change in baseline neuropathy symptoms. She notes increased pain at the left upper arm. Other areas of pain are stable. She thinks hydrocodone is more effective than Percocet.  Objective:  Vital signs in last 24 hours:  Blood pressure 123/65, pulse 82, temperature 98.8 F (37.1 C), temperature source Oral, resp. rate 18, height '5\' 5"'  (1.651 m), weight 167 lb 3.2 oz (75.8 kg), last menstrual period 10/08/2014, SpO2 100 %.    HEENT: no thrush or ulcers. Resp: lungs clear bilaterally. Cardio: regular rate and rhythm. GI: abdomen soft and nontender. No hepatomegaly. Vascular: no leg edema.completed Musculoskeletal: tender inferior left scapula; tender left mid to upper arm.   Port-A-Cath without erythema.  Lab Results:  Lab Results  Component Value Date   WBC 3.8 (L) 07/20/2017   HGB 9.7 (L) 07/20/2017   HCT 29.2 (L) 07/20/2017   MCV 101.0 07/20/2017   PLT 215 07/20/2017   NEUTROABS 2.0 07/20/2017    Imaging:  No results found.  Medications: I have reviewed the patient's current medications.  Assessment/Plan: 1. Multicentric invasive lobular carcinoma of the right breast diagnosed in December of 1999, a right mastectomy followed by adjuvant [facility] chemotherapy, 5 years of tamoxifen, and 5 years of Femara. The Femara was completed in June of 2010. The right breast mass excision in January 2000 was ER positive, PR positive, and HER-2 negative  2. Pain at the left groin area with a CT of the pelvis on 02/07/12 confirming a destructive lytic lesion at the left pubic symphysis, status post a CT-guided biopsy on 02/16/12 confirming metastatic carcinoma, focally ER positive .  -PET scan 03/07/2012 confirmed multiple hypermetabolic bone  metastases with no other evidence of metastatic disease  -Status post palliative radiation to the pelvis beginning on 03/12/2012  -Initiation of Arimidex on 03/27/2012 -initiation of every three-month xgeva on 04/04/2012  -PET scan September 14 [id number] new hypermetabolic bone lesions  -Initiation of Faslodex 09/27/2012  -Restaging PET scan 04/08/2013 with evidence of disease progression in the bones  -Initiation of Aromasin/afinitor 05/29/2013  -Iliac biopsy at [facility] 05/22/2013 confirming HER-2 negative metastatic breast  -PET scan 11/18/2013 with a decrease in hypermetabolic activity  -PET scan 10/08/2014 with progressive diffuse hypermetabolic bone metastases -Initiation of Palbociclib 10/20/2014; Faslodex 10/23/2014. -Palbociclib placed on hold beginning 11/07/2014 due to neutropenia. -Palbociclib resumed 11/27/2014 on an every other day schedule -Palbociclib resumed 01/04/2015 at a reduced dose of 100 mg daily for 21 days. -Palbociclib beginning 02/12/2015 at a dose of 100 mg daily for 14 days on/14 days off. -PET scan 05/18/2015 with diffuse hypermetabolic bone lesions, increased metabolic activity compared to the PET scan from January 2016 -Palbociclib resumed 06/07/2015, Faslodex continued 06/12/2015 -PET scan 09/22/2015 with diffuse hypermetabolic bone lesions, increased metabolic activity compared to the PET scan 05/18/2015 -Continuation of palbociclib and Faslodex -palbociclib and Faslodex discontinued April 2017 secondary to a C7 spinous process fracture and hypercalcemia -PET scan 14/48/[id number] hypermetabolic bone metastases, with increased metabolic activity interpreted as consistent with disease progression - Xeloda (7 days on/7 days off) started 02/28/2016 -PET scan 08/31/2016-marked improvement in hypermetabolic bone activity, no extra osseous disease -Xeloda continued -PET scan 06/05/2017-progression of bone metastase including a T3 lesion with paraspinal  soft tissue, destructive lesion at T8 and progressive lesions at the shoulders, ribs, sternum,  and pelvis -Xeloda discontinued 06/08/2017 -Initiation of weekly Taxol 06/26/2017, carboplatin added with week 3 07/10/2017 3. BRCA1 variant felt to be a polymorphism  4. Pain/tenderness at the left low anterolateral chest wall-she completed palliative radiation on 05/28/2012 with improvement in the pain. She was also treated with radiation to the thoracic spine.  5. Anemia/leukopenia-likely secondary to breast cancer involving the bone marrow and radiation /afinitor -the anemia is improved  6. History of mildly elevated liver enzymes-? Related to afinitor,? Secondary to metastatic breast cancer 7. Neutropenia secondary to Palbociclib. Improved. 8. Pain at the left shoulder and mid back-a plain x-ray of the left shoulder 06/12/2015 revealed a sclerotic density at the left scapula adjacent to the glenoid, there is metastatic disease involving the humeral head and left scapula on the PET scan 05/18/2015. She completed a course of radiation to the left shoulder on 08/05/2015. 9. Low neck/upper back pain 12/24/2015 with MRI showing a mildly displaced acute-appearing C7 spinous process fracture; widespread osseous metastatic disease; no evidence of epidural tumor or spinal cord compression. - Completed palliative radiation to the cervical spine 02/17/2016 10. Dysphagia/odynophagia secondary to radiation-resolved 11. Xeloda-induced skin rash 12. Hypercalcemia secondary to #1. Status post Zometa 06/08/2017, 06/19/2017, and 07/03/2017.hypercalcemia. Pamidronate 06/26/2017.Calcitonin 07/03/2017, 07/04/2017, 07/06/2017; improved 07/07/2017; IV fluids and Xgeva 07/07/2017; Delton See 07/14/2017; calcium stable 07/20/2017.   Disposition: Stacy Horn appears stable. She has completed 3 weekly treatments with Taxol; Carboplatin was added with week 3. She will return for the next cycle of Taxol/carboplatin 07/24/2017  (weekly 3).  She continues to have hypercalcemia though the calcium level is stable as compared to 07/14/2017. The plan is to continue Xgeva weekly 4, third injection scheduled 07/24/2017. Calcium will be repeated at that time.  She is having increased pain at the left arm. She will have a plain x-ray left humerus 07/24/2017. She finds that hydrocodone is more effective than Percocet. She was provided with a new hydrocodone prescription today.  We will see her in follow-up 07/31/2017. She will contact the office in the interim with any problems.   Patient seen with Dr. Benay Spice. 25 minutes were spent face-to-face at today's visit with the majority of that time involved in counseling/coordination of care.   Stacy Horn ANP/GNP-BC   07/20/2017  3:29 PM  This was a shared visit with Stacy Horn.  Stacy Horn was interviewed and examined.  We will obtain a plain x-ray of the left arm when she returns for chemotherapy on 07/24/2017.  The elevated calcium level has stabilized over the past week.  She will receive another dose of Xgeva on 07/24/2017.  Stacy Manson, MD

## 2017-07-23 ENCOUNTER — Other Ambulatory Visit: Payer: Self-pay | Admitting: Oncology

## 2017-07-24 ENCOUNTER — Other Ambulatory Visit (HOSPITAL_BASED_OUTPATIENT_CLINIC_OR_DEPARTMENT_OTHER): Payer: Medicare Other

## 2017-07-24 ENCOUNTER — Ambulatory Visit (HOSPITAL_BASED_OUTPATIENT_CLINIC_OR_DEPARTMENT_OTHER): Payer: Medicare Other

## 2017-07-24 ENCOUNTER — Ambulatory Visit: Payer: Medicare Other

## 2017-07-24 ENCOUNTER — Ambulatory Visit: Payer: Medicare Other | Admitting: Nurse Practitioner

## 2017-07-24 ENCOUNTER — Ambulatory Visit (HOSPITAL_COMMUNITY)
Admission: RE | Admit: 2017-07-24 | Discharge: 2017-07-24 | Disposition: A | Discharge status: Home / Self Care | Payer: Medicare Other | Source: Ambulatory Visit | Attending: Nurse Practitioner | Admitting: Nurse Practitioner

## 2017-07-24 VITALS — BP 104/68 | HR 98 | Temp 98.8°F | Resp 18

## 2017-07-24 DIAGNOSIS — C7951 Secondary malignant neoplasm of bone: Secondary | ICD-10-CM | POA: Insufficient documentation

## 2017-07-24 DIAGNOSIS — Z5111 Encounter for antineoplastic chemotherapy: Secondary | ICD-10-CM | POA: Diagnosis present

## 2017-07-24 DIAGNOSIS — C50919 Malignant neoplasm of unspecified site of unspecified female breast: Secondary | ICD-10-CM | POA: Insufficient documentation

## 2017-07-24 DIAGNOSIS — Z853 Personal history of malignant neoplasm of breast: Secondary | ICD-10-CM

## 2017-07-24 DIAGNOSIS — S42212A Unspecified displaced fracture of surgical neck of left humerus, initial encounter for closed fracture: Secondary | ICD-10-CM | POA: Insufficient documentation

## 2017-07-24 DIAGNOSIS — Z95828 Presence of other vascular implants and grafts: Secondary | ICD-10-CM

## 2017-07-24 DIAGNOSIS — X58XXXA Exposure to other specified factors, initial encounter: Secondary | ICD-10-CM | POA: Diagnosis not present

## 2017-07-24 DIAGNOSIS — Z9889 Other specified postprocedural states: Secondary | ICD-10-CM

## 2017-07-24 LAB — CBC WITH DIFFERENTIAL/PLATELET
BASO%: 0.2 % (ref 0.0–2.0)
BASOS ABS: 0 10*3/uL (ref 0.0–0.1)
EOS ABS: 0 10*3/uL (ref 0.0–0.5)
EOS%: 0.5 % (ref 0.0–7.0)
HEMATOCRIT: 29.9 % — AB (ref 34.8–46.6)
HGB: 9.9 g/dL — ABNORMAL LOW (ref 11.6–15.9)
LYMPH#: 1.3 10*3/uL (ref 0.9–3.3)
LYMPH%: 28.8 % (ref 14.0–49.7)
MCH: 33.3 pg (ref 25.1–34.0)
MCHC: 33.1 g/dL (ref 31.5–36.0)
MCV: 100.7 fL (ref 79.5–101.0)
MONO#: 0.4 10*3/uL (ref 0.1–0.9)
MONO%: 9.2 % (ref 0.0–14.0)
NEUT#: 2.7 10*3/uL (ref 1.5–6.5)
NEUT%: 61.3 % (ref 38.4–76.8)
PLATELETS: 209 10*3/uL (ref 145–400)
RBC: 2.97 10*6/uL — ABNORMAL LOW (ref 3.70–5.45)
RDW: 16.1 % — ABNORMAL HIGH (ref 11.2–14.5)
WBC: 4.4 10*3/uL (ref 3.9–10.3)

## 2017-07-24 LAB — COMPREHENSIVE METABOLIC PANEL
ALT: 46 U/L (ref 0–55)
AST: 60 U/L — ABNORMAL HIGH (ref 5–34)
Albumin: 3.9 g/dL (ref 3.5–5.0)
Alkaline Phosphatase: 164 U/L — ABNORMAL HIGH (ref 40–150)
Anion Gap: 8 mEq/L (ref 3–11)
BUN: 11.9 mg/dL (ref 7.0–26.0)
CHLORIDE: 109 meq/L (ref 98–109)
CO2: 21 meq/L — AB (ref 22–29)
Calcium: 12.7 mg/dL — ABNORMAL HIGH (ref 8.4–10.4)
Creatinine: 0.8 mg/dL (ref 0.6–1.1)
GLUCOSE: 118 mg/dL (ref 70–140)
POTASSIUM: 3.8 meq/L (ref 3.5–5.1)
SODIUM: 138 meq/L (ref 136–145)
Total Bilirubin: 0.59 mg/dL (ref 0.20–1.20)
Total Protein: 7.6 g/dL (ref 6.4–8.3)

## 2017-07-24 MED ORDER — FAMOTIDINE IN NACL 20-0.9 MG/50ML-% IV SOLN
INTRAVENOUS | Status: AC
  Filled 2017-07-24: qty 50

## 2017-07-24 MED ORDER — FAMOTIDINE IN NACL 20-0.9 MG/50ML-% IV SOLN
20.0000 mg | Freq: Once | INTRAVENOUS | Status: AC
  Administered 2017-07-24: 20 mg via INTRAVENOUS

## 2017-07-24 MED ORDER — SODIUM CHLORIDE 0.9 % IV SOLN
Freq: Once | INTRAVENOUS | Status: AC
  Administered 2017-07-24: 12:00:00 via INTRAVENOUS

## 2017-07-24 MED ORDER — ONDANSETRON HCL 8 MG PO TABS
8.0000 mg | ORAL_TABLET | Freq: Once | ORAL | Status: AC
  Administered 2017-07-24: 8 mg via ORAL

## 2017-07-24 MED ORDER — SODIUM CHLORIDE 0.9 % IV SOLN
186.8000 mg | Freq: Once | INTRAVENOUS | Status: AC
  Administered 2017-07-24: 190 mg via INTRAVENOUS
  Filled 2017-07-24: qty 19

## 2017-07-24 MED ORDER — ONDANSETRON HCL 8 MG PO TABS
ORAL_TABLET | ORAL | Status: AC
  Filled 2017-07-24: qty 1

## 2017-07-24 MED ORDER — DIPHENHYDRAMINE HCL 50 MG/ML IJ SOLN
INTRAMUSCULAR | Status: AC
Start: 2017-07-24 — End: 2017-07-24
  Filled 2017-07-24: qty 1

## 2017-07-24 MED ORDER — DEXAMETHASONE SODIUM PHOSPHATE 10 MG/ML IJ SOLN
10.0000 mg | Freq: Once | INTRAMUSCULAR | Status: AC
  Administered 2017-07-24: 10 mg via INTRAVENOUS

## 2017-07-24 MED ORDER — DIPHENHYDRAMINE HCL 50 MG/ML IJ SOLN
25.0000 mg | Freq: Once | INTRAMUSCULAR | Status: AC
  Administered 2017-07-24: 25 mg via INTRAVENOUS

## 2017-07-24 MED ORDER — SODIUM CHLORIDE 0.9% FLUSH
10.0000 mL | INTRAVENOUS | Status: DC | PRN
  Administered 2017-07-24: 10 mL via INTRAVENOUS
  Filled 2017-07-24: qty 10

## 2017-07-24 MED ORDER — DEXAMETHASONE SODIUM PHOSPHATE 10 MG/ML IJ SOLN
INTRAMUSCULAR | Status: AC
  Filled 2017-07-24: qty 1

## 2017-07-24 MED ORDER — HEPARIN SOD (PORK) LOCK FLUSH 100 UNIT/ML IV SOLN
500.0000 [IU] | Freq: Once | INTRAVENOUS | Status: AC | PRN
  Administered 2017-07-24: 500 [IU]
  Filled 2017-07-24: qty 5

## 2017-07-24 MED ORDER — DENOSUMAB 120 MG/1.7ML ~~LOC~~ SOLN
120.0000 mg | Freq: Once | SUBCUTANEOUS | Status: AC
  Administered 2017-07-24: 120 mg via SUBCUTANEOUS
  Filled 2017-07-24: qty 1.7

## 2017-07-24 MED ORDER — SODIUM CHLORIDE 0.9 % IV SOLN
80.0000 mg/m2 | Freq: Once | INTRAVENOUS | Status: AC
  Administered 2017-07-24: 150 mg via INTRAVENOUS
  Filled 2017-07-24: qty 25

## 2017-07-24 MED ORDER — SODIUM CHLORIDE 0.9% FLUSH
10.0000 mL | INTRAVENOUS | Status: DC | PRN
  Administered 2017-07-24: 10 mL
  Filled 2017-07-24: qty 10

## 2017-07-24 NOTE — Patient Instructions (Signed)
[facility] Discharge Instructions for Patients Receiving Chemotherapy  Today you received the following chemotherapy agents: Carboplatin and Taxol  To help prevent nausea and vomiting after your treatment, we encourage you to take your nausea medication as directed   If you develop nausea and vomiting that is not controlled by your nausea medication, call the clinic.   BELOW ARE SYMPTOMS THAT SHOULD BE REPORTED IMMEDIATELY:  *FEVER GREATER THAN 100.5 F  *CHILLS WITH OR WITHOUT FEVER  NAUSEA AND VOMITING THAT IS NOT CONTROLLED WITH YOUR NAUSEA MEDICATION  *UNUSUAL SHORTNESS OF BREATH  *UNUSUAL BRUISING OR BLEEDING  TENDERNESS IN MOUTH AND THROAT WITH OR WITHOUT PRESENCE OF ULCERS  *URINARY PROBLEMS  *BOWEL PROBLEMS  UNUSUAL RASH Items with * indicate a potential emergency and should be followed up as soon as possible.  Feel free to call the clinic should you have any questions or concerns. The clinic phone number is [phone].  Please show the CHEMO ALERT CARD at check-in to the Emergency Department and triage nurse.   

## 2017-07-26 DIAGNOSIS — S42295A Other nondisplaced fracture of upper end of left humerus, initial encounter for closed fracture: Secondary | ICD-10-CM | POA: Diagnosis not present

## 2017-07-26 DIAGNOSIS — M67911 Unspecified disorder of synovium and tendon, right shoulder: Secondary | ICD-10-CM | POA: Diagnosis not present

## 2017-07-30 ENCOUNTER — Other Ambulatory Visit: Payer: Self-pay | Admitting: Oncology

## 2017-07-31 ENCOUNTER — Other Ambulatory Visit: Payer: Self-pay | Admitting: *Deleted

## 2017-07-31 ENCOUNTER — Other Ambulatory Visit (HOSPITAL_BASED_OUTPATIENT_CLINIC_OR_DEPARTMENT_OTHER): Payer: Medicare Other

## 2017-07-31 ENCOUNTER — Ambulatory Visit (HOSPITAL_BASED_OUTPATIENT_CLINIC_OR_DEPARTMENT_OTHER): Payer: Medicare Other

## 2017-07-31 ENCOUNTER — Ambulatory Visit (HOSPITAL_BASED_OUTPATIENT_CLINIC_OR_DEPARTMENT_OTHER): Payer: Medicare Other | Admitting: Oncology

## 2017-07-31 ENCOUNTER — Ambulatory Visit: Payer: Medicare Other

## 2017-07-31 ENCOUNTER — Telehealth: Payer: Self-pay | Admitting: Oncology

## 2017-07-31 VITALS — BP 110/57 | HR 105 | Temp 98.6°F | Resp 18 | Ht 65.0 in | Wt 163.2 lb

## 2017-07-31 DIAGNOSIS — Z853 Personal history of malignant neoplasm of breast: Secondary | ICD-10-CM | POA: Diagnosis not present

## 2017-07-31 DIAGNOSIS — Z9889 Other specified postprocedural states: Secondary | ICD-10-CM

## 2017-07-31 DIAGNOSIS — Z5111 Encounter for antineoplastic chemotherapy: Secondary | ICD-10-CM | POA: Diagnosis present

## 2017-07-31 DIAGNOSIS — C7951 Secondary malignant neoplasm of bone: Secondary | ICD-10-CM

## 2017-07-31 DIAGNOSIS — C50919 Malignant neoplasm of unspecified site of unspecified female breast: Secondary | ICD-10-CM

## 2017-07-31 DIAGNOSIS — M84422A Pathological fracture, left humerus, initial encounter for fracture: Secondary | ICD-10-CM

## 2017-07-31 DIAGNOSIS — Z9011 Acquired absence of right breast and nipple: Secondary | ICD-10-CM

## 2017-07-31 DIAGNOSIS — Z9221 Personal history of antineoplastic chemotherapy: Secondary | ICD-10-CM

## 2017-07-31 DIAGNOSIS — G893 Neoplasm related pain (acute) (chronic): Secondary | ICD-10-CM | POA: Diagnosis not present

## 2017-07-31 LAB — CBC WITH DIFFERENTIAL/PLATELET
BASO%: 0.4 % (ref 0.0–2.0)
Basophils Absolute: 0 10*3/uL (ref 0.0–0.1)
EOS%: 0.4 % (ref 0.0–7.0)
Eosinophils Absolute: 0 10*3/uL (ref 0.0–0.5)
HCT: 30.9 % — ABNORMAL LOW (ref 34.8–46.6)
HGB: 10.3 g/dL — ABNORMAL LOW (ref 11.6–15.9)
LYMPH%: 23.2 % (ref 14.0–49.7)
MCH: 33.4 pg (ref 25.1–34.0)
MCHC: 33.3 g/dL (ref 31.5–36.0)
MCV: 100.3 fL (ref 79.5–101.0)
MONO#: 0.3 10*3/uL (ref 0.1–0.9)
MONO%: 5.7 % (ref 0.0–14.0)
NEUT%: 70.3 % (ref 38.4–76.8)
NEUTROS ABS: 3.6 10*3/uL (ref 1.5–6.5)
PLATELETS: 228 10*3/uL (ref 145–400)
RBC: 3.08 10*6/uL — AB (ref 3.70–5.45)
RDW: 15.5 % — ABNORMAL HIGH (ref 11.2–14.5)
WBC: 5.1 10*3/uL (ref 3.9–10.3)
lymph#: 1.2 10*3/uL (ref 0.9–3.3)

## 2017-07-31 LAB — COMPREHENSIVE METABOLIC PANEL
ALBUMIN: 4 g/dL (ref 3.5–5.0)
ALK PHOS: 144 U/L (ref 40–150)
ALT: 50 U/L (ref 0–55)
AST: 39 U/L — AB (ref 5–34)
Anion Gap: 8 mEq/L (ref 3–11)
BILIRUBIN TOTAL: 0.49 mg/dL (ref 0.20–1.20)
BUN: 14.4 mg/dL (ref 7.0–26.0)
CALCIUM: 13.4 mg/dL — AB (ref 8.4–10.4)
CO2: 22 mEq/L (ref 22–29)
CREATININE: 0.8 mg/dL (ref 0.6–1.1)
Chloride: 109 mEq/L (ref 98–109)
EGFR: >60 mL/min/{1.73_m2} (ref >60)
GLUCOSE: 126 mg/dL (ref 70–140)
Potassium: 3.3 mEq/L — ABNORMAL LOW (ref 3.5–5.1)
SODIUM: 139 meq/L (ref 136–145)
Total Protein: 7.5 g/dL (ref 6.4–8.3)

## 2017-07-31 LAB — TECHNOLOGIST REVIEW

## 2017-07-31 MED ORDER — SODIUM CHLORIDE 0.9 % IV SOLN
Freq: Once | INTRAVENOUS | Status: AC
  Administered 2017-07-31: 13:00:00 via INTRAVENOUS

## 2017-07-31 MED ORDER — DIPHENHYDRAMINE HCL 50 MG/ML IJ SOLN
INTRAMUSCULAR | Status: AC
  Filled 2017-07-31: qty 1

## 2017-07-31 MED ORDER — ONDANSETRON HCL 8 MG PO TABS
8.0000 mg | ORAL_TABLET | Freq: Once | ORAL | Status: AC
  Administered 2017-07-31: 8 mg via ORAL

## 2017-07-31 MED ORDER — DEXAMETHASONE SODIUM PHOSPHATE 10 MG/ML IJ SOLN
10.0000 mg | Freq: Once | INTRAMUSCULAR | Status: AC
  Administered 2017-07-31: 10 mg via INTRAVENOUS

## 2017-07-31 MED ORDER — LIDOCAINE-PRILOCAINE 2.5-2.5 % EX CREA: 1.0000 "application " | TOPICAL_CREAM | CUTANEOUS | 1 refills | Status: AC | PRN

## 2017-07-31 MED ORDER — DIPHENHYDRAMINE HCL 50 MG/ML IJ SOLN
25.0000 mg | Freq: Once | INTRAMUSCULAR | Status: AC
  Administered 2017-07-31: 25 mg via INTRAVENOUS

## 2017-07-31 MED ORDER — SODIUM CHLORIDE 0.9 % IV SOLN
186.8000 mg | Freq: Once | INTRAVENOUS | Status: AC
  Administered 2017-07-31: 190 mg via INTRAVENOUS
  Filled 2017-07-31: qty 19

## 2017-07-31 MED ORDER — ONDANSETRON HCL 8 MG PO TABS
ORAL_TABLET | ORAL | Status: AC
  Filled 2017-07-31: qty 1

## 2017-07-31 MED ORDER — SODIUM CHLORIDE 0.9% FLUSH
10.0000 mL | Freq: Once | INTRAVENOUS | Status: AC
  Administered 2017-07-31: 10 mL
  Filled 2017-07-31: qty 10

## 2017-07-31 MED ORDER — DENOSUMAB 120 MG/1.7ML ~~LOC~~ SOLN
120.0000 mg | Freq: Once | SUBCUTANEOUS | Status: AC
  Administered 2017-07-31: 120 mg via SUBCUTANEOUS
  Filled 2017-07-31: qty 1.7

## 2017-07-31 MED ORDER — SODIUM CHLORIDE 0.9% FLUSH
10.0000 mL | INTRAVENOUS | Status: DC | PRN
  Administered 2017-07-31: 10 mL
  Filled 2017-07-31: qty 10

## 2017-07-31 MED ORDER — HEPARIN SOD (PORK) LOCK FLUSH 100 UNIT/ML IV SOLN
500.0000 [IU] | Freq: Once | INTRAVENOUS | Status: AC | PRN
  Administered 2017-07-31: 500 [IU]
  Filled 2017-07-31: qty 5

## 2017-07-31 MED ORDER — FAMOTIDINE IN NACL 20-0.9 MG/50ML-% IV SOLN
INTRAVENOUS | Status: AC
  Filled 2017-07-31: qty 50

## 2017-07-31 MED ORDER — FAMOTIDINE IN NACL 20-0.9 MG/50ML-% IV SOLN
20.0000 mg | Freq: Once | INTRAVENOUS | Status: AC
  Administered 2017-07-31: 20 mg via INTRAVENOUS

## 2017-07-31 MED ORDER — DEXAMETHASONE SODIUM PHOSPHATE 10 MG/ML IJ SOLN
INTRAMUSCULAR | Status: AC
  Filled 2017-07-31: qty 1

## 2017-07-31 MED ORDER — SODIUM CHLORIDE 0.9 % IV SOLN
80.0000 mg/m2 | Freq: Once | INTRAVENOUS | Status: AC
  Administered 2017-07-31: 150 mg via INTRAVENOUS
  Filled 2017-07-31: qty 25

## 2017-07-31 NOTE — Telephone Encounter (Signed)
Scheduled appt per 11/12 los - Gave patient AVS and calender per los.  

## 2017-07-31 NOTE — Progress Notes (Signed)
[facility] OFFICE PROGRESS NOTE   Diagnosis: Breast cancer  INTERVAL HISTORY:   Ms. Keadle returns as scheduled.  She completed another treatment with Taxol/carboplatin 07/24/2017.  No neuropathy symptoms.  She continues to have pain at multiple bone sites including the left upper arm.  The range of motion is limited at the left arm.  A plain x-ray on 07/25/2017 revealed a proximal nondisplaced left humerus fracture.  She saw orthopedics and is wearing a sling.  She is scheduled for a follow-up appointment and x-ray with orthopedics later this week. She is staying at home most of the time.   Objective:  Vital signs in last 24 hours:  Blood pressure (!) 110/57, pulse (!) 105, temperature 98.6 F (37 C), temperature source Oral, resp. rate 18, height '5\' 5"'  (1.651 m), weight 163 lb 3.2 oz (74 kg), last menstrual period 10/08/2014, SpO2 99 %.    HEENT: No thrush or ulcers Resp: Lungs clear bilaterally Cardio: Regular rate and rhythm GI: No hepatomegaly, nontender Vascular: No leg edema  Portacath/PICC-without erythema  Lab Results:  Lab Results  Component Value Date   WBC 5.1 07/31/2017   HGB 10.3 (L) 07/31/2017   HCT 30.9 (L) 07/31/2017   MCV 100.3 07/31/2017   PLT 228 07/31/2017   NEUTROABS 3.6 07/31/2017    CMP     Component Value Date/Time   NA 139 07/31/2017 1030   K 3.3 (L) 07/31/2017 1030   CL 107 12/20/2012 0927   CO2 22 07/31/2017 1030   GLUCOSE 126 07/31/2017 1030   GLUCOSE 89 12/20/2012 0927   BUN 14.4 07/31/2017 1030   CREATININE 0.8 07/31/2017 1030   CALCIUM 13.4 (HH) 07/31/2017 1030   PROT 7.5 07/31/2017 1030   ALBUMIN 4.0 07/31/2017 1030   AST 39 (H) 07/31/2017 1030   ALT 50 07/31/2017 1030   ALKPHOS 144 07/31/2017 1030   BILITOT 0.49 07/31/2017 1030    Medications: I have reviewed the patient's current medications.  Assessment/Plan: 1. Multicentric invasive lobular carcinoma of the right breast diagnosed in December of 1999, a  right mastectomy followed by adjuvant [facility] chemotherapy, 5 years of tamoxifen, and 5 years of Femara. The Femara was completed in June of 2010. The right breast mass excision in January 2000 was ER positive, PR positive, and HER-2 negative  2. Pain at the left groin area with a CT of the pelvis on 02/07/12 confirming a destructive lytic lesion at the left pubic symphysis, status post a CT-guided biopsy on 02/16/12 confirming metastatic carcinoma, focally ER positive .  -PET scan 03/07/2012 confirmed multiple hypermetabolic bone metastases with no other evidence of metastatic disease  -Status post palliative radiation to the pelvis beginning on 03/12/2012  -Initiation of Arimidex on 03/27/2012 -initiation of every three-month xgeva on 04/04/2012  -PET scan September 14 [id number] new hypermetabolic bone lesions  -Initiation of Faslodex 09/27/2012  -Restaging PET scan 04/08/2013 with evidence of disease progression in the bones  -Initiation of Aromasin/afinitor 05/29/2013  -Iliac biopsy at [facility] 05/22/2013 confirming HER-2 negative metastatic breast  -PET scan 11/18/2013 with a decrease in hypermetabolic activity  -PET scan 10/08/2014 with progressive diffuse hypermetabolic bone metastases -Initiation of Palbociclib 10/20/2014; Faslodex 10/23/2014. -Palbociclib placed on hold beginning 11/07/2014 due to neutropenia. -Palbociclib resumed 11/27/2014 on an every other day schedule -Palbociclib resumed 01/04/2015 at a reduced dose of 100 mg daily for 21 days. -Palbociclib beginning 02/12/2015 at a dose of 100 mg daily for 14 days on/14 days off. -PET scan 05/18/2015 with diffuse  hypermetabolic bone lesions, increased metabolic activity compared to the PET scan from January 2016 -Palbociclib resumed 06/07/2015, Faslodex continued 06/12/2015 -PET scan 09/22/2015 with diffuse hypermetabolic bone lesions, increased metabolic activity compared to the PET scan 05/18/2015 -Continuation of palbociclib  and Faslodex -palbociclib and Faslodex discontinued April 2017 secondary to a C7 spinous process fracture and hypercalcemia -PET scan [id number] hypermetabolic bone metastases, with increased metabolic activity interpreted as consistent with disease progression - Xeloda (7 days on/7 days off) started 02/28/2016 -PET scan 08/31/2016-marked improvement in hypermetabolic bone activity, no extra osseous disease -Xeloda continued -PET scan 06/05/2017-progression of bone metastase including a T3 lesion with paraspinal soft tissue, destructive lesion at T8 and progressive lesions at the shoulders, ribs, sternum, and pelvis -Xeloda discontinued 06/08/2017 -Initiation of weekly Taxol 06/26/2017, carboplatin added with week 3 07/10/2017 3. BRCA1 variant felt to be a polymorphism  4. Pain/tenderness at the left low anterolateral chest wall-she completed palliative radiation on 05/28/2012 with improvement in the pain. She was also treated with radiation to the thoracic spine.  5. Anemia/leukopenia-likely secondary to breast cancer involving the bone marrow and radiation /afinitor -the anemia is improved  6. History of mildly elevated liver enzymes-? Related to afinitor,? Secondary to metastatic breast cancer 7. Neutropenia secondary to Palbociclib. Improved. 8. Pain at the left shoulder and mid back-a plain x-ray of the left shoulder 06/12/2015 revealed a sclerotic density at the left scapula adjacent to the glenoid, there is metastatic disease involving the humeral head and left scapula on the PET scan 05/18/2015. She completed a course of radiation to the left shoulder on 08/05/2015. 9. Low neck/upper back pain 12/24/2015 with MRI showing a mildly displaced acute-appearing C7 spinous process fracture; widespread osseous metastatic disease; no evidence of epidural tumor or spinal cord compression. - Completed palliative radiation to the cervical spine 02/17/2016 10. Dysphagia/odynophagia secondary  to radiation-resolved 11. Xeloda-induced skin rash 12. Hypercalcemia secondary to #1. Status post Zometa 06/08/2017, 06/19/2017, and 07/03/2017.hypercalcemia. Pamidronate 06/26/2017.Calcitonin 07/03/2017, 07/04/2017, 07/06/2017; improved 07/07/2017; IV fluids and Xgeva 07/07/2017 13.  Nondisplaced left humerus fracture- likely a pathologic fracture, followed by orthopedics    Disposition:  Ms. Boesen appears unchanged.  She has completed 4 treatments with weekly Taxol (2 with carboplatin).  Her overall clinical status appears unchanged.  The plan is to continue Taxol/carboplatin on a day 1/day 8 schedule. The calcium level remains elevated, but stable.  She will complete another treatment with Xgeva today.  She will continue hydrocodone as needed for pain.  She will see orthopedics later this week to follow-up on the left humerus fracture.  25 minutes were spent with the patient today.  The majority of the time was used for counseling and coordination of care.  [name], MD  07/31/2017  11:52 AM

## 2017-07-31 NOTE — Patient Instructions (Signed)
[place] [place] Discharge Instructions for Patients Receiving Chemotherapy  Today you received the following chemotherapy agents :  Taxol, Carboplatin, Xgeva.  To help prevent nausea and vomiting after your treatment, we encourage you to take your nausea medication as prescribed.   If you develop nausea and vomiting that is not controlled by your nausea medication, call the clinic.   BELOW ARE SYMPTOMS THAT SHOULD BE REPORTED IMMEDIATELY:  *FEVER GREATER THAN 100.5 F  *CHILLS WITH OR WITHOUT FEVER  NAUSEA AND VOMITING THAT IS NOT CONTROLLED WITH YOUR NAUSEA MEDICATION  *UNUSUAL SHORTNESS OF BREATH  *UNUSUAL BRUISING OR BLEEDING  TENDERNESS IN MOUTH AND THROAT WITH OR WITHOUT PRESENCE OF ULCERS  *URINARY PROBLEMS  *BOWEL PROBLEMS  UNUSUAL RASH Items with * indicate a potential emergency and should be followed up as soon as possible.  Feel free to call the clinic should you have any questions or concerns. The clinic phone number is [phone].  Please show the [facility] at check-in to the Emergency Department and triage nurse.

## 2017-08-03 DIAGNOSIS — M67911 Unspecified disorder of synovium and tendon, right shoulder: Secondary | ICD-10-CM | POA: Diagnosis not present

## 2017-08-06 ENCOUNTER — Other Ambulatory Visit: Payer: Self-pay | Admitting: Oncology

## 2017-08-07 ENCOUNTER — Telehealth: Payer: Self-pay | Admitting: Nurse Practitioner

## 2017-08-07 ENCOUNTER — Other Ambulatory Visit (HOSPITAL_BASED_OUTPATIENT_CLINIC_OR_DEPARTMENT_OTHER): Payer: Medicare Other

## 2017-08-07 ENCOUNTER — Other Ambulatory Visit: Payer: Medicare Other

## 2017-08-07 ENCOUNTER — Encounter: Payer: Self-pay | Admitting: Nurse Practitioner

## 2017-08-07 ENCOUNTER — Ambulatory Visit (HOSPITAL_BASED_OUTPATIENT_CLINIC_OR_DEPARTMENT_OTHER): Payer: Medicare Other | Admitting: Nurse Practitioner

## 2017-08-07 ENCOUNTER — Ambulatory Visit: Payer: Medicare Other

## 2017-08-07 ENCOUNTER — Ambulatory Visit (HOSPITAL_BASED_OUTPATIENT_CLINIC_OR_DEPARTMENT_OTHER): Payer: Medicare Other

## 2017-08-07 VITALS — BP 111/64 | HR 83 | Temp 98.2°F | Ht 65.0 in | Wt 164.4 lb

## 2017-08-07 DIAGNOSIS — Z5111 Encounter for antineoplastic chemotherapy: Secondary | ICD-10-CM

## 2017-08-07 DIAGNOSIS — C7951 Secondary malignant neoplasm of bone: Secondary | ICD-10-CM

## 2017-08-07 DIAGNOSIS — C50919 Malignant neoplasm of unspecified site of unspecified female breast: Secondary | ICD-10-CM

## 2017-08-07 DIAGNOSIS — Z9889 Other specified postprocedural states: Secondary | ICD-10-CM

## 2017-08-07 LAB — COMPREHENSIVE METABOLIC PANEL
ALT: 49 U/L (ref 0–55)
ANION GAP: 6 meq/L (ref 3–11)
AST: 43 U/L — ABNORMAL HIGH (ref 5–34)
Albumin: 3.9 g/dL (ref 3.5–5.0)
Alkaline Phosphatase: 114 U/L (ref 40–150)
BILIRUBIN TOTAL: 0.62 mg/dL (ref 0.20–1.20)
BUN: 9.8 mg/dL (ref 7.0–26.0)
CHLORIDE: 108 meq/L (ref 98–109)
CO2: 21 meq/L — AB (ref 22–29)
CREATININE: 0.7 mg/dL (ref 0.6–1.1)
Calcium: 12.2 mg/dL — ABNORMAL HIGH (ref 8.4–10.4)
Glucose: 90 mg/dl (ref 70–140)
Potassium: 3.7 mEq/L (ref 3.5–5.1)
Sodium: 135 mEq/L — ABNORMAL LOW (ref 136–145)
TOTAL PROTEIN: 7.1 g/dL (ref 6.4–8.3)

## 2017-08-07 LAB — CBC WITH DIFFERENTIAL/PLATELET
BASO%: 0.3 % (ref 0.0–2.0)
BASOS ABS: 0 10*3/uL (ref 0.0–0.1)
EOS ABS: 0 10*3/uL (ref 0.0–0.5)
EOS%: 0.3 % (ref 0.0–7.0)
HEMATOCRIT: 27.9 % — AB (ref 34.8–46.6)
HGB: 9.3 g/dL — ABNORMAL LOW (ref 11.6–15.9)
LYMPH%: 27.6 % (ref 14.0–49.7)
MCH: 32.9 pg (ref 25.1–34.0)
MCHC: 33.3 g/dL (ref 31.5–36.0)
MCV: 98.6 fL (ref 79.5–101.0)
MONO#: 0.2 10*3/uL (ref 0.1–0.9)
MONO%: 5.5 % (ref 0.0–14.0)
NEUT#: 2.4 10*3/uL (ref 1.5–6.5)
NEUT%: 66.3 % (ref 38.4–76.8)
PLATELETS: 159 10*3/uL (ref 145–400)
RBC: 2.83 10*6/uL — AB (ref 3.70–5.45)
RDW: 15.9 % — ABNORMAL HIGH (ref 11.2–14.5)
WBC: 3.6 10*3/uL — ABNORMAL LOW (ref 3.9–10.3)
lymph#: 1 10*3/uL (ref 0.9–3.3)
nRBC: 3 % — ABNORMAL HIGH (ref 0–0)

## 2017-08-07 MED ORDER — SODIUM CHLORIDE 0.9 % IV SOLN
80.0000 mg/m2 | Freq: Once | INTRAVENOUS | Status: AC
  Administered 2017-08-07: 150 mg via INTRAVENOUS
  Filled 2017-08-07: qty 25

## 2017-08-07 MED ORDER — DIPHENHYDRAMINE HCL 50 MG/ML IJ SOLN
25.0000 mg | Freq: Once | INTRAMUSCULAR | Status: AC
  Administered 2017-08-07: 25 mg via INTRAVENOUS

## 2017-08-07 MED ORDER — ONDANSETRON HCL 8 MG PO TABS
ORAL_TABLET | ORAL | Status: AC
  Filled 2017-08-07: qty 1

## 2017-08-07 MED ORDER — FAMOTIDINE IN NACL 20-0.9 MG/50ML-% IV SOLN
INTRAVENOUS | Status: AC
  Filled 2017-08-07: qty 50

## 2017-08-07 MED ORDER — CARBOPLATIN CHEMO INJECTION 450 MG/45ML
186.8000 mg | Freq: Once | INTRAVENOUS | Status: AC
  Administered 2017-08-07: 190 mg via INTRAVENOUS
  Filled 2017-08-07: qty 19

## 2017-08-07 MED ORDER — SODIUM CHLORIDE 0.9% FLUSH
10.0000 mL | INTRAVENOUS | Status: DC | PRN
  Administered 2017-08-07: 10 mL
  Filled 2017-08-07: qty 10

## 2017-08-07 MED ORDER — DEXAMETHASONE SODIUM PHOSPHATE 10 MG/ML IJ SOLN
INTRAMUSCULAR | Status: AC
  Filled 2017-08-07: qty 1

## 2017-08-07 MED ORDER — DEXAMETHASONE SODIUM PHOSPHATE 10 MG/ML IJ SOLN
10.0000 mg | Freq: Once | INTRAMUSCULAR | Status: AC
  Administered 2017-08-07: 10 mg via INTRAVENOUS

## 2017-08-07 MED ORDER — DIPHENHYDRAMINE HCL 50 MG/ML IJ SOLN
INTRAMUSCULAR | Status: AC
  Filled 2017-08-07: qty 1

## 2017-08-07 MED ORDER — HYDROCODONE-ACETAMINOPHEN 7.5-325 MG PO TABS: 1.0000 | ORAL_TABLET | ORAL | 0 refills | Status: DC | PRN

## 2017-08-07 MED ORDER — SODIUM CHLORIDE 0.9% FLUSH
10.0000 mL | Freq: Once | INTRAVENOUS | Status: AC
  Administered 2017-08-07: 10 mL
  Filled 2017-08-07: qty 10

## 2017-08-07 MED ORDER — FAMOTIDINE IN NACL 20-0.9 MG/50ML-% IV SOLN
20.0000 mg | Freq: Once | INTRAVENOUS | Status: AC
  Administered 2017-08-07: 20 mg via INTRAVENOUS

## 2017-08-07 MED ORDER — ONDANSETRON HCL 8 MG PO TABS
8.0000 mg | ORAL_TABLET | Freq: Once | ORAL | Status: AC
  Administered 2017-08-07: 8 mg via ORAL

## 2017-08-07 MED ORDER — SODIUM CHLORIDE 0.9 % IV SOLN
Freq: Once | INTRAVENOUS | Status: AC
  Administered 2017-08-07: 12:00:00 via INTRAVENOUS

## 2017-08-07 MED ORDER — HEPARIN SOD (PORK) LOCK FLUSH 100 UNIT/ML IV SOLN
500.0000 [IU] | Freq: Once | INTRAVENOUS | Status: AC | PRN
  Administered 2017-08-07: 500 [IU]
  Filled 2017-08-07: qty 5

## 2017-08-07 NOTE — Progress Notes (Signed)
No Xgeva today per Lavella Lemons RN per Dr. Benay Spice.

## 2017-08-07 NOTE — Telephone Encounter (Signed)
Scheduled appt per 11/19 los - Gave patient AVS and calender per los.  

## 2017-08-07 NOTE — Progress Notes (Signed)
[place] OFFICE PROGRESS NOTE   Diagnosis: Breast cancer  INTERVAL HISTORY:   Stacy Horn returns as scheduled.  She completed cycle 2-day 8 Taxol/carboplatin 07/31/2017.  She denies nausea/vomiting.  No mouth sores.  No diarrhea.  No change in baseline numbness/tingling right foot.  She continues to wear the left arm in a sling.  Pain overall is better.  She takes hydrocodone as needed.  Objective:  Vital signs in last 24 hours:  Blood pressure 111/64, pulse 83, temperature 98.2 F (36.8 C), temperature source Oral, height '5\' 5"'  (1.651 m), weight 164 lb 6.4 oz (74.6 kg), last menstrual period 10/08/2014, SpO2 100 %.    HEENT: No thrush or ulcers. Resp: Lungs clear bilaterally. Cardio: Regular rate and rhythm. GI: Abdomen soft and nontender.  No hepatomegaly. Vascular: No leg edema. Port-A-Cath without erythema.  Lab Results:  Lab Results  Component Value Date   WBC 3.6 (L) 08/07/2017   HGB 9.3 (L) 08/07/2017   HCT 27.9 (L) 08/07/2017   MCV 98.6 08/07/2017   PLT 159 08/07/2017   NEUTROABS 2.4 08/07/2017    Imaging:  No results found.  Medications: I have reviewed the patient's current medications.  Assessment/Plan: 1. Multicentric invasive lobular carcinoma of the right breast diagnosed in December of 1999, a right mastectomy followed by adjuvant [facility] chemotherapy, 5 years of tamoxifen, and 5 years of Femara. The Femara was completed in June of 2010. The right breast mass excision in January 2000 was ER positive, PR positive, and HER-2 negative  2. Pain at the left groin area with a CT of the pelvis on 02/07/12 confirming a destructive lytic lesion at the left pubic symphysis, status post a CT-guided biopsy on 02/16/12 confirming metastatic carcinoma, focally ER positive .  -PET scan 03/07/2012 confirmed multiple hypermetabolic bone metastases with no other evidence of metastatic disease  -Status post palliative radiation to the pelvis beginning on  03/12/2012  -Initiation of Arimidex on 03/27/2012 -initiation of every three-month xgeva on 04/04/2012  -PET scan September 14 [id number] new hypermetabolic bone lesions  -Initiation of Faslodex 09/27/2012  -Restaging PET scan 04/08/2013 with evidence of disease progression in the bones  -Initiation of Aromasin/afinitor 05/29/2013  -Iliac biopsy at [facility] 05/22/2013 confirming HER-2 negative metastatic breast  -PET scan 11/18/2013 with a decrease in hypermetabolic activity  -PET scan 10/08/2014 with progressive diffuse hypermetabolic bone metastases -Initiation of Palbociclib 10/20/2014; Faslodex 10/23/2014. -Palbociclib placed on hold beginning 11/07/2014 due to neutropenia. -Palbociclib resumed 11/27/2014 on an every other day schedule -Palbociclib resumed 01/04/2015 at a reduced dose of 100 mg daily for 21 days. -Palbociclib beginning 02/12/2015 at a dose of 100 mg daily for 14 days on/14 days off. -PET scan 05/18/2015 with diffuse hypermetabolic bone lesions, increased metabolic activity compared to the PET scan from January 2016 -Palbociclib resumed 06/07/2015, Faslodex continued 06/12/2015 -PET scan 09/22/2015 with diffuse hypermetabolic bone lesions, increased metabolic activity compared to the PET scan 05/18/2015 -Continuation of palbociclib and Faslodex -palbociclib and Faslodex discontinued April 2017 secondary to a C7 spinous process fracture and hypercalcemia -PET scan 95/39/[id number] hypermetabolic bone metastases, with increased metabolic activity interpreted as consistent with disease progression - Xeloda (7 days on/7 days off) started 02/28/2016 -PET scan 08/31/2016-marked improvement in hypermetabolic bone activity, no extra osseous disease -Xeloda continued -PET scan 06/05/2017-progression of bone metastase including a T3 lesion with paraspinal soft tissue, destructive lesion at T8 and progressive lesions at the shoulders, ribs, sternum, and pelvis -Xeloda  discontinued 06/08/2017 -Initiation of weekly Taxol 06/26/2017, carboplatin  added with week 3 07/10/2017 3. BRCA1 variant felt to be a polymorphism  4. Pain/tenderness at the left low anterolateral chest wall-she completed palliative radiation on 05/28/2012 with improvement in the pain. She was also treated with radiation to the thoracic spine.  5. Anemia/leukopenia-likely secondary to breast cancer involving the bone marrow and radiation /afinitor -the anemia is improved  6. History of mildly elevated liver enzymes-? Related to afinitor,? Secondary to metastatic breast cancer 7. Neutropenia secondary to Palbociclib. Improved. 8. Pain at the left shoulder and mid back-a plain x-ray of the left shoulder 06/12/2015 revealed a sclerotic density at the left scapula adjacent to the glenoid, there is metastatic disease involving the humeral head and left scapula on the PET scan 05/18/2015. She completed a course of radiation to the left shoulder on 08/05/2015. 9. Low neck/upper back pain 12/24/2015 with MRI showing a mildly displaced acute-appearing C7 spinous process fracture; widespread osseous metastatic disease; no evidence of epidural tumor or spinal cord compression. - Completed palliative radiation to the cervical spine 02/17/2016 10. Dysphagia/odynophagia secondary to radiation-resolved 11. Xeloda-induced skin rash 12. Hypercalcemia secondary to #1. Status post Zometa 06/08/2017, 06/19/2017, and 07/03/2017.Pamidronate 06/26/2017.Calcitonin 07/03/2017, 07/04/2017, 07/06/2017; improved 07/07/2017; IV fluids and Xgeva 07/07/2017 13.  Nondisplaced left humerus fracture- likely a pathologic fracture, followed by orthopedics   Disposition: Stacy Horn appears unchanged.  Plan to proceed with cycle 2-day 15 Taxol/carboplatin today as scheduled.   The calcium level remains elevated but is lower.  She will return for a chemistry panel 08/14/2017.   She was provided with a new hydrocodone  prescription at today's visit.  We will see her in follow-up prior to beginning the third cycle of chemotherapy on 08/21/2017.  She will contact the office in the interim with any problems.  Plan reviewed with Dr. Benay Spice.  Ned Card ANP/GNP-BC   08/07/2017  11:30 AM

## 2017-08-07 NOTE — Patient Instructions (Signed)
   [facility] Discharge Instructions for Patients Receiving Chemotherapy  Today you received the following chemotherapy agents Taxol and Carboplatin   To help prevent nausea and vomiting after your treatment, we encourage you to take your nausea medication as directed.    If you develop nausea and vomiting that is not controlled by your nausea medication, call the clinic.   BELOW ARE SYMPTOMS THAT SHOULD BE REPORTED IMMEDIATELY:  *FEVER GREATER THAN 100.5 F  *CHILLS WITH OR WITHOUT FEVER  NAUSEA AND VOMITING THAT IS NOT CONTROLLED WITH YOUR NAUSEA MEDICATION  *UNUSUAL SHORTNESS OF BREATH  *UNUSUAL BRUISING OR BLEEDING  TENDERNESS IN MOUTH AND THROAT WITH OR WITHOUT PRESENCE OF ULCERS  *URINARY PROBLEMS  *BOWEL PROBLEMS  UNUSUAL RASH Items with * indicate a potential emergency and should be followed up as soon as possible.  Feel free to call the clinic should you have any questions or concerns. The clinic phone number is [phone].  Please show the CHEMO ALERT CARD at check-in to the Emergency Department and triage nurse.   

## 2017-08-14 ENCOUNTER — Other Ambulatory Visit (HOSPITAL_BASED_OUTPATIENT_CLINIC_OR_DEPARTMENT_OTHER): Payer: Medicare Other

## 2017-08-14 ENCOUNTER — Telehealth: Payer: Self-pay | Admitting: *Deleted

## 2017-08-14 ENCOUNTER — Other Ambulatory Visit (HOSPITAL_BASED_OUTPATIENT_CLINIC_OR_DEPARTMENT_OTHER): Payer: Medicare Other | Admitting: *Deleted

## 2017-08-14 DIAGNOSIS — C7951 Secondary malignant neoplasm of bone: Principal | ICD-10-CM

## 2017-08-14 DIAGNOSIS — C50919 Malignant neoplasm of unspecified site of unspecified female breast: Secondary | ICD-10-CM

## 2017-08-14 LAB — CBC WITH DIFFERENTIAL/PLATELET
BASO%: 0.3 % (ref 0.0–2.0)
BASOS ABS: 0 10*3/uL (ref 0.0–0.1)
EOS ABS: 0 10*3/uL (ref 0.0–0.5)
EOS%: 0.3 % (ref 0.0–7.0)
HCT: 30.9 % — ABNORMAL LOW (ref 34.8–46.6)
HGB: 10.4 g/dL — ABNORMAL LOW (ref 11.6–15.9)
LYMPH%: 31.2 % (ref 14.0–49.7)
MCH: 33.4 pg (ref 25.1–34.0)
MCHC: 33.7 g/dL (ref 31.5–36.0)
MCV: 99.4 fL (ref 79.5–101.0)
MONO#: 0.2 10*3/uL (ref 0.1–0.9)
MONO%: 6.3 % (ref 0.0–14.0)
NEUT#: 2.4 10*3/uL (ref 1.5–6.5)
NEUT%: 61.9 % (ref 38.4–76.8)
PLATELETS: 209 10*3/uL (ref 145–400)
RBC: 3.11 10*6/uL — AB (ref 3.70–5.45)
RDW: 16.6 % — ABNORMAL HIGH (ref 11.2–14.5)
WBC: 3.8 10*3/uL — ABNORMAL LOW (ref 3.9–10.3)
lymph#: 1.2 10*3/uL (ref 0.9–3.3)

## 2017-08-14 LAB — COMPREHENSIVE METABOLIC PANEL
ALBUMIN: 4.2 g/dL (ref 3.5–5.0)
ALK PHOS: 121 U/L (ref 40–150)
ALT: 64 U/L — ABNORMAL HIGH (ref 0–55)
ANION GAP: 10 meq/L (ref 3–11)
AST: 58 U/L — ABNORMAL HIGH (ref 5–34)
BILIRUBIN TOTAL: 0.73 mg/dL (ref 0.20–1.20)
BUN: 9.3 mg/dL (ref 7.0–26.0)
CO2: 21 mEq/L — ABNORMAL LOW (ref 22–29)
Calcium: 13.6 mg/dL (ref 8.4–10.4)
Chloride: 108 mEq/L (ref 98–109)
Creatinine: 0.8 mg/dL (ref 0.6–1.1)
EGFR: >60 mL/min/{1.73_m2} (ref >60)
Glucose: 134 mg/dl (ref 70–140)
POTASSIUM: 3.1 meq/L — AB (ref 3.5–5.1)
Sodium: 138 mEq/L (ref 136–145)
TOTAL PROTEIN: 7.6 g/dL (ref 6.4–8.3)

## 2017-08-14 LAB — MAGNESIUM: MAGNESIUM: 2.5 mg/dL (ref 1.5–2.5)

## 2017-08-14 NOTE — Telephone Encounter (Signed)
Received call from lab. Calcium 13.6. Dr. Benay Spice notified. Pt is waiting in lobby. As long as she feels OK, will recheck lab in one week- per MD.  Damaris Schooner with pt in lobby, she reports increased fatigue and feeling "winded" with exertion. Pt requests to check HGB.  CBC checked, HGB 10.4. Per Dr. Benay Spice, we can work in to be seen today if pt wants.  Pt and husband declined visit today, they want to follow up in one week as scheduled. Pt stated "Today is just a bad day." She was busy with family over the weekend and was able to manage. Encouraged her to call office if symptoms worsen, will work her in. She and husband voiced understanding.

## 2017-08-20 ENCOUNTER — Other Ambulatory Visit: Payer: Self-pay | Admitting: Oncology

## 2017-08-21 ENCOUNTER — Encounter: Payer: Self-pay | Admitting: Nurse Practitioner

## 2017-08-21 ENCOUNTER — Other Ambulatory Visit (HOSPITAL_BASED_OUTPATIENT_CLINIC_OR_DEPARTMENT_OTHER): Payer: Medicare Other

## 2017-08-21 ENCOUNTER — Ambulatory Visit (HOSPITAL_BASED_OUTPATIENT_CLINIC_OR_DEPARTMENT_OTHER): Payer: Medicare Other | Admitting: Nurse Practitioner

## 2017-08-21 ENCOUNTER — Ambulatory Visit (HOSPITAL_BASED_OUTPATIENT_CLINIC_OR_DEPARTMENT_OTHER): Payer: Medicare Other

## 2017-08-21 ENCOUNTER — Ambulatory Visit: Payer: Medicare Other

## 2017-08-21 VITALS — BP 117/80 | HR 99 | Temp 98.0°F | Resp 17 | Ht 65.0 in | Wt 161.8 lb

## 2017-08-21 DIAGNOSIS — C50919 Malignant neoplasm of unspecified site of unspecified female breast: Secondary | ICD-10-CM | POA: Diagnosis not present

## 2017-08-21 DIAGNOSIS — C7951 Secondary malignant neoplasm of bone: Secondary | ICD-10-CM

## 2017-08-21 DIAGNOSIS — Z5189 Encounter for other specified aftercare: Secondary | ICD-10-CM

## 2017-08-21 DIAGNOSIS — Z9889 Other specified postprocedural states: Secondary | ICD-10-CM

## 2017-08-21 DIAGNOSIS — Z5111 Encounter for antineoplastic chemotherapy: Secondary | ICD-10-CM | POA: Diagnosis not present

## 2017-08-21 LAB — CBC WITH DIFFERENTIAL/PLATELET
BASO%: 0 % (ref 0.0–2.0)
BASOS ABS: 0 10*3/uL (ref 0.0–0.1)
EOS%: 0.5 % (ref 0.0–7.0)
Eosinophils Absolute: 0 10*3/uL (ref 0.0–0.5)
HCT: 30.4 % — ABNORMAL LOW (ref 34.8–46.6)
HEMOGLOBIN: 9.9 g/dL — AB (ref 11.6–15.9)
LYMPH#: 0.9 10*3/uL (ref 0.9–3.3)
LYMPH%: 24.5 % (ref 14.0–49.7)
MCH: 32.6 pg (ref 25.1–34.0)
MCHC: 32.6 g/dL (ref 31.5–36.0)
MCV: 100 fL (ref 79.5–101.0)
MONO#: 0.4 10*3/uL (ref 0.1–0.9)
MONO%: 11.4 % (ref 0.0–14.0)
NEUT#: 2.3 10*3/uL (ref 1.5–6.5)
NEUT%: 63.6 % (ref 38.4–76.8)
Platelets: 142 10*3/uL — ABNORMAL LOW (ref 145–400)
RBC: 3.04 10*6/uL — ABNORMAL LOW (ref 3.70–5.45)
RDW: 16.5 % — AB (ref 11.2–14.5)
WBC: 3.7 10*3/uL — ABNORMAL LOW (ref 3.9–10.3)

## 2017-08-21 LAB — COMPREHENSIVE METABOLIC PANEL
ALT: 68 U/L — AB (ref 0–55)
AST: 120 U/L — AB (ref 5–34)
Albumin: 3.9 g/dL (ref 3.5–5.0)
Alkaline Phosphatase: 171 U/L — ABNORMAL HIGH (ref 40–150)
Anion Gap: 8 mEq/L (ref 3–11)
BUN: 9.7 mg/dL (ref 7.0–26.0)
CHLORIDE: 108 meq/L (ref 98–109)
CO2: 23 mEq/L (ref 22–29)
CREATININE: 0.8 mg/dL (ref 0.6–1.1)
Calcium: 12.9 mg/dL — ABNORMAL HIGH (ref 8.4–10.4)
EGFR: >60 mL/min/{1.73_m2} (ref >60)
GLUCOSE: 88 mg/dL (ref 70–140)
POTASSIUM: 4.1 meq/L (ref 3.5–5.1)
SODIUM: 138 meq/L (ref 136–145)
Total Bilirubin: 0.86 mg/dL (ref 0.20–1.20)
Total Protein: 7.6 g/dL (ref 6.4–8.3)

## 2017-08-21 MED ORDER — ONDANSETRON HCL 8 MG PO TABS
ORAL_TABLET | ORAL | Status: AC
  Filled 2017-08-21: qty 1

## 2017-08-21 MED ORDER — PACLITAXEL CHEMO INJECTION 300 MG/50ML
80.0000 mg/m2 | Freq: Once | INTRAVENOUS | Status: AC
  Administered 2017-08-21: 150 mg via INTRAVENOUS
  Filled 2017-08-21: qty 25

## 2017-08-21 MED ORDER — ALTEPLASE 2 MG IJ SOLR
2.0000 mg | Freq: Once | INTRAMUSCULAR | Status: AC | PRN
  Administered 2017-08-21: 2 mg
  Filled 2017-08-21: qty 2

## 2017-08-21 MED ORDER — DEXAMETHASONE SODIUM PHOSPHATE 10 MG/ML IJ SOLN
10.0000 mg | Freq: Once | INTRAMUSCULAR | Status: AC
  Administered 2017-08-21: 10 mg via INTRAVENOUS

## 2017-08-21 MED ORDER — SODIUM CHLORIDE 0.9% FLUSH
10.0000 mL | INTRAVENOUS | Status: DC | PRN
  Administered 2017-08-21: 10 mL
  Filled 2017-08-21: qty 10

## 2017-08-21 MED ORDER — ALTEPLASE 2 MG IJ SOLR
2.0000 mg | Freq: Once | INTRAMUSCULAR | Status: AC
  Administered 2017-08-21: 2 mg
  Filled 2017-08-21: qty 2

## 2017-08-21 MED ORDER — DEXAMETHASONE SODIUM PHOSPHATE 10 MG/ML IJ SOLN
INTRAMUSCULAR | Status: AC
  Filled 2017-08-21: qty 1

## 2017-08-21 MED ORDER — SODIUM CHLORIDE 0.9 % IV SOLN
186.8000 mg | Freq: Once | INTRAVENOUS | Status: AC
  Administered 2017-08-21: 190 mg via INTRAVENOUS
  Filled 2017-08-21: qty 19

## 2017-08-21 MED ORDER — ONDANSETRON HCL 8 MG PO TABS
8.0000 mg | ORAL_TABLET | Freq: Once | ORAL | Status: AC
  Administered 2017-08-21: 8 mg via ORAL

## 2017-08-21 MED ORDER — DIPHENHYDRAMINE HCL 50 MG/ML IJ SOLN
25.0000 mg | Freq: Once | INTRAMUSCULAR | Status: AC
  Administered 2017-08-21: 25 mg via INTRAVENOUS

## 2017-08-21 MED ORDER — DIPHENHYDRAMINE HCL 50 MG/ML IJ SOLN
INTRAMUSCULAR | Status: AC
  Filled 2017-08-21: qty 1

## 2017-08-21 MED ORDER — FAMOTIDINE IN NACL 20-0.9 MG/50ML-% IV SOLN
INTRAVENOUS | Status: AC
  Filled 2017-08-21: qty 50

## 2017-08-21 MED ORDER — HEPARIN SOD (PORK) LOCK FLUSH 100 UNIT/ML IV SOLN
500.0000 [IU] | Freq: Once | INTRAVENOUS | Status: AC | PRN
  Administered 2017-08-21: 500 [IU]
  Filled 2017-08-21: qty 5

## 2017-08-21 MED ORDER — FAMOTIDINE IN NACL 20-0.9 MG/50ML-% IV SOLN
20.0000 mg | Freq: Once | INTRAVENOUS | Status: AC
  Administered 2017-08-21: 20 mg via INTRAVENOUS

## 2017-08-21 MED ORDER — SODIUM CHLORIDE 0.9% FLUSH
10.0000 mL | Freq: Once | INTRAVENOUS | Status: AC
  Administered 2017-08-21: 10 mL
  Filled 2017-08-21: qty 10

## 2017-08-21 MED ORDER — ONDANSETRON HCL 8 MG PO TABS: 8.0000 mg | ORAL_TABLET | Freq: Three times a day (TID) | ORAL | 2 refills | Status: DC | PRN

## 2017-08-21 MED ORDER — POTASSIUM CHLORIDE CRYS ER 20 MEQ PO TBCR: 20.0000 meq | EXTENDED_RELEASE_TABLET | Freq: Two times a day (BID) | ORAL | 1 refills | Status: DC

## 2017-08-21 MED ORDER — SODIUM CHLORIDE 0.9 % IV SOLN
Freq: Once | INTRAVENOUS | Status: AC
  Administered 2017-08-21: 14:00:00 via INTRAVENOUS

## 2017-08-21 NOTE — Patient Instructions (Signed)
[place] Discharge Instructions for Patients Receiving Chemotherapy  Today you received the following chemotherapy agents Carboplatin; Taxol  To help prevent nausea and vomiting after your treatment, we encourage you to take your nausea medication as directed   If you develop nausea and vomiting that is not controlled by your nausea medication, call the clinic.   BELOW ARE SYMPTOMS THAT SHOULD BE REPORTED IMMEDIATELY:  *FEVER GREATER THAN 100.5 F  *CHILLS WITH OR WITHOUT FEVER  NAUSEA AND VOMITING THAT IS NOT CONTROLLED WITH YOUR NAUSEA MEDICATION  *UNUSUAL SHORTNESS OF BREATH  *UNUSUAL BRUISING OR BLEEDING  TENDERNESS IN MOUTH AND THROAT WITH OR WITHOUT PRESENCE OF ULCERS  *URINARY PROBLEMS  *BOWEL PROBLEMS  UNUSUAL RASH Items with * indicate a potential emergency and should be followed up as soon as possible.  Feel free to call the clinic should you have any questions or concerns. The clinic phone number is [phone].  Please show the [facility] at check-in to the Emergency Department and triage nurse.

## 2017-08-21 NOTE — Progress Notes (Signed)
Patient received two doses of Cathflo into her port. Unable to obtain blood return after 2 hours of both doses. Dr. Benay Spice notified. Per Dr. Benay Spice, patient okay to be discharged home and patient will be contacted about further interventions. Patient aware and verbalized understanding.

## 2017-08-21 NOTE — Progress Notes (Signed)
Per Ned Card, NP okay to treat with 08/21/17 AST.

## 2017-08-21 NOTE — Progress Notes (Addendum)
[place] OFFICE PROGRESS NOTE   Diagnosis: Breast cancer  INTERVAL HISTORY:   Stacy Horn returns as scheduled.  She completed cycle 2-day 15 Taxol/carboplatin 08/07/2017.  She has mild intermittent nausea.  No mouth sores.  Bowels alternate diarrhea and constipation.  No new neuropathy symptoms.  She noted some joint pain following the most recent treatment.  The dyspnea on exertion she was experiencing last week has improved.  No chest pain.  No leg swelling or calf pain.  She continues to have pain at the posterior mid back near where the bra breast.  Right shoulder pain is better since the injection.  She continues to have pain at the left upper arm.  Objective:  Vital signs in last 24 hours:  Blood pressure 117/80, pulse 99, temperature 98 F (36.7 C), temperature source Oral, resp. rate 17, height '5\' 5"'  (1.651 m), weight 161 lb 12.8 oz (73.4 kg), last menstrual period 10/08/2014, SpO2 100 %.    HEENT: No thrush or ulcers. Resp: Lungs clear bilaterally. Cardio: Regular rate and rhythm. GI: Abdomen soft and nontender.  No hepatomegaly. Vascular: No leg edema.  Port-A-Cath without erythema.  Lab Results:  Lab Results  Component Value Date   WBC 3.7 (L) 08/21/2017   HGB 9.9 (L) 08/21/2017   HCT 30.4 (L) 08/21/2017   MCV 100.0 08/21/2017   PLT 142 (L) 08/21/2017   NEUTROABS 2.3 08/21/2017    Imaging:  No results found.  Medications: I have reviewed the patient's current medications.  Assessment/Plan: 1. Multicentric invasive lobular carcinoma of the right breast diagnosed in December of 1999, a right mastectomy followed by adjuvant [facility] chemotherapy, 5 years of tamoxifen, and 5 years of Femara. The Femara was completed in June of 2010. The right breast mass excision in January 2000 was ER positive, PR positive, and Stacy-2 negative  2. Pain at the left groin area with a CT of the pelvis on 02/07/12 confirming a destructive lytic lesion at the left pubic  symphysis, status post a CT-guided biopsy on 02/16/12 confirming metastatic carcinoma, focally ER positive .  -PET scan 03/07/2012 confirmed multiple hypermetabolic bone metastases with no other evidence of metastatic disease  -Status post palliative radiation to the pelvis beginning on 03/12/2012  -Initiation of Arimidex on 03/27/2012 -initiation of every three-month xgeva on 04/04/2012  -PET scan September 14 [id number] new hypermetabolic bone lesions  -Initiation of Faslodex 09/27/2012  -Restaging PET scan 04/08/2013 with evidence of disease progression in the bones  -Initiation of Aromasin/afinitor 05/29/2013  -Iliac biopsy at [facility] 05/22/2013 confirming Stacy-2 negative metastatic breast  -PET scan 11/18/2013 with a decrease in hypermetabolic activity  -PET scan 10/08/2014 with progressive diffuse hypermetabolic bone metastases -Initiation of Palbociclib 10/20/2014; Faslodex 10/23/2014. -Palbociclib placed on hold beginning 11/07/2014 due to neutropenia. -Palbociclib resumed 11/27/2014 on an every other day schedule -Palbociclib resumed 01/04/2015 at a reduced dose of 100 mg daily for 21 days. -Palbociclib beginning 02/12/2015 at a dose of 100 mg daily for 14 days on/14 days off. -PET scan 05/18/2015 with diffuse hypermetabolic bone lesions, increased metabolic activity compared to the PET scan from January 2016 -Palbociclib resumed 06/07/2015, Faslodex continued 06/12/2015 -PET scan 09/22/2015 with diffuse hypermetabolic bone lesions, increased metabolic activity compared to the PET scan 05/18/2015 -Continuation of palbociclib and Faslodex -palbociclib and Faslodex discontinued April 2017 secondary to a C7 spinous process fracture and hypercalcemia -PET scan 44/11/4740-VZDGLOV hypermetabolic bone metastases, with increased metabolic activity interpreted as consistent with disease progression - Xeloda (7 days on/7 days  off) started 02/28/2016 -PET scan 08/31/2016-marked improvement  in hypermetabolic bone activity, no extra osseous disease -Xeloda continued -PET scan 06/05/2017-progression of bone metastase including a T3 lesion with paraspinal soft tissue, destructive lesion at T8 and progressive lesions at the shoulders, ribs, sternum, and pelvis -Xeloda discontinued 06/08/2017 -Initiation of weekly Taxol 06/26/2017, carboplatin added with week 3 07/10/2017 3. BRCA1 variant felt to be a polymorphism  4. Pain/tenderness at the left low anterolateral chest wall-she completed palliative radiation on 05/28/2012 with improvement in the pain. She was also treated with radiation to the thoracic spine.  5. Anemia/leukopenia-likely secondary to breast cancer involving the bone marrow and radiation /afinitor -the anemia is improved  6. History of mildly elevated liver enzymes-? Related to afinitor,? Secondary to metastatic breast cancer 7. Neutropenia secondary to Palbociclib. Improved. 8. Pain at the left shoulder and mid back-a plain x-ray of the left shoulder 06/12/2015 revealed a sclerotic density at the left scapula adjacent to the glenoid, there is metastatic disease involving the humeral head and left scapula on the PET scan 05/18/2015. She completed a course of radiation to the left shoulder on 08/05/2015. 9. Low neck/upper back pain 12/24/2015 with MRI showing a mildly displaced acute-appearing C7 spinous process fracture; widespread osseous metastatic disease; no evidence of epidural tumor or spinal cord compression. - Completed palliative radiation to the cervical spine 02/17/2016 10. Dysphagia/odynophagia secondary to radiation-resolved 11. Xeloda-induced skin rash 12. Hypercalcemia secondary to #1. Status post Zometa 06/08/2017, 06/19/2017, and 07/03/2017.Pamidronate 06/26/2017.Calcitonin 07/03/2017, 07/04/2017, 07/06/2017; improved 07/07/2017; IV fluids and Xgeva 07/07/2017 13.Nondisplaced left humerus fracture- likely a pathologic fracture, followed by  orthopedics   Disposition: Stacy Horn appears stable.  She has completed 2 cycles of weekly Taxol/carboplatin.  Clinically she appears stable.  Plan to proceed with cycle 3 today as scheduled.  We reviewed today's labs.  The liver enzymes are further elevated.  Labs reviewed with pharmacy.  No dose adjustments recommended.  We will repeat a chemistry panel when she returns next week.  We will see Stacy in follow-up in 1 week.  She will contact the office in the interim with any problems.  Patient seen with Dr. Benay Spice.    Ned Card ANP/GNP-BC   08/21/2017  1:31 PM  This was a shared visit with Ned Card.  Stacy Horn has completed 2 cycles of Taxol/carboplatin.  Stacy overall clinical status is unchanged.  She has persistent hypercalcemia, but this has improved compared to when she was here in September.  It is difficult to assess treatment response secondary to the lack of "measurable "disease.  We discussed treatment options with Stacy Horn and Stacy Horn.  We decided to proceed with a third cycle of Taxol/carboplatin beginning today.  Stacy Manson, MD

## 2017-08-21 NOTE — Progress Notes (Signed)
Port-A-Cath accessed per LNP.  No blood return noted. Cath Flo instilled per policy, site marked. Will monitor for blood return.

## 2017-08-24 DIAGNOSIS — M67912 Unspecified disorder of synovium and tendon, left shoulder: Secondary | ICD-10-CM | POA: Diagnosis not present

## 2017-08-27 ENCOUNTER — Other Ambulatory Visit: Payer: Self-pay | Admitting: Oncology

## 2017-08-28 ENCOUNTER — Ambulatory Visit: Payer: Medicare Other | Admitting: Nurse Practitioner

## 2017-08-28 ENCOUNTER — Other Ambulatory Visit: Payer: Medicare Other

## 2017-08-28 ENCOUNTER — Ambulatory Visit: Payer: Medicare Other

## 2017-08-30 ENCOUNTER — Telehealth: Payer: Self-pay | Admitting: *Deleted

## 2017-08-30 NOTE — Telephone Encounter (Signed)
Called pt, she feels OK. Reports she was more fatigued on 12/10 after attending a wedding over the weekend. Informed her the options were to be treated later this week and move future appointments out or come in for lab/ office visit only. Pt declined. She would like to keep 12/17 appointments as scheduled. Instructed her to call office if she needs to be seen sooner. Pt voiced understanding.

## 2017-08-31 ENCOUNTER — Other Ambulatory Visit: Payer: Self-pay | Admitting: *Deleted

## 2017-08-31 DIAGNOSIS — C50919 Malignant neoplasm of unspecified site of unspecified female breast: Secondary | ICD-10-CM

## 2017-08-31 DIAGNOSIS — C7951 Secondary malignant neoplasm of bone: Secondary | ICD-10-CM

## 2017-08-31 MED ORDER — HYDROCODONE-ACETAMINOPHEN 7.5-325 MG PO TABS: 1.0000 | ORAL_TABLET | ORAL | 0 refills | Status: DC | PRN

## 2017-09-03 ENCOUNTER — Other Ambulatory Visit: Payer: Self-pay | Admitting: Oncology

## 2017-09-04 ENCOUNTER — Other Ambulatory Visit (HOSPITAL_BASED_OUTPATIENT_CLINIC_OR_DEPARTMENT_OTHER): Payer: Medicare Other

## 2017-09-04 ENCOUNTER — Telehealth: Payer: Self-pay | Admitting: Oncology

## 2017-09-04 ENCOUNTER — Ambulatory Visit: Payer: Medicare Other

## 2017-09-04 ENCOUNTER — Ambulatory Visit (HOSPITAL_BASED_OUTPATIENT_CLINIC_OR_DEPARTMENT_OTHER): Payer: Medicare Other

## 2017-09-04 ENCOUNTER — Ambulatory Visit (HOSPITAL_BASED_OUTPATIENT_CLINIC_OR_DEPARTMENT_OTHER): Payer: Medicare Other | Admitting: Oncology

## 2017-09-04 VITALS — BP 124/74 | HR 110 | Temp 97.5°F | Resp 18 | Ht 65.0 in | Wt 159.3 lb

## 2017-09-04 VITALS — HR 97 | Temp 98.3°F

## 2017-09-04 DIAGNOSIS — C50919 Malignant neoplasm of unspecified site of unspecified female breast: Secondary | ICD-10-CM

## 2017-09-04 DIAGNOSIS — C7951 Secondary malignant neoplasm of bone: Secondary | ICD-10-CM

## 2017-09-04 DIAGNOSIS — Z9889 Other specified postprocedural states: Secondary | ICD-10-CM

## 2017-09-04 DIAGNOSIS — Z5111 Encounter for antineoplastic chemotherapy: Secondary | ICD-10-CM

## 2017-09-04 LAB — COMPREHENSIVE METABOLIC PANEL
ALT: 52 U/L (ref 0–55)
AST: 103 U/L — AB (ref 5–34)
Albumin: 3.7 g/dL (ref 3.5–5.0)
Alkaline Phosphatase: 177 U/L — ABNORMAL HIGH (ref 40–150)
Anion Gap: 9 mEq/L (ref 3–11)
BUN: 12 mg/dL (ref 7.0–26.0)
CHLORIDE: 109 meq/L (ref 98–109)
CO2: 20 meq/L — AB (ref 22–29)
CREATININE: 0.7 mg/dL (ref 0.6–1.1)
Calcium: 13.3 mg/dL (ref 8.4–10.4)
EGFR: >60 mL/min/{1.73_m2} (ref >60)
GLUCOSE: 96 mg/dL (ref 70–140)
POTASSIUM: 3.7 meq/L (ref 3.5–5.1)
SODIUM: 138 meq/L (ref 136–145)
Total Bilirubin: 0.62 mg/dL (ref 0.20–1.20)
Total Protein: 7.4 g/dL (ref 6.4–8.3)

## 2017-09-04 LAB — CBC WITH DIFFERENTIAL/PLATELET
BASO%: 0.3 % (ref 0.0–2.0)
BASOS ABS: 0 10*3/uL (ref 0.0–0.1)
EOS ABS: 0 10*3/uL (ref 0.0–0.5)
EOS%: 0.3 % (ref 0.0–7.0)
HCT: 28.8 % — ABNORMAL LOW (ref 34.8–46.6)
HGB: 9.4 g/dL — ABNORMAL LOW (ref 11.6–15.9)
LYMPH%: 23.8 % (ref 14.0–49.7)
MCH: 31.9 pg (ref 25.1–34.0)
MCHC: 32.6 g/dL (ref 31.5–36.0)
MCV: 97.6 fL (ref 79.5–101.0)
MONO#: 0.5 10*3/uL (ref 0.1–0.9)
MONO%: 11.4 % (ref 0.0–14.0)
NEUT#: 2.5 10*3/uL (ref 1.5–6.5)
NEUT%: 64.2 % (ref 38.4–76.8)
PLATELETS: 187 10*3/uL (ref 145–400)
RBC: 2.95 10*6/uL — AB (ref 3.70–5.45)
RDW: 16.4 % — ABNORMAL HIGH (ref 11.2–14.5)
WBC: 4 10*3/uL (ref 3.9–10.3)
lymph#: 0.9 10*3/uL (ref 0.9–3.3)

## 2017-09-04 LAB — CEA (IN HOUSE-CHCC)

## 2017-09-04 MED ORDER — SODIUM CHLORIDE 0.9 % IV SOLN
186.8000 mg | Freq: Once | INTRAVENOUS | Status: AC
  Administered 2017-09-04: 190 mg via INTRAVENOUS
  Filled 2017-09-04: qty 19

## 2017-09-04 MED ORDER — DEXAMETHASONE SODIUM PHOSPHATE 10 MG/ML IJ SOLN
10.0000 mg | Freq: Once | INTRAMUSCULAR | Status: AC
  Administered 2017-09-04: 10 mg via INTRAVENOUS

## 2017-09-04 MED ORDER — FAMOTIDINE IN NACL 20-0.9 MG/50ML-% IV SOLN
INTRAVENOUS | Status: AC
  Filled 2017-09-04: qty 50

## 2017-09-04 MED ORDER — HEPARIN SOD (PORK) LOCK FLUSH 100 UNIT/ML IV SOLN
500.0000 [IU] | Freq: Once | INTRAVENOUS | Status: AC | PRN
  Administered 2017-09-04: 500 [IU]
  Filled 2017-09-04: qty 5

## 2017-09-04 MED ORDER — DEXAMETHASONE SODIUM PHOSPHATE 10 MG/ML IJ SOLN
INTRAMUSCULAR | Status: AC
  Filled 2017-09-04: qty 1

## 2017-09-04 MED ORDER — DIPHENHYDRAMINE HCL 50 MG/ML IJ SOLN
INTRAMUSCULAR | Status: AC
  Filled 2017-09-04: qty 1

## 2017-09-04 MED ORDER — SODIUM CHLORIDE 0.9 % IV SOLN
80.0000 mg/m2 | Freq: Once | INTRAVENOUS | Status: AC
  Administered 2017-09-04: 150 mg via INTRAVENOUS
  Filled 2017-09-04: qty 25

## 2017-09-04 MED ORDER — FAMOTIDINE IN NACL 20-0.9 MG/50ML-% IV SOLN
20.0000 mg | Freq: Once | INTRAVENOUS | Status: AC
  Administered 2017-09-04: 20 mg via INTRAVENOUS

## 2017-09-04 MED ORDER — SODIUM CHLORIDE 0.9 % IV SOLN
Freq: Once | INTRAVENOUS | Status: AC
  Administered 2017-09-04: 14:00:00 via INTRAVENOUS

## 2017-09-04 MED ORDER — SODIUM CHLORIDE 0.9% FLUSH
10.0000 mL | INTRAVENOUS | Status: DC | PRN
  Administered 2017-09-04: 10 mL
  Filled 2017-09-04: qty 10

## 2017-09-04 MED ORDER — SODIUM CHLORIDE 0.9% FLUSH
10.0000 mL | Freq: Once | INTRAVENOUS | Status: AC
  Administered 2017-09-04: 10 mL
  Filled 2017-09-04: qty 10

## 2017-09-04 MED ORDER — ONDANSETRON HCL 8 MG PO TABS
ORAL_TABLET | ORAL | Status: AC
  Filled 2017-09-04: qty 1

## 2017-09-04 MED ORDER — DIPHENHYDRAMINE HCL 50 MG/ML IJ SOLN
25.0000 mg | Freq: Once | INTRAMUSCULAR | Status: AC
  Administered 2017-09-04: 25 mg via INTRAVENOUS

## 2017-09-04 MED ORDER — ONDANSETRON HCL 8 MG PO TABS
8.0000 mg | ORAL_TABLET | Freq: Once | ORAL | Status: AC
  Administered 2017-09-04: 8 mg via ORAL

## 2017-09-04 NOTE — Telephone Encounter (Signed)
Scheduled appt per 12/17 los - unable to schedule 1/7 due to availability for MD and APP -  Gave patient AVS and calender per los.

## 2017-09-04 NOTE — Progress Notes (Signed)
[place] OFFICE PROGRESS NOTE   Diagnosis: Breast cancer  INTERVAL HISTORY:   Stacy Horn was last treated with Taxol/carboplatin 08/21/2017.  She missed chemotherapy last week secondary to the snow event.  She denies new peripheral numbness.  Good appetite.  She continues to have pain at the back and left upper arm.  She takes hydrocodone 4 times per day.  Her bowels are moving. She stays in the bed most of the time as this is most comfortable for her.  She is ambulatory and is getting out of the house at times.  Objective:  Vital signs in last 24 hours:  Blood pressure 124/74, pulse (!) 110, temperature (!) 97.5 F (36.4 C), temperature source Oral, resp. rate 18, height '5\' 5"'  (1.651 m), weight 159 lb 4.8 oz (72.3 kg), last menstrual period 10/08/2014, SpO2 100 %.    HEENT: No thrush or ulcers Resp: Lungs clear bilaterally Cardio: Regular rate and rhythm GI: No hepatosplenomegaly, nontender Vascular: No leg edema    Portacath/PICC-without erythema  Lab Results:  Lab Results  Component Value Date   WBC 4.0 09/04/2017   HGB 9.4 (L) 09/04/2017   HCT 28.8 (L) 09/04/2017   MCV 97.6 09/04/2017   PLT 187 09/04/2017   NEUTROABS 2.5 09/04/2017    CMP     Component Value Date/Time   NA 138 09/04/2017 1017   K 3.7 09/04/2017 1017   CL 107 12/20/2012 0927   CO2 20 (L) 09/04/2017 1017   GLUCOSE 96 09/04/2017 1017   GLUCOSE 89 12/20/2012 0927   BUN 12.0 09/04/2017 1017   CREATININE 0.7 09/04/2017 1017   CALCIUM 13.3 (HH) 09/04/2017 1017   PROT 7.4 09/04/2017 1017   ALBUMIN 3.7 09/04/2017 1017   AST 103 (H) 09/04/2017 1017   ALT 52 09/04/2017 1017   ALKPHOS 177 (H) 09/04/2017 1017   BILITOT 0.62 09/04/2017 1017     Medications: I have reviewed the patient's current medications.   Assessment/Plan: 1. Multicentric invasive lobular carcinoma of the right breast diagnosed in December of 1999, a right mastectomy followed by adjuvant [facility] chemotherapy, 5  years of tamoxifen, and 5 years of Femara. The Femara was completed in June of 2010. The right breast mass excision in January 2000 was ER positive, PR positive, and HER-2 negative  2. Pain at the left groin area with a CT of the pelvis on 02/07/12 confirming a destructive lytic lesion at the left pubic symphysis, status post a CT-guided biopsy on 02/16/12 confirming metastatic carcinoma, focally ER positive .  -PET scan 03/07/2012 confirmed multiple hypermetabolic bone metastases with no other evidence of metastatic disease  -Status post palliative radiation to the pelvis beginning on 03/12/2012  -Initiation of Arimidex on 03/27/2012 -initiation of every three-month xgeva on 04/04/2012  -PET scan September 14 [id number] new hypermetabolic bone lesions  -Initiation of Faslodex 09/27/2012  -Restaging PET scan 04/08/2013 with evidence of disease progression in the bones  -Initiation of Aromasin/afinitor 05/29/2013  -Iliac biopsy at [facility] 05/22/2013 confirming HER-2 negative metastatic breast  -PET scan 11/18/2013 with a decrease in hypermetabolic activity  -PET scan 10/08/2014 with progressive diffuse hypermetabolic bone metastases -Initiation of Palbociclib 10/20/2014; Faslodex 10/23/2014. -Palbociclib placed on hold beginning 11/07/2014 due to neutropenia. -Palbociclib resumed 11/27/2014 on an every other day schedule -Palbociclib resumed 01/04/2015 at a reduced dose of 100 mg daily for 21 days. -Palbociclib beginning 02/12/2015 at a dose of 100 mg daily for 14 days on/14 days off. -PET scan 05/18/2015 with diffuse hypermetabolic bone  lesions, increased metabolic activity compared to the PET scan from January 2016 -Palbociclib resumed 06/07/2015, Faslodex continued 06/12/2015 -PET scan 09/22/2015 with diffuse hypermetabolic bone lesions, increased metabolic activity compared to the PET scan 05/18/2015 -Continuation of palbociclib and Faslodex -palbociclib and Faslodex discontinued April  2017 secondary to a C7 spinous process fracture and hypercalcemia -PET scan 12/45/8099-IPJASNK hypermetabolic bone metastases, with increased metabolic activity interpreted as consistent with disease progression - Xeloda (7 days on/7 days off) started 02/28/2016 -PET scan 08/31/2016-marked improvement in hypermetabolic bone activity, no extra osseous disease -Xeloda continued -PET scan 06/05/2017-progression of bone metastase including a T3 lesion with paraspinal soft tissue, destructive lesion at T8 and progressive lesions at the shoulders, ribs, sternum, and pelvis -Xeloda discontinued 06/08/2017 -Initiation of weekly Taxol 06/26/2017, carboplatin added with week 3 07/10/2017 3. BRCA1 variant felt to be a polymorphism  4. Pain/tenderness at the left low anterolateral chest wall-she completed palliative radiation on 05/28/2012 with improvement in the pain. She was also treated with radiation to the thoracic spine.  5. Anemia/leukopenia-likely secondary to breast cancer involving the bone marrow and radiation /afinitor -the anemia is improved  6. History of mildly elevated liver enzymes-? Related to afinitor,? Secondary to metastatic breast cancer 7. Neutropenia secondary to Palbociclib. Improved. 8. Pain at the left shoulder and mid back-a plain x-ray of the left shoulder 06/12/2015 revealed a sclerotic density at the left scapula adjacent to the glenoid, there is metastatic disease involving the humeral head and left scapula on the PET scan 05/18/2015. She completed a course of radiation to the left shoulder on 08/05/2015. 9. Low neck/upper back pain 12/24/2015 with MRI showing a mildly displaced acute-appearing C7 spinous process fracture; widespread osseous metastatic disease; no evidence of epidural tumor or spinal cord compression. - Completed palliative radiation to the cervical spine 02/17/2016 10. Dysphagia/odynophagia secondary to radiation-resolved 11. Xeloda-induced skin rash 12.  Hypercalcemia secondary to #1. Status post Zometa 06/08/2017, 06/19/2017, and 07/03/2017.Pamidronate 06/26/2017.Calcitonin 07/03/2017, 07/04/2017, 07/06/2017; improved 07/07/2017; IV fluids and Xgeva 07/07/2017 13.Nondisplaced left humerus fracture-likely a pathologic fracture, followed by orthopedics     Disposition: Ms. Ishee appears unchanged.  She has stable pain.  Her overall performance status appears slightly improved over the past few weeks.  The plan is to continue Taxol/carboplatin.  She will be scheduled for a restaging CT scan in late January.  Ms. Laye will complete another cycle of chemotherapy today and again on 09/11/2017.  She will be scheduled for an office visit and chemotherapy 09/25/2016.  The hypercalcemia persists and is stable.  She is asymptomatic from the hypercalcemia.  25 minutes were spent with the patient today.  The majority of the time was used for counseling and coordination of care.  [name], MD  09/04/2017  12:33 PM

## 2017-09-05 LAB — CANCER ANTIGEN 27.29: CAN 27.29: 43.3 U/mL — AB (ref 0.0–38.6)

## 2017-09-08 ENCOUNTER — Telehealth: Payer: Self-pay | Admitting: Medical Oncology

## 2017-09-08 NOTE — Telephone Encounter (Signed)
Requests refill to pick up Monday.

## 2017-09-10 ENCOUNTER — Other Ambulatory Visit: Payer: Self-pay | Admitting: Oncology

## 2017-09-11 ENCOUNTER — Other Ambulatory Visit (HOSPITAL_BASED_OUTPATIENT_CLINIC_OR_DEPARTMENT_OTHER): Payer: Medicare Other

## 2017-09-11 ENCOUNTER — Ambulatory Visit (HOSPITAL_BASED_OUTPATIENT_CLINIC_OR_DEPARTMENT_OTHER): Payer: Medicare Other

## 2017-09-11 ENCOUNTER — Other Ambulatory Visit: Payer: Self-pay | Admitting: *Deleted

## 2017-09-11 VITALS — BP 106/67 | HR 98 | Temp 98.9°F | Resp 18

## 2017-09-11 DIAGNOSIS — C7951 Secondary malignant neoplasm of bone: Secondary | ICD-10-CM

## 2017-09-11 DIAGNOSIS — Z5111 Encounter for antineoplastic chemotherapy: Secondary | ICD-10-CM | POA: Diagnosis not present

## 2017-09-11 DIAGNOSIS — C50919 Malignant neoplasm of unspecified site of unspecified female breast: Secondary | ICD-10-CM | POA: Diagnosis not present

## 2017-09-11 LAB — CBC WITH DIFFERENTIAL/PLATELET
BASO%: 0.4 % (ref 0.0–2.0)
Basophils Absolute: 0 10*3/uL (ref 0.0–0.1)
EOS ABS: 0 10*3/uL (ref 0.0–0.5)
EOS%: 0.4 % (ref 0.0–7.0)
HCT: 26.8 % — ABNORMAL LOW (ref 34.8–46.6)
HGB: 8.8 g/dL — ABNORMAL LOW (ref 11.6–15.9)
LYMPH%: 25.1 % (ref 14.0–49.7)
MCH: 31.5 pg (ref 25.1–34.0)
MCHC: 32.8 g/dL (ref 31.5–36.0)
MCV: 96.1 fL (ref 79.5–101.0)
MONO#: 0.2 10*3/uL (ref 0.1–0.9)
MONO%: 8.7 % (ref 0.0–14.0)
NEUT%: 65.4 % (ref 38.4–76.8)
NEUTROS ABS: 1.7 10*3/uL (ref 1.5–6.5)
NRBC: 6 % — AB (ref 0–0)
PLATELETS: 198 10*3/uL (ref 145–400)
RBC: 2.79 10*6/uL — AB (ref 3.70–5.45)
RDW: 16 % — AB (ref 11.2–14.5)
WBC: 2.6 10*3/uL — AB (ref 3.9–10.3)
lymph#: 0.7 10*3/uL — ABNORMAL LOW (ref 0.9–3.3)

## 2017-09-11 LAB — COMPREHENSIVE METABOLIC PANEL
ALT: 38 U/L (ref 0–55)
ANION GAP: 12 meq/L — AB (ref 3–11)
AST: 53 U/L — AB (ref 5–34)
Albumin: 3.7 g/dL (ref 3.5–5.0)
Alkaline Phosphatase: 124 U/L (ref 40–150)
BILIRUBIN TOTAL: 0.39 mg/dL (ref 0.20–1.20)
BUN: 8.7 mg/dL (ref 7.0–26.0)
CHLORIDE: 110 meq/L — AB (ref 98–109)
CO2: 19 meq/L — AB (ref 22–29)
CREATININE: 0.8 mg/dL (ref 0.6–1.1)
Calcium: 12.4 mg/dL — ABNORMAL HIGH (ref 8.4–10.4)
EGFR: >60 mL/min/{1.73_m2} (ref >60)
GLUCOSE: 136 mg/dL (ref 70–140)
Potassium: 3.1 mEq/L — ABNORMAL LOW (ref 3.5–5.1)
SODIUM: 141 meq/L (ref 136–145)
TOTAL PROTEIN: 7 g/dL (ref 6.4–8.3)

## 2017-09-11 LAB — MAGNESIUM: Magnesium: 2.5 mg/dl (ref 1.5–2.5)

## 2017-09-11 LAB — TECHNOLOGIST REVIEW

## 2017-09-11 MED ORDER — DEXAMETHASONE SODIUM PHOSPHATE 10 MG/ML IJ SOLN
10.0000 mg | Freq: Once | INTRAMUSCULAR | Status: AC
  Administered 2017-09-11: 10 mg via INTRAVENOUS

## 2017-09-11 MED ORDER — SODIUM CHLORIDE 0.9 % IV SOLN
Freq: Once | INTRAVENOUS | Status: AC
  Administered 2017-09-11: 12:00:00 via INTRAVENOUS

## 2017-09-11 MED ORDER — DIPHENHYDRAMINE HCL 50 MG/ML IJ SOLN
25.0000 mg | Freq: Once | INTRAMUSCULAR | Status: AC
  Administered 2017-09-11: 25 mg via INTRAVENOUS

## 2017-09-11 MED ORDER — FAMOTIDINE IN NACL 20-0.9 MG/50ML-% IV SOLN
20.0000 mg | Freq: Once | INTRAVENOUS | Status: AC
  Administered 2017-09-11: 20 mg via INTRAVENOUS

## 2017-09-11 MED ORDER — DIPHENHYDRAMINE HCL 50 MG/ML IJ SOLN
INTRAMUSCULAR | Status: AC
Start: 2017-09-11 — End: 2017-09-11
  Filled 2017-09-11: qty 1

## 2017-09-11 MED ORDER — HYDROCODONE-ACETAMINOPHEN 7.5-325 MG PO TABS: 1.0000 | ORAL_TABLET | ORAL | 0 refills | Status: DC | PRN

## 2017-09-11 MED ORDER — ONDANSETRON HCL 8 MG PO TABS
ORAL_TABLET | ORAL | Status: AC
  Filled 2017-09-11: qty 1

## 2017-09-11 MED ORDER — DEXAMETHASONE SODIUM PHOSPHATE 10 MG/ML IJ SOLN
INTRAMUSCULAR | Status: AC
  Filled 2017-09-11: qty 1

## 2017-09-11 MED ORDER — FAMOTIDINE IN NACL 20-0.9 MG/50ML-% IV SOLN
INTRAVENOUS | Status: AC
  Filled 2017-09-11: qty 50

## 2017-09-11 MED ORDER — SODIUM CHLORIDE 0.9% FLUSH
10.0000 mL | INTRAVENOUS | Status: DC | PRN
  Administered 2017-09-11: 10 mL
  Filled 2017-09-11: qty 10

## 2017-09-11 MED ORDER — HEPARIN SOD (PORK) LOCK FLUSH 100 UNIT/ML IV SOLN
500.0000 [IU] | Freq: Once | INTRAVENOUS | Status: AC | PRN
  Administered 2017-09-11: 500 [IU]
  Filled 2017-09-11: qty 5

## 2017-09-11 MED ORDER — SODIUM CHLORIDE 0.9 % IV SOLN
186.8000 mg | Freq: Once | INTRAVENOUS | Status: AC
  Administered 2017-09-11: 190 mg via INTRAVENOUS
  Filled 2017-09-11: qty 19

## 2017-09-11 MED ORDER — ONDANSETRON HCL 8 MG PO TABS
8.0000 mg | ORAL_TABLET | Freq: Once | ORAL | Status: AC
  Administered 2017-09-11: 8 mg via ORAL

## 2017-09-11 MED ORDER — SODIUM CHLORIDE 0.9 % IV SOLN
80.0000 mg/m2 | Freq: Once | INTRAVENOUS | Status: AC
  Administered 2017-09-11: 150 mg via INTRAVENOUS
  Filled 2017-09-11: qty 25

## 2017-09-11 NOTE — Patient Instructions (Signed)
[facility] Discharge Instructions for Patients Receiving Chemotherapy  Today you received the following chemotherapy agents Carboplatin and taxol  To help prevent nausea and vomiting after your treatment, we encourage you to take your nausea medication as directed If you develop nausea and vomiting that is not controlled by your nausea medication, call the clinic.   BELOW ARE SYMPTOMS THAT SHOULD BE REPORTED IMMEDIATELY:  *FEVER GREATER THAN 100.5 F  *CHILLS WITH OR WITHOUT FEVER  NAUSEA AND VOMITING THAT IS NOT CONTROLLED WITH YOUR NAUSEA MEDICATION  *UNUSUAL SHORTNESS OF BREATH  *UNUSUAL BRUISING OR BLEEDING  TENDERNESS IN MOUTH AND THROAT WITH OR WITHOUT PRESENCE OF ULCERS  *URINARY PROBLEMS  *BOWEL PROBLEMS  UNUSUAL RASH Items with * indicate a potential emergency and should be followed up as soon as possible.  Feel free to call the clinic should you have any questions or concerns. The clinic phone number is [phone].  Please show the CHEMO ALERT CARD at check-in to the Emergency Department and triage nurse.   

## 2017-09-11 NOTE — Telephone Encounter (Signed)
Message from pt requesting Hydrocodone refill. Script will be left in prescription book for pick up during today's appts.

## 2017-09-13 ENCOUNTER — Telehealth: Payer: Self-pay

## 2017-09-13 NOTE — Telephone Encounter (Signed)
Notified of message below. Patient to pick up potassium tablets today.

## 2017-09-13 NOTE — Telephone Encounter (Signed)
-----   Message from Ladell Pier, MD sent at 09/11/2017  2:06 PM EST ----- Please call patient, ca++ is better, potassium is low, be sure she is taking potassium

## 2017-09-21 DIAGNOSIS — M67912 Unspecified disorder of synovium and tendon, left shoulder: Secondary | ICD-10-CM | POA: Diagnosis not present

## 2017-09-23 ENCOUNTER — Other Ambulatory Visit: Payer: Self-pay | Admitting: Oncology

## 2017-09-26 ENCOUNTER — Inpatient Hospital Stay: Payer: Medicare Other | Attending: Nurse Practitioner | Admitting: Nurse Practitioner

## 2017-09-26 ENCOUNTER — Inpatient Hospital Stay: Payer: Medicare Other

## 2017-09-26 ENCOUNTER — Telehealth: Payer: Self-pay | Admitting: Nurse Practitioner

## 2017-09-26 VITALS — BP 124/73 | HR 100 | Temp 98.4°F | Resp 20 | Ht 65.0 in | Wt 151.7 lb

## 2017-09-26 DIAGNOSIS — R945 Abnormal results of liver function studies: Secondary | ICD-10-CM | POA: Diagnosis not present

## 2017-09-26 DIAGNOSIS — R5381 Other malaise: Secondary | ICD-10-CM | POA: Insufficient documentation

## 2017-09-26 DIAGNOSIS — C7951 Secondary malignant neoplasm of bone: Principal | ICD-10-CM

## 2017-09-26 DIAGNOSIS — Z5111 Encounter for antineoplastic chemotherapy: Secondary | ICD-10-CM | POA: Insufficient documentation

## 2017-09-26 DIAGNOSIS — D649 Anemia, unspecified: Secondary | ICD-10-CM | POA: Insufficient documentation

## 2017-09-26 DIAGNOSIS — Z9889 Other specified postprocedural states: Secondary | ICD-10-CM

## 2017-09-26 DIAGNOSIS — Z9223 Personal history of estrogen therapy: Secondary | ICD-10-CM | POA: Diagnosis not present

## 2017-09-26 DIAGNOSIS — K760 Fatty (change of) liver, not elsewhere classified: Secondary | ICD-10-CM | POA: Diagnosis not present

## 2017-09-26 DIAGNOSIS — C50919 Malignant neoplasm of unspecified site of unspecified female breast: Secondary | ICD-10-CM | POA: Diagnosis not present

## 2017-09-26 DIAGNOSIS — G893 Neoplasm related pain (acute) (chronic): Secondary | ICD-10-CM | POA: Insufficient documentation

## 2017-09-26 DIAGNOSIS — D72829 Elevated white blood cell count, unspecified: Secondary | ICD-10-CM | POA: Diagnosis not present

## 2017-09-26 DIAGNOSIS — Z9011 Acquired absence of right breast and nipple: Secondary | ICD-10-CM | POA: Insufficient documentation

## 2017-09-26 DIAGNOSIS — Z170 Estrogen receptor positive status [ER+]: Secondary | ICD-10-CM | POA: Diagnosis not present

## 2017-09-26 DIAGNOSIS — Z853 Personal history of malignant neoplasm of breast: Secondary | ICD-10-CM

## 2017-09-26 DIAGNOSIS — C50911 Malignant neoplasm of unspecified site of right female breast: Secondary | ICD-10-CM | POA: Diagnosis not present

## 2017-09-26 DIAGNOSIS — Z923 Personal history of irradiation: Secondary | ICD-10-CM | POA: Insufficient documentation

## 2017-09-26 LAB — COMPREHENSIVE METABOLIC PANEL
ALBUMIN: 3.7 g/dL (ref 3.5–5.0)
ALT: 61 U/L — ABNORMAL HIGH (ref 0–55)
ANION GAP: 9 (ref 3–11)
AST: 98 U/L — ABNORMAL HIGH (ref 5–34)
Alkaline Phosphatase: 158 U/L — ABNORMAL HIGH (ref 40–150)
BUN: 12 mg/dL (ref 7–26)
CHLORIDE: 109 mmol/L (ref 98–109)
CO2: 20 mmol/L — AB (ref 22–29)
Calcium: 14.3 mg/dL (ref 8.4–10.4)
Creatinine, Ser: 0.78 mg/dL (ref 0.60–1.10)
GFR calc Af Amer: >60 mL/min (ref >60)
GFR calc non Af Amer: >60 mL/min (ref >60)
GLUCOSE: 121 mg/dL (ref 70–140)
POTASSIUM: 3.8 mmol/L (ref 3.3–4.7)
SODIUM: 138 mmol/L (ref 136–145)
Total Bilirubin: 0.6 mg/dL (ref 0.2–1.2)
Total Protein: 7.7 g/dL (ref 6.4–8.3)

## 2017-09-26 LAB — CBC WITH DIFFERENTIAL/PLATELET
Abs Granulocyte: 2.5 10*3/uL (ref 1.5–6.5)
BASOS ABS: 0 10*3/uL (ref 0.0–0.1)
Basophils Relative: 0 %
Eosinophils Absolute: 0 10*3/uL (ref 0.0–0.5)
Eosinophils Relative: 0 %
HCT: 29.9 % — ABNORMAL LOW (ref 34.8–46.6)
HEMOGLOBIN: 10 g/dL — AB (ref 11.6–15.9)
LYMPHS ABS: 0.7 10*3/uL — AB (ref 0.9–3.3)
LYMPHS PCT: 20 %
MCH: 32.1 pg (ref 25.1–34.0)
MCHC: 33.5 g/dL (ref 31.5–36.0)
MCV: 96 fL (ref 79.5–101.0)
Monocytes Absolute: 0.5 10*3/uL (ref 0.1–0.9)
Monocytes Relative: 12 %
NEUTROS PCT: 68 %
Neutro Abs: 2.5 10*3/uL (ref 1.5–6.5)
Platelets: 193 10*3/uL (ref 145–400)
RBC: 3.12 MIL/uL — ABNORMAL LOW (ref 3.70–5.45)
RDW: 18.7 % — ABNORMAL HIGH (ref 11.2–16.1)
WBC: 3.7 10*3/uL — ABNORMAL LOW (ref 3.9–10.3)

## 2017-09-26 LAB — MAGNESIUM: MAGNESIUM: 2.5 mg/dL (ref 1.5–2.5)

## 2017-09-26 MED ORDER — SODIUM CHLORIDE 0.9% FLUSH
10.0000 mL | INTRAVENOUS | Status: DC | PRN
  Administered 2017-09-26: 10 mL
  Filled 2017-09-26: qty 10

## 2017-09-26 MED ORDER — FAMOTIDINE IN NACL 20-0.9 MG/50ML-% IV SOLN
INTRAVENOUS | Status: AC
  Filled 2017-09-26: qty 50

## 2017-09-26 MED ORDER — DEXAMETHASONE SODIUM PHOSPHATE 10 MG/ML IJ SOLN
INTRAMUSCULAR | Status: AC
  Filled 2017-09-26: qty 1

## 2017-09-26 MED ORDER — ONDANSETRON HCL 8 MG PO TABS
8.0000 mg | ORAL_TABLET | Freq: Once | ORAL | Status: AC
  Administered 2017-09-26: 8 mg via ORAL

## 2017-09-26 MED ORDER — SODIUM CHLORIDE 0.9% FLUSH
10.0000 mL | Freq: Once | INTRAVENOUS | Status: AC
  Administered 2017-09-26: 10 mL
  Filled 2017-09-26: qty 10

## 2017-09-26 MED ORDER — DIPHENHYDRAMINE HCL 50 MG/ML IJ SOLN
25.0000 mg | Freq: Once | INTRAMUSCULAR | Status: AC
  Administered 2017-09-26: 25 mg via INTRAVENOUS

## 2017-09-26 MED ORDER — FAMOTIDINE IN NACL 20-0.9 MG/50ML-% IV SOLN
20.0000 mg | Freq: Once | INTRAVENOUS | Status: AC
  Administered 2017-09-26: 20 mg via INTRAVENOUS

## 2017-09-26 MED ORDER — HEPARIN SOD (PORK) LOCK FLUSH 100 UNIT/ML IV SOLN
500.0000 [IU] | Freq: Once | INTRAVENOUS | Status: AC | PRN
  Administered 2017-09-26: 500 [IU]
  Filled 2017-09-26: qty 5

## 2017-09-26 MED ORDER — SODIUM CHLORIDE 0.9 % IV SOLN
80.0000 mg/m2 | Freq: Once | INTRAVENOUS | Status: AC
  Administered 2017-09-26: 150 mg via INTRAVENOUS
  Filled 2017-09-26: qty 25

## 2017-09-26 MED ORDER — SODIUM CHLORIDE 0.9 % IV SOLN
Freq: Once | INTRAVENOUS | Status: AC
  Administered 2017-09-26: 12:00:00 via INTRAVENOUS

## 2017-09-26 MED ORDER — ONDANSETRON HCL 8 MG PO TABS
ORAL_TABLET | ORAL | Status: AC
  Filled 2017-09-26: qty 1

## 2017-09-26 MED ORDER — SODIUM CHLORIDE 0.9 % IV SOLN
186.8000 mg | Freq: Once | INTRAVENOUS | Status: AC
  Administered 2017-09-26: 190 mg via INTRAVENOUS
  Filled 2017-09-26: qty 19

## 2017-09-26 MED ORDER — DENOSUMAB 120 MG/1.7ML ~~LOC~~ SOLN
SUBCUTANEOUS | Status: AC
  Filled 2017-09-26: qty 1.7

## 2017-09-26 MED ORDER — DEXAMETHASONE SODIUM PHOSPHATE 10 MG/ML IJ SOLN
10.0000 mg | Freq: Once | INTRAMUSCULAR | Status: AC
  Administered 2017-09-26: 10 mg via INTRAVENOUS

## 2017-09-26 MED ORDER — HYDROCODONE-ACETAMINOPHEN 7.5-325 MG PO TABS: 1.0000 | ORAL_TABLET | ORAL | 0 refills | Status: DC | PRN

## 2017-09-26 MED ORDER — DIPHENHYDRAMINE HCL 50 MG/ML IJ SOLN
INTRAMUSCULAR | Status: AC
  Filled 2017-09-26: qty 1

## 2017-09-26 MED ORDER — DENOSUMAB 120 MG/1.7ML ~~LOC~~ SOLN
120.0000 mg | Freq: Once | SUBCUTANEOUS | Status: AC
  Administered 2017-09-26: 120 mg via SUBCUTANEOUS

## 2017-09-26 NOTE — Progress Notes (Signed)
Lab reported critical calcium on patient of 14.3. MD made aware.

## 2017-09-26 NOTE — Progress Notes (Signed)
Okay to treat today with abnormal lab results, per Dr. Benay Spice.

## 2017-09-26 NOTE — Telephone Encounter (Signed)
Scheduled appt per 1/8 los - Gave patient AVS and calender per los. - Central radiology to contact patient with PET scan

## 2017-09-26 NOTE — Progress Notes (Addendum)
[place] OFFICE PROGRESS NOTE   Diagnosis: Breast cancer  INTERVAL HISTORY:   Stacy Horn returns as scheduled.  She completed another treatment with Taxol/carboplatin 09/11/2017.  She denies nausea/vomiting.  No mouth sores.  No diarrhea.  She takes a laxative on an every other day basis typically.  Pain overall is controlled.  She takes hydrocodone as needed.  Pain is mainly located at the mid back and left shoulder.  She denies neuropathy symptoms.  Main complaint is being tired.  No confusion.  No excessive sedation.  Objective:  Vital signs in last 24 hours:  Blood pressure 124/73, pulse 100, temperature 98.4 F (36.9 C), temperature source Oral, resp. rate 20, height 5' 5" (1.651 m), weight 151 lb 11.2 oz (68.8 kg), last menstrual period 10/08/2014, SpO2 100 %.    HEENT: No thrush or ulcers. Resp: Lungs clear bilaterally. Cardio: Regular rate and rhythm. GI: Abdomen soft and nontender.  No hepatomegaly. Vascular: No leg edema. Neuro: Vibratory sense mildly decreased over the fingertips per tuning fork exam.  Alert and oriented.  Follows commands. Port-A-Cath without erythema.  Lab Results:  Lab Results  Component Value Date   WBC 3.7 (L) 09/26/2017   HGB 10.0 (L) 09/26/2017   HCT 29.9 (L) 09/26/2017   MCV 96.0 09/26/2017   PLT 193 09/26/2017   NEUTROABS 2.5 09/26/2017    Imaging:  No results found.  Medications: I have reviewed the patient's current medications.  Assessment/Plan: 1. Multicentric invasive lobular carcinoma of the right breast diagnosed in December of 1999, a right mastectomy followed by adjuvant [facility] chemotherapy, 5 years of tamoxifen, and 5 years of Femara. The Femara was completed in June of 2010. The right breast mass excision in January 2000 was ER positive, PR positive, and HER-2 negative  2. Pain at the left groin area with a CT of the pelvis on 02/07/12 confirming a destructive lytic lesion at the left pubic symphysis, status  post a CT-guided biopsy on 02/16/12 confirming metastatic carcinoma, focally ER positive .  -PET scan 03/07/2012 confirmed multiple hypermetabolic bone metastases with no other evidence of metastatic disease  -Status post palliative radiation to the pelvis beginning on 03/12/2012  -Initiation of Arimidex on 03/27/2012 -initiation of every three-month xgeva on 04/04/2012  -PET scan September 14 [id number] new hypermetabolic bone lesions  -Initiation of Faslodex 09/27/2012  -Restaging PET scan 04/08/2013 with evidence of disease progression in the bones  -Initiation of Aromasin/afinitor 05/29/2013  -Iliac biopsy at [facility] 05/22/2013 confirming HER-2 negative metastatic breast  -PET scan 11/18/2013 with a decrease in hypermetabolic activity  -PET scan 10/08/2014 with progressive diffuse hypermetabolic bone metastases -Initiation of Palbociclib 10/20/2014; Faslodex 10/23/2014. -Palbociclib placed on hold beginning 11/07/2014 due to neutropenia. -Palbociclib resumed 11/27/2014 on an every other day schedule -Palbociclib resumed 01/04/2015 at a reduced dose of 100 mg daily for 21 days. -Palbociclib beginning 02/12/2015 at a dose of 100 mg daily for 14 days on/14 days off. -PET scan 05/18/2015 with diffuse hypermetabolic bone lesions, increased metabolic activity compared to the PET scan from January 2016 -Palbociclib resumed 06/07/2015, Faslodex continued 06/12/2015 -PET scan 09/22/2015 with diffuse hypermetabolic bone lesions, increased metabolic activity compared to the PET scan 05/18/2015 -Continuation of palbociclib and Faslodex -palbociclib and Faslodex discontinued April 2017 secondary to a C7 spinous process fracture and hypercalcemia -PET scan 38/18/[id number] hypermetabolic bone metastases, with increased metabolic activity interpreted as consistent with disease progression - Xeloda (7 days on/7 days off) started 02/28/2016 -PET scan 08/31/2016-marked improvement in hypermetabolic  bone activity, no extra osseous disease -Xeloda continued -PET scan 06/05/2017-progression of bone metastase including a T3 lesion with paraspinal soft tissue, destructive lesion at T8 and progressive lesions at the shoulders, ribs, sternum, and pelvis -Xeloda discontinued 06/08/2017 -Initiation of weekly Taxol 06/26/2017, carboplatin added with week 3 07/10/2017 3. BRCA1 variant felt to be a polymorphism  4. Pain/tenderness at the left low anterolateral chest wall-she completed palliative radiation on 05/28/2012 with improvement in the pain. She was also treated with radiation to the thoracic spine.  5. Anemia/leukopenia-likely secondary to breast cancer involving the bone marrow and radiation /afinitor -the anemia is improved  6. History of mildly elevated liver enzymes-? Related to afinitor,? Secondary to metastatic breast cancer 7. Neutropenia secondary to Palbociclib. Improved. 8. Pain at the left shoulder and mid back-a plain x-ray of the left shoulder 06/12/2015 revealed a sclerotic density at the left scapula adjacent to the glenoid, there is metastatic disease involving the humeral head and left scapula on  05/18/2015. She completed a course of radiation to the left shoulder on 08/05/2015. 9. Low neck/upper back pain 12/24/2015 with MRI showing a mildly displaced acute-appearing C7 spinous process fracture; widespread osseous metastatic disease; no evidence of epidural tumor or spinal cord compression. - Completed palliative radiation to the cervical spine 02/17/2016 10. Dysphagia/odynophagia secondary to radiation-resolved 11. Xeloda-induced skin rash 12. Hypercalcemia secondary to #1. Status post Zometa 06/08/2017, 06/19/2017, and 07/03/2017.Pamidronate 06/26/2017.Calcitonin 07/03/2017, 07/04/2017, 07/06/2017; improved 07/07/2017; IV fluids and Xgeva-last given 09/26/2017 13.Nondisplaced left humerus fracture-likely a pathologic fracture, followed by orthopedics    Disposition:  Ms. Atwood appears unchanged.  Plan to proceed with Taxol/carboplatin today as scheduled.  She will return for the next chemotherapy 10/02/2017.  We are referring her for a restaging PET scan 10/05/2017.  She will return for a follow-up visit to review the results on 10/11/2017.  We reviewed today's labs.  The calcium level is further elevated.  She appears asymptomatic.  She will receive Xgeva today.  Patient seen with Dr. Benay Spice.  Ned Card ANP/GNP-BC   09/26/2017  11:35 AM This was a shared visit with Ned Card.  Ms. Keller appears stable.  She has persistent hypercalcemia.  She will receive Xgeva today. She will be referred for a restaging PET scan prior to an office visit 10/11/2017.  She plans to undergo repeat BRCA testing at [facility].  We will consider referring her back to Dr. Albertina Parr if the PET scan does not reveal a response to the current therapy.  Julieanne Manson, MD

## 2017-09-26 NOTE — Patient Instructions (Signed)
[facility] Discharge Instructions for Patients Receiving Chemotherapy  Today you received the following chemotherapy agents: Carboplatin and Taxol.  To help prevent nausea and vomiting after your treatment, we encourage you to take your nausea medication as directed   If you develop nausea and vomiting that is not controlled by your nausea medication, call the clinic.   BELOW ARE SYMPTOMS THAT SHOULD BE REPORTED IMMEDIATELY:  *FEVER GREATER THAN 100.5 F  *CHILLS WITH OR WITHOUT FEVER  NAUSEA AND VOMITING THAT IS NOT CONTROLLED WITH YOUR NAUSEA MEDICATION  *UNUSUAL SHORTNESS OF BREATH  *UNUSUAL BRUISING OR BLEEDING  TENDERNESS IN MOUTH AND THROAT WITH OR WITHOUT PRESENCE OF ULCERS  *URINARY PROBLEMS  *BOWEL PROBLEMS  UNUSUAL RASH Items with * indicate a potential emergency and should be followed up as soon as possible.  Feel free to call the clinic should you have any questions or concerns. The clinic phone number is [phone].  Please show the [place] at check-in to the Emergency Department and triage nurse.  Denosumab injection What is this medicine? DENOSUMAB (den oh sue mab) slows bone breakdown. Prolia is used to treat osteoporosis in women after menopause and in men. Delton See is used to treat a high calcium level due to cancer and to prevent bone fractures and other bone problems caused by multiple myeloma or cancer bone metastases. Delton See is also used to treat giant cell tumor of the bone. This medicine may be used for other purposes; ask your health care provider or pharmacist if you have questions. COMMON BRAND NAME(S): Prolia, XGEVA What should I tell my health care provider before I take this medicine? They need to know if you have any of these conditions: -dental disease -having surgery or tooth extraction -infection -kidney disease -low levels of calcium or Vitamin D in the blood -malnutrition -on hemodialysis -skin conditions or  sensitivity -thyroid or parathyroid disease -an unusual reaction to denosumab, other medicines, foods, dyes, or preservatives -pregnant or trying to get pregnant -breast-feeding How should I use this medicine? This medicine is for injection under the skin. It is given by a health care professional in a hospital or clinic setting. If you are getting Prolia, a special MedGuide will be given to you by the pharmacist with each prescription and refill. Be sure to read this information carefully each time. For Prolia, talk to your pediatrician regarding the use of this medicine in children. Special care may be needed. For Delton See, talk to your pediatrician regarding the use of this medicine in children. While this drug may be prescribed for children as young as 13 years for selected conditions, precautions do apply. Overdosage: If you think you have taken too much of this medicine contact a poison control center or emergency room at once. NOTE: This medicine is only for you. Do not share this medicine with others. What if I miss a dose? It is important not to miss your dose. Call your doctor or health care professional if you are unable to keep an appointment. What may interact with this medicine? Do not take this medicine with any of the following medications: -other medicines containing denosumab This medicine may also interact with the following medications: -medicines that lower your chance of fighting infection -steroid medicines like prednisone or cortisone This list may not describe all possible interactions. Give your health care provider a list of all the medicines, herbs, non-prescription drugs, or dietary supplements you use. Also tell them if you smoke, drink alcohol, or  use illegal drugs. Some items may interact with your medicine. What should I watch for while using this medicine? Visit your doctor or health care professional for regular checks on your progress. Your doctor or health care  professional may order blood tests and other tests to see how you are doing. Call your doctor or health care professional for advice if you get a fever, chills or sore throat, or other symptoms of a cold or flu. Do not treat yourself. This drug may decrease your body's ability to fight infection. Try to avoid being around people who are sick. You should make sure you get enough calcium and vitamin D while you are taking this medicine, unless your doctor tells you not to. Discuss the foods you eat and the vitamins you take with your health care professional. See your dentist regularly. Brush and floss your teeth as directed. Before you have any dental work done, tell your dentist you are receiving this medicine. Do not become pregnant while taking this medicine or for 5 months after stopping it. Talk with your doctor or health care professional about your birth control options while taking this medicine. Women should inform their doctor if they wish to become pregnant or think they might be pregnant. There is a potential for serious side effects to an unborn child. Talk to your health care professional or pharmacist for more information. What side effects may I notice from receiving this medicine? Side effects that you should report to your doctor or health care professional as soon as possible: -allergic reactions like skin rash, itching or hives, swelling of the face, lips, or tongue -bone pain -breathing problems -dizziness -jaw pain, especially after dental work -redness, blistering, peeling of the skin -signs and symptoms of infection like fever or chills; cough; sore throat; pain or trouble passing urine -signs of low calcium like fast heartbeat, muscle cramps or muscle pain; pain, tingling, numbness in the hands or feet; seizures -unusual bleeding or bruising -unusually weak or tired Side effects that usually do not require medical attention (report to your doctor or health care professional  if they continue or are bothersome): -constipation -diarrhea -headache -joint pain -loss of appetite -muscle pain -runny nose -tiredness -upset stomach This list may not describe all possible side effects. Call your doctor for medical advice about side effects. You may report side effects to FDA at 1-800-FDA-1088. Where should I keep my medicine? This medicine is only given in a clinic, doctor's office, or other health care setting and will not be stored at home. NOTE: This sheet is a summary. It may not cover all possible information. If you have questions about this medicine, talk to your doctor, pharmacist, or health care provider.  2018 Elsevier/Gold Standard (2016-09-27 19:17:21)

## 2017-09-29 ENCOUNTER — Encounter: Payer: Self-pay | Admitting: Nurse Practitioner

## 2017-09-29 NOTE — Progress Notes (Signed)
Received PA determination for Ondansetron.  PA approved 09/19/17-09/29/18.  Called [facility]) to advise of approval. She states they picked it up on 09/27/17.

## 2017-09-29 NOTE — Progress Notes (Signed)
Received PA request for Ondansetron. Submitted via Cover My Meds:  Stacy Horn (Key: [id number])   Your information has been submitted to [place] Medicare Part D. [employer] will review the request and will issue a decision, typically within 1-3 days from your submission. You can check the updated outcome later by reopening this request. If [employer] has not responded in 1-3 days or if you have any questions about your ePA request, please contact [place] Medicare Part D at [phone]. If you think there may be a problem with your PA request, use our live chat feature at the bottom right.

## 2017-10-01 ENCOUNTER — Other Ambulatory Visit: Payer: Self-pay | Admitting: Oncology

## 2017-10-02 ENCOUNTER — Inpatient Hospital Stay: Payer: Medicare Other

## 2017-10-02 ENCOUNTER — Telehealth: Payer: Self-pay

## 2017-10-02 VITALS — BP 115/56 | Temp 98.5°F | Resp 18 | Ht 65.0 in | Wt 152.2 lb

## 2017-10-02 DIAGNOSIS — C50919 Malignant neoplasm of unspecified site of unspecified female breast: Secondary | ICD-10-CM

## 2017-10-02 DIAGNOSIS — C50911 Malignant neoplasm of unspecified site of right female breast: Secondary | ICD-10-CM | POA: Diagnosis not present

## 2017-10-02 DIAGNOSIS — C7951 Secondary malignant neoplasm of bone: Secondary | ICD-10-CM | POA: Diagnosis not present

## 2017-10-02 DIAGNOSIS — Z170 Estrogen receptor positive status [ER+]: Secondary | ICD-10-CM | POA: Diagnosis not present

## 2017-10-02 DIAGNOSIS — Z9889 Other specified postprocedural states: Secondary | ICD-10-CM

## 2017-10-02 DIAGNOSIS — D649 Anemia, unspecified: Secondary | ICD-10-CM | POA: Diagnosis not present

## 2017-10-02 DIAGNOSIS — Z5111 Encounter for antineoplastic chemotherapy: Secondary | ICD-10-CM | POA: Diagnosis not present

## 2017-10-02 LAB — CMP (CANCER CENTER ONLY)
ALBUMIN: 3.6 g/dL (ref 3.5–5.0)
ALK PHOS: 138 U/L (ref 40–150)
ALT: 44 U/L (ref 0–55)
AST: 51 U/L — AB (ref 5–34)
Anion gap: 9 (ref 3–11)
BILIRUBIN TOTAL: 0.5 mg/dL (ref 0.2–1.2)
BUN: 11 mg/dL (ref 7–26)
CO2: 21 mmol/L — ABNORMAL LOW (ref 22–29)
Calcium: 13 mg/dL — ABNORMAL HIGH (ref 8.4–10.4)
Chloride: 108 mmol/L (ref 98–109)
Creatinine: 0.8 mg/dL (ref 0.60–1.10)
GFR, Est AFR Am: >60 mL/min (ref >60)
GFR, Estimated: >60 mL/min (ref >60)
GLUCOSE: 95 mg/dL (ref 70–140)
Potassium: 3.7 mmol/L (ref 3.3–4.7)
Sodium: 138 mmol/L (ref 136–145)
TOTAL PROTEIN: 7.3 g/dL (ref 6.4–8.3)

## 2017-10-02 LAB — CBC WITH DIFFERENTIAL (CANCER CENTER ONLY)
BASOS ABS: 0 10*3/uL (ref 0.0–0.1)
BASOS PCT: 1 %
Eosinophils Absolute: 0 10*3/uL (ref 0.0–0.5)
Eosinophils Relative: 0 %
HEMATOCRIT: 27.2 % — AB (ref 34.8–46.6)
HEMOGLOBIN: 9 g/dL — AB (ref 11.6–15.9)
Lymphocytes Relative: 28 %
Lymphs Abs: 0.9 10*3/uL (ref 0.9–3.3)
MCH: 31.4 pg (ref 25.1–34.0)
MCHC: 33.1 g/dL (ref 31.5–36.0)
MCV: 94.8 fL (ref 79.5–101.0)
Monocytes Absolute: 0.2 10*3/uL (ref 0.1–0.9)
Monocytes Relative: 6 %
NEUTROS PCT: 65 %
Neutro Abs: 2.2 10*3/uL (ref 1.5–6.5)
Platelet Count: 181 10*3/uL (ref 145–400)
RBC: 2.87 MIL/uL — AB (ref 3.70–5.45)
RDW: 18.1 % — ABNORMAL HIGH (ref 11.2–16.1)
WBC: 3.4 10*3/uL — AB (ref 3.9–10.3)

## 2017-10-02 MED ORDER — FAMOTIDINE IN NACL 20-0.9 MG/50ML-% IV SOLN
20.0000 mg | Freq: Once | INTRAVENOUS | Status: AC
  Administered 2017-10-02: 20 mg via INTRAVENOUS

## 2017-10-02 MED ORDER — SODIUM CHLORIDE 0.9 % IV SOLN
186.8000 mg | Freq: Once | INTRAVENOUS | Status: AC
  Administered 2017-10-02: 190 mg via INTRAVENOUS
  Filled 2017-10-02: qty 19

## 2017-10-02 MED ORDER — DEXAMETHASONE SODIUM PHOSPHATE 10 MG/ML IJ SOLN
10.0000 mg | Freq: Once | INTRAMUSCULAR | Status: AC
  Administered 2017-10-02: 10 mg via INTRAVENOUS

## 2017-10-02 MED ORDER — SODIUM CHLORIDE 0.9 % IV SOLN
80.0000 mg/m2 | Freq: Once | INTRAVENOUS | Status: AC
  Administered 2017-10-02: 150 mg via INTRAVENOUS
  Filled 2017-10-02: qty 25

## 2017-10-02 MED ORDER — ONDANSETRON HCL 8 MG PO TABS
ORAL_TABLET | ORAL | Status: AC
  Filled 2017-10-02: qty 1

## 2017-10-02 MED ORDER — SODIUM CHLORIDE 0.9 % IV SOLN
Freq: Once | INTRAVENOUS | Status: AC
  Administered 2017-10-02: 11:00:00 via INTRAVENOUS

## 2017-10-02 MED ORDER — HEPARIN SOD (PORK) LOCK FLUSH 100 UNIT/ML IV SOLN
500.0000 [IU] | Freq: Once | INTRAVENOUS | Status: AC | PRN
  Administered 2017-10-02: 500 [IU]
  Filled 2017-10-02: qty 5

## 2017-10-02 MED ORDER — DEXAMETHASONE SODIUM PHOSPHATE 10 MG/ML IJ SOLN
INTRAMUSCULAR | Status: AC
  Filled 2017-10-02: qty 1

## 2017-10-02 MED ORDER — SODIUM CHLORIDE 0.9% FLUSH
10.0000 mL | INTRAVENOUS | Status: DC | PRN
  Administered 2017-10-02: 10 mL
  Filled 2017-10-02: qty 10

## 2017-10-02 MED ORDER — FAMOTIDINE IN NACL 20-0.9 MG/50ML-% IV SOLN
INTRAVENOUS | Status: AC
  Filled 2017-10-02: qty 50

## 2017-10-02 MED ORDER — DIPHENHYDRAMINE HCL 50 MG/ML IJ SOLN
25.0000 mg | Freq: Once | INTRAMUSCULAR | Status: AC
  Administered 2017-10-02: 25 mg via INTRAVENOUS

## 2017-10-02 MED ORDER — DIPHENHYDRAMINE HCL 50 MG/ML IJ SOLN
INTRAMUSCULAR | Status: AC
  Filled 2017-10-02: qty 1

## 2017-10-02 MED ORDER — SODIUM CHLORIDE 0.9% FLUSH
10.0000 mL | Freq: Once | INTRAVENOUS | Status: AC
  Administered 2017-10-02: 10 mL
  Filled 2017-10-02: qty 10

## 2017-10-02 MED ORDER — ONDANSETRON HCL 8 MG PO TABS
8.0000 mg | ORAL_TABLET | Freq: Once | ORAL | Status: AC
  Administered 2017-10-02: 8 mg via ORAL

## 2017-10-02 NOTE — Patient Instructions (Signed)
[facility] Discharge Instructions for Patients Receiving Chemotherapy  Today you received the following chemotherapy agents: Paclitaxel (Taxol) and Carboplatin (Paraplatin).  To help prevent nausea and vomiting after your treatment, we encourage you to take your nausea medication as prescribed.  If you develop nausea and vomiting that is not controlled by your nausea medication, call the clinic.   BELOW ARE SYMPTOMS THAT SHOULD BE REPORTED IMMEDIATELY:  *FEVER GREATER THAN 100.5 F  *CHILLS WITH OR WITHOUT FEVER  NAUSEA AND VOMITING THAT IS NOT CONTROLLED WITH YOUR NAUSEA MEDICATION  *UNUSUAL SHORTNESS OF BREATH  *UNUSUAL BRUISING OR BLEEDING  TENDERNESS IN MOUTH AND THROAT WITH OR WITHOUT PRESENCE OF ULCERS  *URINARY PROBLEMS  *BOWEL PROBLEMS  UNUSUAL RASH Items with * indicate a potential emergency and should be followed up as soon as possible.  Feel free to call the clinic should you have any questions or concerns. The clinic phone number is [phone].  Please show the CHEMO ALERT CARD at check-in to the Emergency Department and triage nurse.   

## 2017-10-02 NOTE — Patient Instructions (Signed)
Implanted Port Home Guide An implanted port is a type of central line that is placed under the skin. Central lines are used to provide IV access when treatment or nutrition needs to be given through a person's veins. Implanted ports are used for long-term IV access. An implanted port may be placed because:  You need IV medicine that would be irritating to the small veins in your hands or arms.  You need long-term IV medicines, such as antibiotics.  You need IV nutrition for a long period.  You need frequent blood draws for lab tests.  You need dialysis.  Implanted ports are usually placed in the chest area, but they can also be placed in the upper arm, the abdomen, or the leg. An implanted port has two main parts:  Reservoir. The reservoir is round and will appear as a small, raised area under your skin. The reservoir is the part where a needle is inserted to give medicines or draw blood.  Catheter. The catheter is a thin, flexible tube that extends from the reservoir. The catheter is placed into a large vein. Medicine that is inserted into the reservoir goes into the catheter and then into the vein.  How will I care for my incision site? Do not get the incision site wet. Bathe or shower as directed by your health care provider. How is my port accessed? Special steps must be taken to access the port:  Before the port is accessed, a numbing cream can be placed on the skin. This helps numb the skin over the port site.  Your health care provider uses a sterile technique to access the port. ? Your health care provider must put on a mask and sterile gloves. ? The skin over your port is cleaned carefully with an antiseptic and allowed to dry. ? The port is gently pinched between sterile gloves, and a needle is inserted into the port.  Only "non-coring" port needles should be used to access the port. Once the port is accessed, a blood return should be checked. This helps ensure that the port  is in the vein and is not clogged.  If your port needs to remain accessed for a constant infusion, a clear (transparent) bandage will be placed over the needle site. The bandage and needle will need to be changed every week, or as directed by your health care provider.  Keep the bandage covering the needle clean and dry. Do not get it wet. Follow your health care provider's instructions on how to take a shower or bath while the port is accessed.  If your port does not need to stay accessed, no bandage is needed over the port.  What is flushing? Flushing helps keep the port from getting clogged. Follow your health care provider's instructions on how and when to flush the port. Ports are usually flushed with saline solution or a medicine called heparin. The need for flushing will depend on how the port is used.  If the port is used for intermittent medicines or blood draws, the port will need to be flushed: ? After medicines have been given. ? After blood has been drawn. ? As part of routine maintenance.  If a constant infusion is running, the port may not need to be flushed.  How long will my port stay implanted? The port can stay in for as long as your health care provider thinks it is needed. When it is time for the port to come out, surgery will be   done to remove it. The procedure is similar to the one performed when the port was put in. When should I seek immediate medical care? When you have an implanted port, you should seek immediate medical care if:  You notice a bad smell coming from the incision site.  You have swelling, redness, or drainage at the incision site.  You have more swelling or pain at the port site or the surrounding area.  You have a fever that is not controlled with medicine.  This information is not intended to replace advice given to you by your health care provider. Make sure you discuss any questions you have with your health care provider. Document  Released: 09/05/2005 Document Revised: 02/11/2016 Document Reviewed: 05/13/2013 Elsevier Interactive Patient Education  2017 Elsevier Inc.  

## 2017-10-02 NOTE — Telephone Encounter (Signed)
Call from lab with panic Calcium of 13.0. MD made aware. Ok to treat per Dr. Benay Spice.

## 2017-10-08 ENCOUNTER — Other Ambulatory Visit: Payer: Self-pay | Admitting: Oncology

## 2017-10-10 ENCOUNTER — Ambulatory Visit (HOSPITAL_COMMUNITY)
Admission: RE | Admit: 2017-10-10 | Discharge: 2017-10-10 | Disposition: A | Discharge status: Home / Self Care | Payer: Medicare Other | Source: Ambulatory Visit | Attending: Nurse Practitioner | Admitting: Nurse Practitioner

## 2017-10-10 DIAGNOSIS — N281 Cyst of kidney, acquired: Secondary | ICD-10-CM | POA: Diagnosis not present

## 2017-10-10 DIAGNOSIS — C50919 Malignant neoplasm of unspecified site of unspecified female breast: Secondary | ICD-10-CM | POA: Diagnosis not present

## 2017-10-10 DIAGNOSIS — C7951 Secondary malignant neoplasm of bone: Secondary | ICD-10-CM | POA: Diagnosis not present

## 2017-10-10 LAB — GLUCOSE, CAPILLARY: GLUCOSE-CAPILLARY: 98 mg/dL (ref 65–99)

## 2017-10-10 MED ORDER — FLUDEOXYGLUCOSE F - 18 (FDG) INJECTION
7.5800 | Freq: Once | INTRAVENOUS | Status: AC | PRN
  Administered 2017-10-10: 7.58 via INTRAVENOUS

## 2017-10-11 ENCOUNTER — Telehealth: Payer: Self-pay | Admitting: Oncology

## 2017-10-11 ENCOUNTER — Inpatient Hospital Stay: Payer: Medicare Other

## 2017-10-11 ENCOUNTER — Inpatient Hospital Stay (HOSPITAL_BASED_OUTPATIENT_CLINIC_OR_DEPARTMENT_OTHER): Payer: Medicare Other | Admitting: Oncology

## 2017-10-11 ENCOUNTER — Encounter: Payer: Self-pay | Admitting: Nurse Practitioner

## 2017-10-11 VITALS — BP 123/67 | HR 120 | Temp 98.0°F | Resp 18 | Ht 65.0 in | Wt 152.8 lb

## 2017-10-11 DIAGNOSIS — C50919 Malignant neoplasm of unspecified site of unspecified female breast: Secondary | ICD-10-CM

## 2017-10-11 DIAGNOSIS — Z9889 Other specified postprocedural states: Secondary | ICD-10-CM

## 2017-10-11 DIAGNOSIS — C7951 Secondary malignant neoplasm of bone: Secondary | ICD-10-CM

## 2017-10-11 DIAGNOSIS — Z9011 Acquired absence of right breast and nipple: Secondary | ICD-10-CM | POA: Diagnosis not present

## 2017-10-11 DIAGNOSIS — R5381 Other malaise: Secondary | ICD-10-CM

## 2017-10-11 DIAGNOSIS — D649 Anemia, unspecified: Secondary | ICD-10-CM

## 2017-10-11 DIAGNOSIS — Z853 Personal history of malignant neoplasm of breast: Secondary | ICD-10-CM

## 2017-10-11 DIAGNOSIS — Z923 Personal history of irradiation: Secondary | ICD-10-CM

## 2017-10-11 DIAGNOSIS — Z170 Estrogen receptor positive status [ER+]: Secondary | ICD-10-CM

## 2017-10-11 DIAGNOSIS — Z9223 Personal history of estrogen therapy: Secondary | ICD-10-CM | POA: Diagnosis not present

## 2017-10-11 DIAGNOSIS — Z5111 Encounter for antineoplastic chemotherapy: Secondary | ICD-10-CM | POA: Diagnosis not present

## 2017-10-11 DIAGNOSIS — R945 Abnormal results of liver function studies: Secondary | ICD-10-CM | POA: Diagnosis not present

## 2017-10-11 DIAGNOSIS — C50911 Malignant neoplasm of unspecified site of right female breast: Secondary | ICD-10-CM | POA: Diagnosis not present

## 2017-10-11 DIAGNOSIS — K760 Fatty (change of) liver, not elsewhere classified: Secondary | ICD-10-CM | POA: Diagnosis not present

## 2017-10-11 LAB — COMPREHENSIVE METABOLIC PANEL
ALT: 50 U/L (ref 0–55)
ANION GAP: 9 (ref 3–11)
AST: 83 U/L — AB (ref 5–34)
Albumin: 3.6 g/dL (ref 3.5–5.0)
Alkaline Phosphatase: 122 U/L (ref 40–150)
BILIRUBIN TOTAL: 0.6 mg/dL (ref 0.2–1.2)
BUN: 9 mg/dL (ref 7–26)
CHLORIDE: 110 mmol/L — AB (ref 98–109)
CO2: 21 mmol/L — AB (ref 22–29)
Calcium: 12.3 mg/dL — ABNORMAL HIGH (ref 8.4–10.4)
Creatinine, Ser: 0.68 mg/dL (ref 0.60–1.10)
GFR calc Af Amer: >60 mL/min (ref >60)
GFR calc non Af Amer: >60 mL/min (ref >60)
GLUCOSE: 94 mg/dL (ref 70–140)
POTASSIUM: 3.5 mmol/L (ref 3.3–4.7)
SODIUM: 140 mmol/L (ref 136–145)
TOTAL PROTEIN: 7.1 g/dL (ref 6.4–8.3)

## 2017-10-11 LAB — CBC WITH DIFFERENTIAL/PLATELET
BASOS ABS: 0 10*3/uL (ref 0.0–0.1)
Basophils Relative: 1 %
Eosinophils Absolute: 0 10*3/uL (ref 0.0–0.5)
Eosinophils Relative: 0 %
HEMATOCRIT: 25.5 % — AB (ref 34.8–46.6)
Hemoglobin: 8.2 g/dL — ABNORMAL LOW (ref 11.6–15.9)
LYMPHS ABS: 0.7 10*3/uL — AB (ref 0.9–3.3)
LYMPHS PCT: 29 %
MCH: 31.1 pg (ref 25.1–34.0)
MCHC: 32.2 g/dL (ref 31.5–36.0)
MCV: 96.6 fL (ref 79.5–101.0)
Monocytes Absolute: 0.3 10*3/uL (ref 0.1–0.9)
Monocytes Relative: 13 %
NEUTROS ABS: 1.4 10*3/uL — AB (ref 1.5–6.5)
Neutrophils Relative %: 57 %
Platelets: 120 10*3/uL — ABNORMAL LOW (ref 145–400)
RBC: 2.64 MIL/uL — AB (ref 3.70–5.45)
RDW: 18 % — ABNORMAL HIGH (ref 11.2–16.1)
WBC: 2.4 10*3/uL — ABNORMAL LOW (ref 3.9–10.3)

## 2017-10-11 MED ORDER — SODIUM CHLORIDE 0.9% FLUSH
10.0000 mL | Freq: Once | INTRAVENOUS | Status: AC
  Administered 2017-10-11: 10 mL
  Filled 2017-10-11: qty 10

## 2017-10-11 MED ORDER — HEPARIN SOD (PORK) LOCK FLUSH 100 UNIT/ML IV SOLN
500.0000 [IU] | Freq: Once | INTRAVENOUS | Status: AC
  Administered 2017-10-11: 500 [IU]
  Filled 2017-10-11: qty 5

## 2017-10-11 MED ORDER — HYDROCODONE-ACETAMINOPHEN 10-325 MG PO TABS: 1.0000 | ORAL_TABLET | ORAL | 0 refills | Status: DC | PRN

## 2017-10-11 NOTE — Telephone Encounter (Signed)
Patient scheduled and given copy of AVS/Calendar °

## 2017-10-11 NOTE — Progress Notes (Signed)
[facility] OFFICE PROGRESS NOTE   Diagnosis: Breast cancer  INTERVAL HISTORY:   Ms. Provencio returns as scheduled.  She completed another cycle of Taxol/carboplatin 10/02/2017.  She reports stable pain.  She has malaise.  She stays in the house in bed most of the time.  She is able to ambulate and dresses herself.   Objective:  Vital signs in last 24 hours:  Blood pressure 123/67, pulse (!) 120, temperature 98 F (36.7 C), temperature source Oral, resp. rate 18, height '5\' 5"'  (1.651 m), weight 152 lb 12.8 oz (69.3 kg), last menstrual period 10/08/2014, SpO2 100 %.    HEENT: No thrush or ulcers Resp: Lungs clear bilaterally Cardio: Regular rate and rhythm, tachycardia GI: No hepatomegaly, nontender Vascular: No leg edema   Portacath/PICC-without erythema  Lab Results:  Lab Results  Component Value Date   WBC 2.4 (L) 10/11/2017   HGB 8.2 (L) 10/11/2017   HCT 25.5 (L) 10/11/2017   MCV 96.6 10/11/2017   PLT 120 (L) 10/11/2017   NEUTROABS 1.4 (L) 10/11/2017    CMP     Component Value Date/Time   NA 140 10/11/2017 1030   NA 141 09/11/2017 1106   K 3.5 10/11/2017 1030   K 3.1 (L) 09/11/2017 1106   CL 110 (H) 10/11/2017 1030   CL 107 12/20/2012 0927   CO2 21 (L) 10/11/2017 1030   CO2 19 (L) 09/11/2017 1106   GLUCOSE 94 10/11/2017 1030   GLUCOSE 136 09/11/2017 1106   GLUCOSE 89 12/20/2012 0927   BUN 9 10/11/2017 1030   BUN 8.7 09/11/2017 1106   CREATININE 0.68 10/11/2017 1030   CREATININE 0.8 09/11/2017 1106   CALCIUM 12.3 (H) 10/11/2017 1030   CALCIUM 12.4 (H) 09/11/2017 1106   PROT 7.1 10/11/2017 1030   PROT 7.0 09/11/2017 1106   ALBUMIN 3.6 10/11/2017 1030   ALBUMIN 3.7 09/11/2017 1106   AST 83 (H) 10/11/2017 1030   AST 51 (H) 10/02/2017 0944   AST 53 (H) 09/11/2017 1106   ALT 50 10/11/2017 1030   ALT 44 10/02/2017 0944   ALT 38 09/11/2017 1106   ALKPHOS 122 10/11/2017 1030   ALKPHOS 124 09/11/2017 1106   BILITOT 0.6 10/11/2017 1030   BILITOT 0.5 10/02/2017 0944   BILITOT 0.39 09/11/2017 1106   GFRNONAA >60 10/11/2017 1030   GFRNONAA >60 10/02/2017 0944   GFRAA >60 10/11/2017 1030   GFRAA >60 10/02/2017 0944    Lab Results  Component Value Date   CEA1 <1.00 09/04/2017     Imaging:  Nm Pet Image Restag (ps) Skull Base To Thigh  Result Date: 10/10/2017 CLINICAL DATA:  Subsequent treatment strategy for breast cancer. EXAM: NUCLEAR MEDICINE PET SKULL BASE TO THIGH TECHNIQUE: 7.6 mCi F-18 FDG was injected intravenously. Full-ring PET imaging was performed from the skull base to thigh after the radiotracer. CT data was obtained and used for attenuation correction and anatomic localization. FASTING BLOOD GLUCOSE:  Value: 98 mg/dl COMPARISON:  06/05/2017 FINDINGS: NECK: No hypermetabolic lymph nodes in the neck. CHEST: No hypermetabolic mediastinal or hilar nodes. No suspicious pulmonary nodules on the CT scan. Left Port-A-Cath tip is in the mid right atrium. ABDOMEN/PELVIS: Geographic fatty deposition again noted within the liver parenchyma. Interval development of multiple hyperattenuating round lesions within the diffusely fatty liver parenchyma (see 15 mm lesion image 77 series 4). 14 mm lesion is visible on image 80. There is a 2.2 cm lesion also visible on image 80 that appears to show hypermetabolic  FDG accumulation just above background liver parenchyma with SUV max = 4.4. No other hypermetabolic soft tissue disease identified within the abdomen. Complex cyst lower pole left kidney is not appreciably changed in the interval. 2.6 cm right adnexal cyst is similar to prior. SKELETON: The markedly hypermetabolic widespread bony metastases are similar in distribution although generally appear diffusely decreased in hypermetabolic FDG accumulation. The index T3 lesion measured on the prior study with SUV max = 16.3 now shows SUV max = 8.3. The destructive soft tissue lesion in the T8 vertebral body extending into the left posterior  elements on the prior study demonstrated SUV max = 24.3. This same lesion demonstrates SUV max = 7.9 today. The left iliac crest lesion measured with SUV max = 7.5 on the previous study now demonstrates SUV max = 5.8. IMPRESSION: 1. Geographic fatty deposition in the liver parenchyma with interval development of multiple hyperattenuating round lesions in the liver visible on the background of low-density fatty change. 1 of the larger lesions appears to have FDG accumulation above background liver parenchyma. Imaging features are highly concerning for development of hepatic metastases. Abdominal MRI without with contrast would be the study of choice to further evaluate. 2. Similar widespread distribution of bony metastatic involvement although bony metastases show overall decrease in hypermetabolism, as outlined above. 3. Similar appearance of complex inferior left renal cyst and small right adnexal cystic lesion, neither of which demonstrates hypermetabolism on PET imaging. Electronically Signed   By: Misty Stanley M.D.   On: 10/10/2017 14:32    Medications: I have reviewed the patient's current medications.   Assessment/Plan: 1. Multicentric invasive lobular carcinoma of the right breast diagnosed in December of 1999, a right mastectomy followed by adjuvant [facility] chemotherapy, 5 years of tamoxifen, and 5 years of Femara. The Femara was completed in June of 2010. The right breast mass excision in January 2000 was ER positive, PR positive, and HER-2 negative  2. Pain at the left groin area with a CT of the pelvis on 02/07/12 confirming a destructive lytic lesion at the left pubic symphysis, status post a CT-guided biopsy on 02/16/12 confirming metastatic carcinoma, focally ER positive .  -PET scan 03/07/2012 confirmed multiple hypermetabolic bone metastases with no other evidence of metastatic disease  -Status post palliative radiation to the pelvis beginning on 03/12/2012  -Initiation of Arimidex on  03/27/2012 -initiation of every three-month xgeva on 04/04/2012  -PET scan September 14 [id number] new hypermetabolic bone lesions  -Initiation of Faslodex 09/27/2012  -Restaging PET scan 04/08/2013 with evidence of disease progression in the bones  -Initiation of Aromasin/afinitor 05/29/2013  -Iliac biopsy at [facility] 05/22/2013 confirming HER-2 negative metastatic breast  -PET scan 11/18/2013 with a decrease in hypermetabolic activity  -PET scan 10/08/2014 with progressive diffuse hypermetabolic bone metastases -Initiation of Palbociclib 10/20/2014; Faslodex 10/23/2014. -Palbociclib placed on hold beginning 11/07/2014 due to neutropenia. -Palbociclib resumed 11/27/2014 on an every other day schedule -Palbociclib resumed 01/04/2015 at a reduced dose of 100 mg daily for 21 days. -Palbociclib beginning 02/12/2015 at a dose of 100 mg daily for 14 days on/14 days off. -PET scan 05/18/2015 with diffuse hypermetabolic bone lesions, increased metabolic activity compared to the PET scan from January 2016 -Palbociclib resumed 06/07/2015, Faslodex continued 06/12/2015 -PET scan 09/22/2015 with diffuse hypermetabolic bone lesions, increased metabolic activity compared to the PET scan 05/18/2015 -Continuation of palbociclib and Faslodex -palbociclib and Faslodex discontinued April 2017 secondary to a C7 spinous process fracture and hypercalcemia -PET scan 76/16/[id number] hypermetabolic bone metastases, with increased  metabolic activity interpreted as consistent with disease progression - Xeloda (7 days on/7 days off) started 02/28/2016 -PET scan 08/31/2016-marked improvement in hypermetabolic bone activity, no extra osseous disease -Xeloda continued -PET scan 06/05/2017-progression of bone metastase including a T3 lesion with paraspinal soft tissue, destructive lesion at T8 and progressive lesions at the shoulders, ribs, sternum, and pelvis -Xeloda discontinued 06/08/2017 -Initiation of weekly  Taxol 06/26/2017, carboplatin added with week 3 07/10/2017, last Taxol/carboplatin given 10/02/2017 -PET scan 10/10/2017, development of rounded liver lesions in the setting of fatty liver with SUV above background suspicious for metastases, decreased as you be associated with multiple bone lesions 3. BRCA1 variant felt to be a polymorphism  4. Pain/tenderness at the left low anterolateral chest wall-she completed palliative radiation on 05/28/2012 with improvement in the pain. She was also treated with radiation to the thoracic spine.  5. Anemia/leukopenia-likely secondary to breast cancer involving the bone marrow and radiation /afinitor -the anemia is improved  6. History of mildly elevated liver enzymes-? Related to afinitor,? Secondary to metastatic breast cancer 7. Neutropenia secondary to Palbociclib. Improved. 8. Pain at the left shoulder and mid back-a plain x-ray of the left shoulder 06/12/2015 revealed a sclerotic density at the left scapula adjacent to the glenoid, there is metastatic disease involving the humeral head and left scapula on  05/18/2015. She completed a course of radiation to the left shoulder on 08/05/2015. 9. Low neck/upper back pain 12/24/2015 with MRI showing a mildly displaced acute-appearing C7 spinous process fracture; widespread osseous metastatic disease; no evidence of epidural tumor or spinal cord compression. - Completed palliative radiation to the cervical spine 02/17/2016 10. Dysphagia/odynophagia secondary to radiation-resolved 11. Xeloda-induced skin rash 12. Hypercalcemia secondary to #1. Status post Zometa 06/08/2017, 06/19/2017, and 07/03/2017.Pamidronate 06/26/2017.Calcitonin 07/03/2017, 07/04/2017, 07/06/2017; improved 07/07/2017; IV fluids and Xgeva-last given 09/26/2017 13.Nondisplaced left humerus fracture-likely a pathologic fracture, followed by orthopedics   Disposition: Ms. Sorber appears unchanged.  The restaging PET scan reveals persistent  diffuse bone metastases, though the SUV of some of the lesions has decreased.  There is suspicion for new liver metastases.  She will be referred for an MRI of the liver to confirm metastatic disease.  Her overall clinical picture is suggestive of disease progression with anemia, persistent hypercalcemia, and pain.  The malaise may be related to progression of cancer versus anemia.  She declines a red cell transfusion today.  I will refer her to Dr. Albertina Parr for an opinion regarding salvage treatment options and to undergo additional molecular testing on the BRCA1 variant.  She will return for an office visit in 2 weeks.  30 minutes were spent with the patient today.  The majority of the time was used for counseling and coordination of care.  [name], MD  10/11/2017  11:51 AM

## 2017-10-19 DIAGNOSIS — Z9889 Other specified postprocedural states: Secondary | ICD-10-CM | POA: Diagnosis not present

## 2017-10-19 DIAGNOSIS — M67912 Unspecified disorder of synovium and tendon, left shoulder: Secondary | ICD-10-CM | POA: Diagnosis not present

## 2017-10-24 DIAGNOSIS — C50911 Malignant neoplasm of unspecified site of right female breast: Secondary | ICD-10-CM | POA: Diagnosis not present

## 2017-10-24 DIAGNOSIS — Z803 Family history of malignant neoplasm of breast: Secondary | ICD-10-CM | POA: Diagnosis not present

## 2017-10-25 ENCOUNTER — Inpatient Hospital Stay: Payer: Medicare Other

## 2017-10-25 ENCOUNTER — Encounter: Payer: Self-pay | Admitting: Nurse Practitioner

## 2017-10-25 ENCOUNTER — Inpatient Hospital Stay: Payer: Medicare Other | Attending: Nurse Practitioner | Admitting: Nurse Practitioner

## 2017-10-25 ENCOUNTER — Telehealth: Payer: Self-pay | Admitting: Oncology

## 2017-10-25 VITALS — BP 131/76 | HR 102 | Temp 98.8°F | Resp 19 | Ht 65.0 in | Wt 150.5 lb

## 2017-10-25 DIAGNOSIS — R000 Tachycardia, unspecified: Secondary | ICD-10-CM | POA: Insufficient documentation

## 2017-10-25 DIAGNOSIS — Z9011 Acquired absence of right breast and nipple: Secondary | ICD-10-CM | POA: Insufficient documentation

## 2017-10-25 DIAGNOSIS — C50919 Malignant neoplasm of unspecified site of unspecified female breast: Secondary | ICD-10-CM | POA: Diagnosis not present

## 2017-10-25 DIAGNOSIS — C7951 Secondary malignant neoplasm of bone: Secondary | ICD-10-CM

## 2017-10-25 DIAGNOSIS — C50911 Malignant neoplasm of unspecified site of right female breast: Secondary | ICD-10-CM | POA: Diagnosis not present

## 2017-10-25 DIAGNOSIS — Z9223 Personal history of estrogen therapy: Secondary | ICD-10-CM | POA: Diagnosis not present

## 2017-10-25 DIAGNOSIS — G893 Neoplasm related pain (acute) (chronic): Secondary | ICD-10-CM | POA: Diagnosis not present

## 2017-10-25 DIAGNOSIS — C787 Secondary malignant neoplasm of liver and intrahepatic bile duct: Secondary | ICD-10-CM | POA: Diagnosis not present

## 2017-10-25 DIAGNOSIS — Z5111 Encounter for antineoplastic chemotherapy: Secondary | ICD-10-CM | POA: Diagnosis not present

## 2017-10-25 DIAGNOSIS — R110 Nausea: Secondary | ICD-10-CM | POA: Diagnosis not present

## 2017-10-25 DIAGNOSIS — D72819 Decreased white blood cell count, unspecified: Secondary | ICD-10-CM | POA: Diagnosis not present

## 2017-10-25 DIAGNOSIS — Z170 Estrogen receptor positive status [ER+]: Secondary | ICD-10-CM | POA: Insufficient documentation

## 2017-10-25 DIAGNOSIS — D649 Anemia, unspecified: Secondary | ICD-10-CM | POA: Diagnosis not present

## 2017-10-25 DIAGNOSIS — Z79899 Other long term (current) drug therapy: Secondary | ICD-10-CM | POA: Diagnosis not present

## 2017-10-25 DIAGNOSIS — D696 Thrombocytopenia, unspecified: Secondary | ICD-10-CM | POA: Diagnosis not present

## 2017-10-25 DIAGNOSIS — R531 Weakness: Secondary | ICD-10-CM | POA: Insufficient documentation

## 2017-10-25 DIAGNOSIS — Z923 Personal history of irradiation: Secondary | ICD-10-CM | POA: Insufficient documentation

## 2017-10-25 DIAGNOSIS — Z9889 Other specified postprocedural states: Secondary | ICD-10-CM

## 2017-10-25 LAB — CMP (CANCER CENTER ONLY)
ALBUMIN: 3.5 g/dL (ref 3.5–5.0)
ALT: 48 U/L (ref 0–55)
ANION GAP: 9 (ref 3–11)
AST: 130 U/L — ABNORMAL HIGH (ref 5–34)
Alkaline Phosphatase: 185 U/L — ABNORMAL HIGH (ref 40–150)
BUN: 10 mg/dL (ref 7–26)
CALCIUM: 13.3 mg/dL — AB (ref 8.4–10.4)
CHLORIDE: 108 mmol/L (ref 98–109)
CO2: 21 mmol/L — AB (ref 22–29)
Creatinine: 0.78 mg/dL (ref 0.60–1.10)
GFR, Estimated: >60 mL/min (ref >60)
GLUCOSE: 104 mg/dL (ref 70–140)
POTASSIUM: 3.7 mmol/L (ref 3.5–5.1)
SODIUM: 138 mmol/L (ref 136–145)
Total Bilirubin: 0.6 mg/dL (ref 0.2–1.2)
Total Protein: 7.2 g/dL (ref 6.4–8.3)

## 2017-10-25 LAB — CBC WITH DIFFERENTIAL (CANCER CENTER ONLY)
Basophils Absolute: 0 10*3/uL (ref 0.0–0.1)
Basophils Relative: 1 %
EOS ABS: 0 10*3/uL (ref 0.0–0.5)
Eosinophils Relative: 0 %
HEMATOCRIT: 23.8 % — AB (ref 34.8–46.6)
HEMOGLOBIN: 8 g/dL — AB (ref 11.6–15.9)
LYMPHS ABS: 0.8 10*3/uL — AB (ref 0.9–3.3)
LYMPHS PCT: 21 %
MCH: 31.8 pg (ref 25.1–34.0)
MCHC: 33.6 g/dL (ref 31.5–36.0)
MCV: 94.5 fL (ref 79.5–101.0)
MONOS PCT: 10 %
Monocytes Absolute: 0.4 10*3/uL (ref 0.1–0.9)
NEUTROS ABS: 2.5 10*3/uL (ref 1.5–6.5)
NEUTROS PCT: 68 %
Platelet Count: 128 10*3/uL — ABNORMAL LOW (ref 145–400)
RBC: 2.52 MIL/uL — ABNORMAL LOW (ref 3.70–5.45)
RDW: 19.5 % — ABNORMAL HIGH (ref 11.2–14.5)
WBC Count: 3.7 10*3/uL — ABNORMAL LOW (ref 3.9–10.3)

## 2017-10-25 MED ORDER — DENOSUMAB 120 MG/1.7ML ~~LOC~~ SOLN
120.0000 mg | Freq: Once | SUBCUTANEOUS | Status: AC
  Administered 2017-10-25: 120 mg via SUBCUTANEOUS

## 2017-10-25 MED ORDER — MORPHINE SULFATE ER 15 MG PO TBCR: 15.0000 mg | EXTENDED_RELEASE_TABLET | Freq: Two times a day (BID) | ORAL | 0 refills | Status: AC

## 2017-10-25 MED ORDER — SODIUM CHLORIDE 0.9% FLUSH
10.0000 mL | Freq: Once | INTRAVENOUS | Status: AC
  Administered 2017-10-25: 10 mL
  Filled 2017-10-25: qty 10

## 2017-10-25 MED ORDER — HEPARIN SOD (PORK) LOCK FLUSH 100 UNIT/ML IV SOLN
500.0000 [IU] | Freq: Once | INTRAVENOUS | Status: AC
  Administered 2017-10-25: 500 [IU]
  Filled 2017-10-25: qty 5

## 2017-10-25 MED ORDER — DENOSUMAB 120 MG/1.7ML ~~LOC~~ SOLN
SUBCUTANEOUS | Status: AC
  Filled 2017-10-25: qty 1.7

## 2017-10-25 MED ORDER — HYDROMORPHONE HCL 4 MG PO TABS: 4.0000 mg | ORAL_TABLET | ORAL | 0 refills | Status: DC | PRN

## 2017-10-25 NOTE — Progress Notes (Addendum)
[place] OFFICE PROGRESS NOTE   Diagnosis: Breast cancer  INTERVAL HISTORY:   Ms. Schmale returns as scheduled.  She reports increased pain in the upper arms and shoulders.  She describes the pain as "terrible".  She gets minimal relief with hydrocodone every 4 hours.  Bowels are moving.  Objective:  Vital signs in last 24 hours:  Blood pressure 131/76, pulse (!) 102, temperature 98.8 F (37.1 C), temperature source Oral, resp. rate 19, height '5\' 5"'  (1.651 m), weight 150 lb 8 oz (68.3 kg), last menstrual period 10/08/2014, SpO2 100 %.    HEENT: No thrush or ulcers. Resp: Lungs clear bilaterally. Cardio: Regular rate and rhythm. GI: Abdomen soft and nontender.  No hepatomegaly. Vascular: No leg edema. Neuro: Alert and oriented.  Port-A-Cath without erythema.  Lab Results:  Lab Results  Component Value Date   WBC 3.7 (L) 10/25/2017   HGB 8.2 (L) 10/11/2017   HCT 23.8 (L) 10/25/2017   MCV 94.5 10/25/2017   PLT 128 (L) 10/25/2017   NEUTROABS 2.5 10/25/2017    Imaging:  No results found.  Medications: I have reviewed the patient's current medications.  Assessment/Plan: 1. Multicentric invasive lobular carcinoma of the right breast diagnosed in December of 1999, a right mastectomy followed by adjuvant [facility] chemotherapy, 5 years of tamoxifen, and 5 years of Femara. The Femara was completed in June of 2010. The right breast mass excision in January 2000 was ER positive, PR positive, and HER-2 negative  2. Pain at the left groin area with a CT of the pelvis on 02/07/12 confirming a destructive lytic lesion at the left pubic symphysis, status post a CT-guided biopsy on 02/16/12 confirming metastatic carcinoma, focally ER positive .  -PET scan 03/07/2012 confirmed multiple hypermetabolic bone metastases with no other evidence of metastatic disease  -Status post palliative radiation to the pelvis beginning on 03/12/2012  -Initiation of Arimidex on 03/27/2012  -initiation of every three-month xgeva on 04/04/2012  -PET scan September 14 [id number] new hypermetabolic bone lesions  -Initiation of Faslodex 09/27/2012  -Restaging PET scan 04/08/2013 with evidence of disease progression in the bones  -Initiation of Aromasin/afinitor 05/29/2013  -Iliac biopsy at [facility] 05/22/2013 confirming HER-2 negative metastatic breast  -PET scan 11/18/2013 with a decrease in hypermetabolic activity  -PET scan 10/08/2014 with progressive diffuse hypermetabolic bone metastases -Initiation of Palbociclib 10/20/2014; Faslodex 10/23/2014. -Palbociclib placed on hold beginning 11/07/2014 due to neutropenia. -Palbociclib resumed 11/27/2014 on an every other day schedule -Palbociclib resumed 01/04/2015 at a reduced dose of 100 mg daily for 21 days. -Palbociclib beginning 02/12/2015 at a dose of 100 mg daily for 14 days on/14 days off. -PET scan 05/18/2015 with diffuse hypermetabolic bone lesions, increased metabolic activity compared to the PET scan from January 2016 -Palbociclib resumed 06/07/2015, Faslodex continued 06/12/2015 -PET scan 09/22/2015 with diffuse hypermetabolic bone lesions, increased metabolic activity compared to the PET scan 05/18/2015 -Continuation of palbociclib and Faslodex -palbociclib and Faslodex discontinued April 2017 secondary to a C7 spinous process fracture and hypercalcemia -PET scan [id number] hypermetabolic bone metastases, with increased metabolic activity interpreted as consistent with disease progression - Xeloda (7 days on/7 days off) started 02/28/2016 -PET scan 08/31/2016-marked improvement in hypermetabolic bone activity, no extra osseous disease -Xeloda continued -PET scan 06/05/2017-progression of bone metastase including a T3 lesion with paraspinal soft tissue, destructive lesion at T8 and progressive lesions at the shoulders, ribs, sternum, and pelvis -Xeloda discontinued 06/08/2017 -Initiation of weekly Taxol  06/26/2017, carboplatin added with week 3 07/10/2017, last Taxol/carboplatin  given 10/02/2017 -PET scan 10/10/2017, development of rounded liver lesions in the setting of fatty liver with SUV above background suspicious for metastases, decreased as you be associated with multiple bone lesions 3. BRCA1 variant felt to be a polymorphism  4. Pain/tenderness at the left low anterolateral chest wall-she completed palliative radiation on 05/28/2012 with improvement in the pain. She was also treated with radiation to the thoracic spine.  5. Anemia/leukopenia-likely secondary to breast cancer involving the bone marrow and radiation /afinitor -the anemia is improved  6. History of mildly elevated liver enzymes-? Related to afinitor,? Secondary to metastatic breast cancer 7. Neutropenia secondary to Palbociclib. Improved. 8. Pain at the left shoulder and mid back-a plain x-ray of the left shoulder 06/12/2015 revealed a sclerotic density at the left scapula adjacent to the glenoid, there is metastatic disease involving the humeral head and left scapula on 05/18/2015. She completed a course of radiation to the left shoulder on 08/05/2015. 9. Low neck/upper back pain 12/24/2015 with MRI showing a mildly displaced acute-appearing C7 spinous process fracture; widespread osseous metastatic disease; no evidence of epidural tumor or spinal cord compression. - Completed palliative radiation to the cervical spine 02/17/2016 10. Dysphagia/odynophagia secondary to radiation-resolved 11. Xeloda-induced skin rash 12. Hypercalcemia secondary to #1. Status post Zometa 06/08/2017, 06/19/2017, and 07/03/2017.Pamidronate 06/26/2017.Calcitonin 07/03/2017, 07/04/2017, 07/06/2017; improved 07/07/2017; IV fluids and Xgeva-last given 09/26/2017 13.Nondisplaced left humerus fracture-likely a pathologic fracture, followed by orthopedics   Disposition: Ms. Gathright appears unchanged.  Her pain is poorly controlled.  She will begin  MS Contin 15 mg every 12 hours and try Dilaudid 4 mg every 4 hours as needed for breakthrough pain.  She will contact the office if this is not effective.  We reviewed today's labs.  She has persistent hypercalcemia.  She will receive Xgeva today.  We will contact Dr. Albertina Parr office for an appointment to discuss salvage treatment options.  She was seen by genetics at [facility] yesterday.  MRI of the liver is scheduled on 10/31/2017.  She will return for labs and a follow-up visit in 2 weeks.  She will contact the office in the interim as outlined above or with any other problems.  Patient seen with Dr. Benay Spice.  Ned Card ANP/GNP-BC   10/25/2017  10:40 AM  This was a shared visit with Ned Card.  Ms. Zaborowski has progressive metastatic breast cancer.  I will contact Dr. Albertina Parr to see if she can get an appointment at [facility] soon.  She may be a candidate for a clinical trial at [facility].  Repeat BRCA testing is pending.  If she is not a clinical trial candidate we will consider salvage single agent systemic chemotherapy.  We added MS Contin and Dilaudid for pain.  25 minutes were spent with the patient today.  The majority of the time was used for counseling and coordination of care.

## 2017-10-25 NOTE — Telephone Encounter (Signed)
Scheduled appt per 2/6 los - Patient is aware of appt date and time .

## 2017-10-30 ENCOUNTER — Encounter: Payer: Self-pay | Admitting: Oncology

## 2017-10-30 ENCOUNTER — Ambulatory Visit (HOSPITAL_COMMUNITY): Payer: Medicare Other

## 2017-10-31 ENCOUNTER — Ambulatory Visit (HOSPITAL_COMMUNITY)
Admission: RE | Admit: 2017-10-31 | Discharge: 2017-10-31 | Disposition: A | Discharge status: Home / Self Care | Payer: Medicare Other | Source: Ambulatory Visit | Attending: Oncology | Admitting: Oncology

## 2017-10-31 ENCOUNTER — Inpatient Hospital Stay (HOSPITAL_BASED_OUTPATIENT_CLINIC_OR_DEPARTMENT_OTHER): Payer: Medicare Other | Admitting: Oncology

## 2017-10-31 ENCOUNTER — Other Ambulatory Visit: Payer: Self-pay

## 2017-10-31 ENCOUNTER — Telehealth: Payer: Self-pay | Admitting: Oncology

## 2017-10-31 ENCOUNTER — Telehealth: Payer: Self-pay

## 2017-10-31 VITALS — BP 108/70 | HR 122 | Temp 98.3°F | Resp 17 | Ht 65.0 in | Wt 147.5 lb

## 2017-10-31 DIAGNOSIS — C7951 Secondary malignant neoplasm of bone: Secondary | ICD-10-CM | POA: Insufficient documentation

## 2017-10-31 DIAGNOSIS — Z5111 Encounter for antineoplastic chemotherapy: Secondary | ICD-10-CM | POA: Diagnosis not present

## 2017-10-31 DIAGNOSIS — C50919 Malignant neoplasm of unspecified site of unspecified female breast: Secondary | ICD-10-CM | POA: Diagnosis not present

## 2017-10-31 DIAGNOSIS — C787 Secondary malignant neoplasm of liver and intrahepatic bile duct: Secondary | ICD-10-CM | POA: Insufficient documentation

## 2017-10-31 DIAGNOSIS — R531 Weakness: Secondary | ICD-10-CM

## 2017-10-31 DIAGNOSIS — Z923 Personal history of irradiation: Secondary | ICD-10-CM

## 2017-10-31 DIAGNOSIS — R000 Tachycardia, unspecified: Secondary | ICD-10-CM

## 2017-10-31 DIAGNOSIS — Z79899 Other long term (current) drug therapy: Secondary | ICD-10-CM

## 2017-10-31 DIAGNOSIS — N281 Cyst of kidney, acquired: Secondary | ICD-10-CM | POA: Insufficient documentation

## 2017-10-31 DIAGNOSIS — Z9011 Acquired absence of right breast and nipple: Secondary | ICD-10-CM

## 2017-10-31 DIAGNOSIS — Z170 Estrogen receptor positive status [ER+]: Secondary | ICD-10-CM | POA: Diagnosis not present

## 2017-10-31 DIAGNOSIS — G893 Neoplasm related pain (acute) (chronic): Secondary | ICD-10-CM

## 2017-10-31 DIAGNOSIS — D649 Anemia, unspecified: Secondary | ICD-10-CM | POA: Diagnosis not present

## 2017-10-31 DIAGNOSIS — C50911 Malignant neoplasm of unspecified site of right female breast: Secondary | ICD-10-CM | POA: Diagnosis not present

## 2017-10-31 DIAGNOSIS — Z9223 Personal history of estrogen therapy: Secondary | ICD-10-CM

## 2017-10-31 MED ORDER — GADOBENATE DIMEGLUMINE 529 MG/ML IV SOLN
20.0000 mL | Freq: Once | INTRAVENOUS | Status: AC | PRN
  Administered 2017-10-31: 14 mL via INTRAVENOUS

## 2017-10-31 NOTE — Progress Notes (Signed)
[place] OFFICE PROGRESS NOTE   Diagnosis: Breast cancer  INTERVAL HISTORY:   Stacy Horn returns as scheduled.  She started MS Contin and Dilaudid last week.  She continues to have pain and generalized weakness.  4 mg of Dilaudid partially relieves her pain. She underwent an MRI of the liver today.  This confirms extensive liver metastases.  Objective:  Vital signs in last 24 hours:  Blood pressure 108/70, pulse (!) 122, temperature 98.3 F (36.8 C), temperature source Oral, resp. rate 17, height 5' 5" (1.651 m), weight 147 lb 8 oz (66.9 kg), last menstrual period 10/08/2014, SpO2 100 %.    HEENT: No thrush Resp: Lungs clear bilaterally, no respiratory distress Cardio: Tachycardia, regular rate and rhythm GI: No hepatospleno megaly, nontender Vascular: No leg edema  Portacath/PICC-without erythema  Lab Results:  Lab Results  Component Value Date   WBC 3.7 (L) 10/25/2017   HGB 8.2 (L) 10/11/2017   HCT 23.8 (L) 10/25/2017   MCV 94.5 10/25/2017   PLT 128 (L) 10/25/2017   NEUTROABS 2.5 10/25/2017    CMP     Component Value Date/Time   NA 138 10/25/2017 0918   NA 141 09/11/2017 1106   K 3.7 10/25/2017 0918   K 3.1 (L) 09/11/2017 1106   CL 108 10/25/2017 0918   CL 107 12/20/2012 0927   CO2 21 (L) 10/25/2017 0918   CO2 19 (L) 09/11/2017 1106   GLUCOSE 104 10/25/2017 0918   GLUCOSE 136 09/11/2017 1106   GLUCOSE 89 12/20/2012 0927   BUN 10 10/25/2017 0918   BUN 8.7 09/11/2017 1106   CREATININE 0.78 10/25/2017 0918   CREATININE 0.8 09/11/2017 1106   CALCIUM 13.3 (HH) 10/25/2017 0918   CALCIUM 12.4 (H) 09/11/2017 1106   PROT 7.2 10/25/2017 0918   PROT 7.0 09/11/2017 1106   ALBUMIN 3.5 10/25/2017 0918   ALBUMIN 3.7 09/11/2017 1106   AST 130 (H) 10/25/2017 0918   AST 53 (H) 09/11/2017 1106   ALT 48 10/25/2017 0918   ALT 38 09/11/2017 1106   ALKPHOS 185 (H) 10/25/2017 0918   ALKPHOS 124 09/11/2017 1106   BILITOT 0.6 10/25/2017 0918   BILITOT  0.39 09/11/2017 1106   GFRNONAA >60 10/25/2017 0918   GFRAA >60 10/25/2017 0918    Lab Results  Component Value Date   CEA1 <1.00 09/04/2017    Lab Results  Component Value Date   INR 0.93 07/06/2017    Imaging:  Mr Liver W Wo Contrast  Result Date: 10/31/2017 CLINICAL DATA:  67 year old female with breast cancer and concern for liver metastases. Patient was in pain resulting in motion degradation this exam . EXAM: MRI ABDOMEN WITHOUT AND WITH CONTRAST TECHNIQUE: Multiplanar multisequence MR imaging of the abdomen was performed both before and after the administration of intravenous contrast. CONTRAST:  77m MULTIHANCE GADOBENATE DIMEGLUMINE 529 MG/ML IV SOLN COMPARISON:  None. FINDINGS: Lower chest: Unremarkable. Hepatobiliary: Innumerable enhancing lesions are seen scattered throughout both hepatic lobes consistent with metastatic disease. These are best demonstrated on postcontrast imaging. 15 mm rim enhancing lesion with central necrosis well demonstrated on image 69 of series 1103. posterior right hepatic dome lesion measures 16 mm on image 73. 2.2 cm lesion identified inferior aspect of the medial segment left liver on image 42 of series 11/03. Right spread liver metastases range in size from 3-4 mm up to maximum size of approximately 2 cm. There is no evidence for gallstones, gallbladder wall thickening, or pericholecystic fluid. No intrahepatic or extrahepatic  biliary dilation. Pancreas: No focal mass lesion. No dilatation of the main duct. No intraparenchymal cyst. No peripancreatic edema. Spleen:  No splenomegaly. No focal mass lesion. Adrenals/Urinary Tract: No adrenal nodule or mass. 2.4 cm Bosniak II cyst identified lower pole left kidney. Right kidney unremarkable. Stomach/Bowel: Stomach is nondistended. No gastric wall thickening. No evidence of outlet obstruction. Duodenum is normally positioned as is the ligament of Treitz. No small bowel or colonic dilatation within the  visualized abdomen. Vascular/Lymphatic: No abdominal aortic aneurysm. No abdominal lymphadenopathy Other:  No free fluid. Musculoskeletal: Diffuse abnormal marrow enhancement within the visualized skeleton compatible with known bony metastatic disease. IMPRESSION: 1. Innumerable liver metastases ranging in size from 3-4 mm up to approximately 2 cm. 2. Widespread bony metastatic involvement. 3. Bosniak II cyst left kidney. Electronically Signed   By: Misty Stanley M.D.   On: 10/31/2017 14:11    Medications: I have reviewed the patient's current medications.   Assessment/Plan: 1. Multicentric invasive lobular carcinoma of the right breast diagnosed in December of 1999, a right mastectomy followed by adjuvant [facility] chemotherapy, 5 years of tamoxifen, and 5 years of Femara. The Femara was completed in June of 2010. The right breast mass excision in January 2000 was ER positive, PR positive, and HER-2 negative  2. Pain at the left groin area with a CT of the pelvis on 02/07/12 confirming a destructive lytic lesion at the left pubic symphysis, status post a CT-guided biopsy on 02/16/12 confirming metastatic carcinoma, focally ER positive .  -PET scan 03/07/2012 confirmed multiple hypermetabolic bone metastases with no other evidence of metastatic disease  -Status post palliative radiation to the pelvis beginning on 03/12/2012  -Initiation of Arimidex on 03/27/2012 -initiation of every three-month xgeva on 04/04/2012  -PET scan September 14 [id number] new hypermetabolic bone lesions  -Initiation of Faslodex 09/27/2012  -Restaging PET scan 04/08/2013 with evidence of disease progression in the bones  -Initiation of Aromasin/afinitor 05/29/2013  -Iliac biopsy at [facility] 05/22/2013 confirming HER-2 negative metastatic breast  -PET scan 11/18/2013 with a decrease in hypermetabolic activity  -PET scan 10/08/2014 with progressive diffuse hypermetabolic bone metastases -Initiation of Palbociclib 10/20/2014;  Faslodex 10/23/2014. -Palbociclib placed on hold beginning 11/07/2014 due to neutropenia. -Palbociclib resumed 11/27/2014 on an every other day schedule -Palbociclib resumed 01/04/2015 at a reduced dose of 100 mg daily for 21 days. -Palbociclib beginning 02/12/2015 at a dose of 100 mg daily for 14 days on/14 days off. -PET scan 05/18/2015 with diffuse hypermetabolic bone lesions, increased metabolic activity compared to the PET scan from January 2016 -Palbociclib resumed 06/07/2015, Faslodex continued 06/12/2015 -PET scan 09/22/2015 with diffuse hypermetabolic bone lesions, increased metabolic activity compared to the PET scan 05/18/2015 -Continuation of palbociclib and Faslodex -palbociclib and Faslodex discontinued April 2017 secondary to a C7 spinous process fracture and hypercalcemia -PET scan 35/46/[id number] hypermetabolic bone metastases, with increased metabolic activity interpreted as consistent with disease progression - Xeloda (7 days on/7 days off) started 02/28/2016 -PET scan 08/31/2016-marked improvement in hypermetabolic bone activity, no extra osseous disease -Xeloda continued -PET scan 06/05/2017-progression of bone metastase including a T3 lesion with paraspinal soft tissue, destructive lesion at T8 and progressive lesions at the shoulders, ribs, sternum, and pelvis -Xeloda discontinued 06/08/2017 -Initiation of weekly Taxol 06/26/2017, carboplatin added with week 3 07/10/2017, last Taxol/carboplatin given 10/02/2017 -PET scan 10/10/2017, development of rounded liver lesions in the setting of fatty liver with SUV above background suspicious for metastases, decreased as you be associated with multiple bone lesions -MRI liver 10/31/2017-diffuse liver metastases  3. BRCA1 variant felt to be a polymorphism  4. Pain/tenderness at the left low anterolateral chest wall-she completed palliative radiation on 05/28/2012 with improvement in the pain. She was also treated with radiation to  the thoracic spine.  5. Anemia/leukopenia-likely secondary to breast cancer involving the bone marrow and radiation /afinitor -the anemia is improved  6. History of mildly elevated liver enzymes-? Related to afinitor,? Secondary to metastatic breast cancer 7. Neutropenia secondary to Palbociclib. Improved. 8. Pain at the left shoulder and mid back-a plain x-ray of the left shoulder 06/12/2015 revealed a sclerotic density at the left scapula adjacent to the glenoid, there is metastatic disease involving the humeral head and left scapula on 05/18/2015. She completed a course of radiation to the left shoulder on 08/05/2015. 9. Low neck/upper back pain 12/24/2015 with MRI showing a mildly displaced acute-appearing C7 spinous process fracture; widespread osseous metastatic disease; no evidence of epidural tumor or spinal cord compression. - Completed palliative radiation to the cervical spine 02/17/2016 10. Dysphagia/odynophagia secondary to radiation-resolved 11. Xeloda-induced skin rash 12. Hypercalcemia secondary to #1. Status post Zometa 06/08/2017, 06/19/2017, and 07/03/2017.Pamidronate 06/26/2017.Calcitonin 07/03/2017, 07/04/2017, 07/06/2017; improved 07/07/2017; IV fluids and Xgeva-last given 09/26/2017 13.Nondisplaced left humerus fracture-likely a pathologic fracture, followed by orthopedics   Disposition: Stacy Horn has metastatic breast cancer.  The liver MRI confirms extensive liver metastases.  She has clinical and radiologic evidence of disease progression.  I discussed treatment options with Stacy Horn and her husband.  I contacted Dr. Dees.  Stacy Horn is not currently eligible for a clinical trial at [facility].  We discussed comfort care versus a trial of salvage chemotherapy.  She would like to continue systemic therapy despite the small chance of a clinical response.  We discussed CMF, liposomal doxorubicin, and eribulin.  We discussed the potential toxicities associated with these  regimens.  We decided to begin a trial of CMF on a day 1 every 3-week schedule.  I reviewed the potential toxicities associated with CMF including the chance for nausea, mucositis, hematologic toxicity, diarrhea, and hepatic injury.  She agrees to proceed.  She will be scheduled for a transfusion with packed red blood cells and a first cycle of CMF on 11/03/2017.  The tachycardia is likely secondary to the systemic tumor burden, anemia, and deconditioning.  I have a low clinical suspicion for a pulmonary embolism.  Stacy Horn will return for an office visit on 11/09/2017.  40 minutes were spent with the patient today.  The majority of the time was used for counseling and coordination of care.   , MD  10/31/2017  5:13 PM   

## 2017-10-31 NOTE — Progress Notes (Signed)
DISCONTINUE ON PATHWAY REGIMEN - Breast     A cycle is every 28 days (3 weeks on and 1 week off):     Paclitaxel   **Always confirm dose/schedule in your pharmacy ordering system**    REASON: Disease Progression PRIOR TREATMENT: [id number]: Paclitaxel 80 mg/m2 D1, 8, 15 q28 Days Until Progression or Unacceptable Toxicity TREATMENT RESPONSE: Progressive Disease (PD)  START OFF PATHWAY REGIMEN - Breast   OFF00972:CMF (IV cyclophosphamide) q21 days:   A cycle is every 21 days:     Cyclophosphamide      Methotrexate      5-Fluorouracil   **Always confirm dose/schedule in your pharmacy ordering system**    Patient Characteristics: Distant Metastases or Locoregional Recurrent Disease - Unresected, HER2 Negative/Unknown/Equivocal, ER Positive, Chemotherapy, Third Line, No Prior Anthracycline Therapeutic Status: Distant Metastases BRCA Mutation Status: Absent ER Status: Positive (+) HER2 Status: Negative (-) Would you be surprised if this patient died  in the next year<= I would NOT be surprised if this patient died in the next year PR Status: Positive (+) Line of therapy: Third Line Intent of Therapy: Non-Curative / Palliative Intent, Discussed with Patient

## 2017-10-31 NOTE — Telephone Encounter (Signed)
Scheduled appt per 2/12 los - unable to schedule appt for treatment on 2/15 due to availability in the treatment area. - logged - will contact patient when appts scheduled.

## 2017-10-31 NOTE — Telephone Encounter (Signed)
Spoke  with MRI at [facility].   Will put in order for STAT read today.

## 2017-11-01 ENCOUNTER — Telehealth: Payer: Self-pay | Admitting: Oncology

## 2017-11-01 ENCOUNTER — Other Ambulatory Visit: Payer: Self-pay

## 2017-11-01 DIAGNOSIS — C7951 Secondary malignant neoplasm of bone: Principal | ICD-10-CM

## 2017-11-01 DIAGNOSIS — C50919 Malignant neoplasm of unspecified site of unspecified female breast: Secondary | ICD-10-CM

## 2017-11-01 MED ORDER — HYDROMORPHONE HCL 4 MG PO TABS: 8.0000 mg | ORAL_TABLET | ORAL | 0 refills | Status: DC | PRN

## 2017-11-01 NOTE — Telephone Encounter (Signed)
Scheduled appt per 2/13 sch message and 2/12 los.

## 2017-11-02 ENCOUNTER — Other Ambulatory Visit: Payer: Self-pay | Admitting: Oncology

## 2017-11-03 ENCOUNTER — Encounter: Payer: Self-pay | Admitting: Nurse Practitioner

## 2017-11-03 ENCOUNTER — Inpatient Hospital Stay: Payer: Medicare Other

## 2017-11-03 VITALS — BP 113/76 | HR 99 | Temp 98.3°F | Resp 18

## 2017-11-03 DIAGNOSIS — Z9011 Acquired absence of right breast and nipple: Secondary | ICD-10-CM | POA: Diagnosis not present

## 2017-11-03 DIAGNOSIS — Z5111 Encounter for antineoplastic chemotherapy: Secondary | ICD-10-CM | POA: Diagnosis not present

## 2017-11-03 DIAGNOSIS — C7951 Secondary malignant neoplasm of bone: Secondary | ICD-10-CM | POA: Diagnosis not present

## 2017-11-03 DIAGNOSIS — C50919 Malignant neoplasm of unspecified site of unspecified female breast: Secondary | ICD-10-CM

## 2017-11-03 DIAGNOSIS — C50911 Malignant neoplasm of unspecified site of right female breast: Secondary | ICD-10-CM | POA: Diagnosis not present

## 2017-11-03 DIAGNOSIS — Z170 Estrogen receptor positive status [ER+]: Secondary | ICD-10-CM | POA: Diagnosis not present

## 2017-11-03 DIAGNOSIS — C787 Secondary malignant neoplasm of liver and intrahepatic bile duct: Secondary | ICD-10-CM | POA: Diagnosis not present

## 2017-11-03 LAB — CBC WITH DIFFERENTIAL (CANCER CENTER ONLY)
Basophils Absolute: 0 10*3/uL (ref 0.0–0.1)
Basophils Relative: 0 %
EOS PCT: 0 %
Eosinophils Absolute: 0 10*3/uL (ref 0.0–0.5)
HEMATOCRIT: 26.9 % — AB (ref 34.8–46.6)
HEMOGLOBIN: 8.6 g/dL — AB (ref 11.6–15.9)
LYMPHS PCT: 17 %
Lymphs Abs: 0.8 10*3/uL — ABNORMAL LOW (ref 0.9–3.3)
MCH: 30.5 pg (ref 25.1–34.0)
MCHC: 32 g/dL (ref 31.5–36.0)
MCV: 95.4 fL (ref 79.5–101.0)
MONO ABS: 0.4 10*3/uL (ref 0.1–0.9)
MONOS PCT: 9 %
NEUTROS ABS: 3.4 10*3/uL (ref 1.5–6.5)
Neutrophils Relative %: 74 %
Platelet Count: 122 10*3/uL — ABNORMAL LOW (ref 145–400)
RBC: 2.82 MIL/uL — ABNORMAL LOW (ref 3.70–5.45)
RDW: 17.7 % — ABNORMAL HIGH (ref 11.2–14.5)
WBC Count: 4.6 10*3/uL (ref 3.9–10.3)
nRBC: 2 /100 WBC — ABNORMAL HIGH

## 2017-11-03 LAB — SAMPLE TO BLOOD BANK

## 2017-11-03 LAB — CMP (CANCER CENTER ONLY)
ALBUMIN: 3.4 g/dL — AB (ref 3.5–5.0)
ALT: 76 U/L — ABNORMAL HIGH (ref 0–55)
AST: 172 U/L — AB (ref 5–34)
Alkaline Phosphatase: 202 U/L — ABNORMAL HIGH (ref 40–150)
Anion gap: 10 (ref 3–11)
BILIRUBIN TOTAL: 0.9 mg/dL (ref 0.2–1.2)
BUN: 11 mg/dL (ref 7–26)
CHLORIDE: 107 mmol/L (ref 98–109)
CO2: 21 mmol/L — ABNORMAL LOW (ref 22–29)
CREATININE: 0.78 mg/dL (ref 0.60–1.10)
Calcium: 13.5 mg/dL (ref 8.4–10.4)
GFR, Est AFR Am: >60 mL/min (ref >60)
GFR, Estimated: >60 mL/min (ref >60)
GLUCOSE: 112 mg/dL (ref 70–140)
POTASSIUM: 3.9 mmol/L (ref 3.5–5.1)
Sodium: 138 mmol/L (ref 136–145)
TOTAL PROTEIN: 7.6 g/dL (ref 6.4–8.3)

## 2017-11-03 LAB — PREPARE RBC (CROSSMATCH)

## 2017-11-03 LAB — ABO/RH: ABO/RH(D): O NEG

## 2017-11-03 MED ORDER — HEPARIN SOD (PORK) LOCK FLUSH 100 UNIT/ML IV SOLN
500.0000 [IU] | Freq: Once | INTRAVENOUS | Status: AC | PRN
  Administered 2017-11-03: 500 [IU]
  Filled 2017-11-03: qty 5

## 2017-11-03 MED ORDER — DEXAMETHASONE SODIUM PHOSPHATE 10 MG/ML IJ SOLN
10.0000 mg | Freq: Once | INTRAMUSCULAR | Status: AC
  Administered 2017-11-03: 10 mg via INTRAVENOUS

## 2017-11-03 MED ORDER — PALONOSETRON HCL INJECTION 0.25 MG/5ML
0.2500 mg | Freq: Once | INTRAVENOUS | Status: AC
  Administered 2017-11-03: 0.25 mg via INTRAVENOUS

## 2017-11-03 MED ORDER — METHOTREXATE SODIUM (PF) CHEMO INJECTION 250 MG/10ML
28.5000 mg/m2 | Freq: Once | INTRAMUSCULAR | Status: AC
  Administered 2017-11-03: 50 mg via INTRAVENOUS
  Filled 2017-11-03: qty 2

## 2017-11-03 MED ORDER — SODIUM CHLORIDE 0.9 % IV SOLN
250.0000 mL | Freq: Once | INTRAVENOUS | Status: AC
  Administered 2017-11-03: 250 mL via INTRAVENOUS

## 2017-11-03 MED ORDER — DEXAMETHASONE SODIUM PHOSPHATE 10 MG/ML IJ SOLN
INTRAMUSCULAR | Status: AC
  Filled 2017-11-03: qty 1

## 2017-11-03 MED ORDER — SODIUM CHLORIDE 0.9% FLUSH
10.0000 mL | INTRAVENOUS | Status: DC | PRN
  Administered 2017-11-03: 10 mL
  Filled 2017-11-03: qty 10

## 2017-11-03 MED ORDER — SODIUM CHLORIDE 0.9 % IV SOLN
400.0000 mg/m2 | Freq: Once | INTRAVENOUS | Status: AC
  Administered 2017-11-03: 700 mg via INTRAVENOUS
  Filled 2017-11-03: qty 35

## 2017-11-03 MED ORDER — PALONOSETRON HCL INJECTION 0.25 MG/5ML
INTRAVENOUS | Status: AC
  Filled 2017-11-03: qty 5

## 2017-11-03 MED ORDER — HYDROMORPHONE HCL 4 MG PO TABS: 8.0000 mg | ORAL_TABLET | ORAL | 0 refills | Status: DC | PRN

## 2017-11-03 MED ORDER — FLUOROURACIL CHEMO INJECTION 2.5 GM/50ML
400.0000 mg/m2 | Freq: Once | INTRAVENOUS | Status: AC
  Administered 2017-11-03: 700 mg via INTRAVENOUS
  Filled 2017-11-03: qty 14

## 2017-11-03 MED ORDER — SODIUM CHLORIDE 0.9 % IV SOLN
Freq: Once | INTRAVENOUS | Status: AC
  Administered 2017-11-03: 10:00:00 via INTRAVENOUS

## 2017-11-03 NOTE — Progress Notes (Signed)
Panic calcium of 13.5 and AST of 172. Dr. Benay Spice made aware and, per MD, pt ok to treat if cleared by pharmacy. Per Eliezer Lofts in pharmacy pt ok to treat with AST of 172 with methotrexate dosage. Made infusion RN aware.

## 2017-11-03 NOTE — Progress Notes (Signed)
One unit of PRBC's started at 1223 and ended at 1450 which would indicate that the patient received 470 mLs of PRBC's.  Half way through infusion it was noted that the patients saline may have not been completely clamped off.  Extra documented volume likely due to saline.  Patient tolerated infusion well with no complications.

## 2017-11-03 NOTE — Patient Instructions (Signed)
[facility] Discharge Instructions for Patients Receiving Chemotherapy  Today you received the following chemotherapy agents Cytoxan, Methotrexate, and 5FU  To help prevent nausea and vomiting after your treatment, we encourage you to take your nausea medication as directed   If you develop nausea and vomiting that is not controlled by your nausea medication, call the clinic.   BELOW ARE SYMPTOMS THAT SHOULD BE REPORTED IMMEDIATELY:  *FEVER GREATER THAN 100.5 F  *CHILLS WITH OR WITHOUT FEVER  NAUSEA AND VOMITING THAT IS NOT CONTROLLED WITH YOUR NAUSEA MEDICATION  *UNUSUAL SHORTNESS OF BREATH  *UNUSUAL BRUISING OR BLEEDING  TENDERNESS IN MOUTH AND THROAT WITH OR WITHOUT PRESENCE OF ULCERS  *URINARY PROBLEMS  *BOWEL PROBLEMS  UNUSUAL RASH Items with * indicate a potential emergency and should be followed up as soon as possible.  Feel free to call the clinic should you have any questions or concerns. The clinic phone number is [phone].  Please show the [facility] at check-in to the Emergency Department and triage nurse.   Cyclophosphamide (Cytoxan) injection What is this medicine? CYCLOPHOSPHAMIDE (sye kloe FOSS fa mide) is a chemotherapy drug. It slows the growth of cancer cells. This medicine is used to treat many types of cancer like lymphoma, myeloma, leukemia, breast cancer, and ovarian cancer, to name a few. This medicine may be used for other purposes; ask your health care provider or pharmacist if you have questions. COMMON BRAND NAME(S): Cytoxan, Neosar What should I tell my health care provider before I take this medicine? They need to know if you have any of these conditions: -blood disorders -history of other chemotherapy -infection -kidney disease -liver disease -recent or ongoing radiation therapy -tumors in the bone marrow -an unusual or allergic reaction to cyclophosphamide, other chemotherapy, other medicines, foods, dyes, or  preservatives -pregnant or trying to get pregnant -breast-feeding How should I use this medicine? This drug is usually given as an injection into a vein or muscle or by infusion into a vein. It is administered in a hospital or clinic by a specially trained health care professional. Talk to your pediatrician regarding the use of this medicine in children. Special care may be needed. Overdosage: If you think you have taken too much of this medicine contact a poison control center or emergency room at once. NOTE: This medicine is only for you. Do not share this medicine with others. What if I miss a dose? It is important not to miss your dose. Call your doctor or health care professional if you are unable to keep an appointment. What may interact with this medicine? This medicine may interact with the following medications: -amiodarone -amphotericin B -azathioprine -certain antiviral medicines for HIV or AIDS such as protease inhibitors (e.g., indinavir, ritonavir) and zidovudine -certain blood pressure medications such as benazepril, captopril, enalapril, fosinopril, lisinopril, moexipril, monopril, perindopril, quinapril, ramipril, trandolapril -certain cancer medications such as anthracyclines (e.g., daunorubicin, doxorubicin), busulfan, cytarabine, paclitaxel, pentostatin, tamoxifen, trastuzumab -certain diuretics such as chlorothiazide, chlorthalidone, hydrochlorothiazide, indapamide, metolazone -certain medicines that treat or prevent blood clots like warfarin -certain muscle relaxants such as succinylcholine -cyclosporine -etanercept -indomethacin -medicines to increase blood counts like filgrastim, pegfilgrastim, sargramostim -medicines used as general anesthesia -metronidazole -natalizumab This list may not describe all possible interactions. Give your health care provider a list of all the medicines, herbs, non-prescription drugs, or dietary supplements you use. Also tell them if  you smoke, drink alcohol, or use illegal drugs. Some items may interact with your medicine. What  for while using this medicine? Visit your doctor for checks on your progress. This drug may make you feel generally unwell. This is not uncommon, as chemotherapy can affect healthy cells as well as cancer cells. Report any side effects. Continue your course of treatment even though you feel ill unless your doctor tells you to stop. Drink water or other fluids as directed. Urinate often, even at night. In some cases, you may be given additional medicines to help with side effects. Follow all directions for their use. Call your doctor or health care professional for advice if you get a fever, chills or sore throat, or other symptoms of a cold or flu. Do not treat yourself. This drug decreases your body's ability to fight infections. Try to avoid being around people who are sick. This medicine may increase your risk to bruise or bleed. Call your doctor or health care professional if you notice any unusual bleeding. Be careful brushing and flossing your teeth or using a toothpick because you may get an infection or bleed more easily. If you have any dental work done, tell your dentist you are receiving this medicine. You may get drowsy or dizzy. Do not drive, use machinery, or do anything that needs mental alertness until you know how this medicine affects you. Do not become pregnant while taking this medicine or for 1 year after stopping it. Women should inform their doctor if they wish to become pregnant or think they might be pregnant. Men should not father a child while taking this medicine and for 4 months after stopping it. There is a potential for serious side effects to an unborn child. Talk to your health care professional or pharmacist for more information. Do not breast-feed an infant while taking this medicine. This medicine may interfere with the ability to have a child. This medicine  has caused ovarian failure in some women. This medicine has caused reduced sperm counts in some men. You should talk with your doctor or health care professional if you are concerned about your fertility. If you are going to have surgery, tell your doctor or health care professional that you have taken this medicine. What side effects may I notice from receiving this medicine? Side effects that you should report to your doctor or health care professional as soon as possible: -allergic reactions like skin rash, itching or hives, swelling of the face, lips, or tongue -low blood counts - this medicine may decrease the number of white blood cells, red blood cells and platelets. You may be at increased risk for infections and bleeding. -signs of infection - fever or chills, cough, sore throat, pain or difficulty passing urine -signs of decreased platelets or bleeding - bruising, pinpoint red spots on the skin, black, tarry stools, blood in the urine -signs of decreased red blood cells - unusually weak or tired, fainting spells, lightheadedness -breathing problems -dark urine -dizziness -palpitations -swelling of the ankles, feet, hands -trouble passing urine or change in the amount of urine -weight gain -yellowing of the eyes or skin Side effects that usually do not require medical attention (report to your doctor or health care professional if they continue or are bothersome): -changes in nail or skin color -hair loss -missed menstrual periods -mouth sores -nausea, vomiting This list may not describe all possible side effects. Call your doctor for medical advice about side effects. You may report side effects to FDA at 1-800-FDA-1088. Where should I keep my medicine? This drug is given in a hospital   or clinic and will not be stored at home. NOTE: This sheet is a summary. It may not cover all possible information. If you have questions about this medicine, talk to your doctor, pharmacist, or  health care provider.  2018 [employer]vier/Gold Standard (2012-07-20 16:22:58)   Methotrexate injection What is this medicine? METHOTREXATE (METH oh TREX ate) is a chemotherapy drug used to treat cancer including breast cancer, leukemia, and lymphoma. This medicine can also be used to treat psoriasis and certain kinds of arthritis. This medicine may be used for other purposes; ask your health care provider or pharmacist if you have questions. What should I tell my health care provider before I take this medicine? They need to know if you have any of these conditions: -fluid in the stomach area or lungs -if you often drink alcohol -infection or immune system problems -kidney disease -liver disease -low blood counts, like low white cell, platelet, or red cell counts -lung disease -radiation therapy -stomach ulcers -ulcerative colitis -an unusual or allergic reaction to methotrexate, other medicines, foods, dyes, or preservatives -pregnant or trying to get pregnant -breast-feeding How should I use this medicine? This medicine is for infusion into a vein or for injection into muscle or into the spinal fluid (whichever applies). It is usually given by a health care professional in a hospital or clinic setting. In rare cases, you might get this medicine at home. You will be taught how to give this medicine. Use exactly as directed. Take your medicine at regular intervals. Do not take your medicine more often than directed. If this medicine is used for arthritis or psoriasis, it should be taken weekly, NOT daily. It is important that you put your used needles and syringes in a special sharps container. Do not put them in a trash can. If you do not have a sharps container, call your pharmacist or healthcare provider to get one. Talk to your pediatrician regarding the use of this medicine in children. While this drug may be prescribed for children as young as 2 years for selected conditions,  precautions do apply. Overdosage: If you think you have taken too much of this medicine contact a poison control center or emergency room at once. NOTE: This medicine is only for you. Do not share this medicine with others. What if I miss a dose? It is important not to miss your dose. Call your doctor or health care professional if you are unable to keep an appointment. If you give yourself the medicine and you miss a dose, talk with your doctor or health care professional. Do not take double or extra doses. What may interact with this medicine? This medicine may interact with the following medications: -acitretin -aspirin or aspirin-like medicines including salicylates -azathioprine -certain antibiotics like chloramphenicol, penicillin, tetracycline -certain medicines for stomach problems like esomeprazole, omeprazole, pantoprazole -cyclosporine -gold -hydroxychloroquine -live virus vaccines -mercaptopurine -NSAIDs, medicines for pain and inflammation, like ibuprofen or naproxen -other cytotoxic agents -penicillamine -phenylbutazone -phenytoin -probenacid -retinoids such as isotretinoin and tretinoin -steroid medicines like prednisone or cortisone -sulfonamides like sulfasalazine and trimethoprim/sulfamethoxazole -theophylline This list may not describe all possible interactions. Give your health care provider a list of all the medicines, herbs, non-prescription drugs, or dietary supplements you use. Also tell them if you smoke, drink alcohol, or use illegal drugs. Some items may interact with your medicine. What should I watch for while using this medicine? Avoid alcoholic drinks. In some cases, you may be given additional medicines to help with side effects.   Follow all directions for their use. This medicine can make you more sensitive to the sun. Keep out of the sun. If you cannot avoid being in the sun, wear protective clothing and use sunscreen. Do not use sun lamps or tanning  beds/booths. You may get drowsy or dizzy. Do not drive, use machinery, or do anything that needs mental alertness until you know how this medicine affects you. Do not stand or sit up quickly, especially if you are an older patient. This reduces the risk of dizzy or fainting spells. You may need blood work done while you are taking this medicine. Call your doctor or health care professional for advice if you get a fever, chills or sore throat, or other symptoms of a cold or flu. Do not treat yourself. This drug decreases your body's ability to fight infections. Try to avoid being around people who are sick. This medicine may increase your risk to bruise or bleed. Call your doctor or health care professional if you notice any unusual bleeding. Check with your doctor or health care professional if you get an attack of severe diarrhea, nausea and vomiting, or if you sweat a lot. The loss of too much body fluid can make it dangerous for you to take this medicine. Talk to your doctor about your risk of cancer. You may be more at risk for certain types of cancers if you take this medicine. Both men and women must use effective birth control with this medicine. Do not become pregnant while taking this medicine or until at least 1 normal menstrual cycle has occurred after stopping it. Women should inform their doctor if they wish to become pregnant or think they might be pregnant. Men should not father a child while taking this medicine and for 3 months after stopping it. There is a potential for serious side effects to an unborn child. Talk to your health care professional or pharmacist for more information. Do not breast-feed an infant while taking this medicine. What side effects may I notice from receiving this medicine? Side effects that you should report to your doctor or health care professional as soon as possible: -allergic reactions like skin rash, itching or hives, swelling of the face, lips, or  tongue -back pain -breathing problems or shortness of breath -confusion -diarrhea -dry, nonproductive cough -low blood counts - this medicine may decrease the number of white blood cells, red blood cells and platelets. You may be at increased risk of infections and bleeding -mouth sores -redness, blistering, peeling or loosening of the skin, including inside the mouth -seizures -severe headaches -signs of infection - fever or chills, cough, sore throat, pain or difficulty passing urine -signs and symptoms of bleeding such as bloody or black, tarry stools; red or dark-brown urine; spitting up blood or brown material that looks like coffee grounds; red spots on the skin; unusual bruising or bleeding from the eye, gums, or nose -signs and symptoms of kidney injury like trouble passing urine or change in the amount of urine -signs and symptoms of liver injury like dark yellow or brown urine; general ill feeling or flu-like symptoms; light-colored stools; loss of appetite; nausea; right upper belly pain; unusually weak or tired; yellowing of the eyes or skin -stiff neck -vomiting Side effects that usually do not require medical attention (report to your doctor or health care professional if they continue or are bothersome): -dizziness -hair loss -headache -stomach pain -upset stomach This list may not describe all possible side effects. Call   effects. Call your doctor for medical advice about side effects. You may report side effects to FDA at 1-800-FDA-1088. Where should I keep my medicine? If you are using this medicine at home, you will be instructed on how to store this medicine. Throw away any unused medicine after the expiration date on the label. NOTE: This sheet is a summary. It may not cover all possible information. If you have questions about this medicine, talk to your doctor, pharmacist, or health care provider.  2018 [employer]/Gold Standard (2014-12-25 12:36:41)  Fluorouracil, 5-FU  injection What is this medicine? FLUOROURACIL, 5-FU (flure oh YOOR a sil) is a chemotherapy drug. It slows the growth of cancer cells. This medicine is used to treat many types of cancer like breast cancer, colon or rectal cancer, pancreatic cancer, and stomach cancer. This medicine may be used for other purposes; ask your health care provider or pharmacist if you have questions. COMMON BRAND NAME(S): Adrucil What should I tell my health care provider before I take this medicine? They need to know if you have any of these conditions: -blood disorders -dihydropyrimidine dehydrogenase (DPD) deficiency -infection (especially a virus infection such as chickenpox, cold sores, or herpes) -kidney disease -liver disease -malnourished, poor nutrition -recent or ongoing radiation therapy -an unusual or allergic reaction to fluorouracil, other chemotherapy, other medicines, foods, dyes, or preservatives -pregnant or trying to get pregnant -breast-feeding How should I use this medicine? This drug is given as an infusion or injection into a vein. It is administered in a hospital or clinic by a specially trained health care professional. Talk to your pediatrician regarding the use of this medicine in children. Special care may be needed. Overdosage: If you think you have taken too much of this medicine contact a poison control center or emergency room at once. NOTE: This medicine is only for you. Do not share this medicine with others. What if I miss a dose? It is important not to miss your dose. Call your doctor or health care professional if you are unable to keep an appointment. What may interact with this medicine? -allopurinol -cimetidine -dapsone -digoxin -hydroxyurea -leucovorin -levamisole -medicines for seizures like ethotoin, fosphenytoin, phenytoin -medicines to increase blood counts like filgrastim, pegfilgrastim, sargramostim -medicines that treat or prevent blood clots like  warfarin, enoxaparin, and dalteparin -methotrexate -metronidazole -pyrimethamine -some other chemotherapy drugs like busulfan, cisplatin, estramustine, vinblastine -trimethoprim -trimetrexate -vaccines Talk to your doctor or health care professional before taking any of these medicines: -acetaminophen -aspirin -ibuprofen -ketoprofen -naproxen This list may not describe all possible interactions. Give your health care provider a list of all the medicines, herbs, non-prescription drugs, or dietary supplements you use. Also tell them if you smoke, drink alcohol, or use illegal drugs. Some items may interact with your medicine. What should I watch for while using this medicine? Visit your doctor for checks on your progress. This drug may make you feel generally unwell. This is not uncommon, as chemotherapy can affect healthy cells as well as cancer cells. Report any side effects. Continue your course of treatment even though you feel ill unless your doctor tells you to stop. In some cases, you may be given additional medicines to help with side effects. Follow all directions for their use. Call your doctor or health care professional for advice if you get a fever, chills or sore throat, or other symptoms of a cold or flu. Do not treat yourself. This drug decreases your body's ability to fight infections. Try to avoid being around  people who are sick. This medicine may increase your risk to bruise or bleed. Call your doctor or health care professional if you notice any unusual bleeding. Be careful brushing and flossing your teeth or using a toothpick because you may get an infection or bleed more easily. If you have any dental work done, tell your dentist you are receiving this medicine. Avoid taking products that contain aspirin, acetaminophen, ibuprofen, naproxen, or ketoprofen unless instructed by your doctor. These medicines may hide a fever. Do not become pregnant while taking this medicine.  Women should inform their doctor if they wish to become pregnant or think they might be pregnant. There is a potential for serious side effects to an unborn child. Talk to your health care professional or pharmacist for more information. Do not breast-feed an infant while taking this medicine. Men should inform their doctor if they wish to father a child. This medicine may lower sperm counts. Do not treat diarrhea with over the counter products. Contact your doctor if you have diarrhea that lasts more than 2 days or if it is severe and watery. This medicine can make you more sensitive to the sun. Keep out of the sun. If you cannot avoid being in the sun, wear protective clothing and use sunscreen. Do not use sun lamps or tanning beds/booths. What side effects may I notice from receiving this medicine? Side effects that you should report to your doctor or health care professional as soon as possible: -allergic reactions like skin rash, itching or hives, swelling of the face, lips, or tongue -low blood counts - this medicine may decrease the number of white blood cells, red blood cells and platelets. You may be at increased risk for infections and bleeding. -signs of infection - fever or chills, cough, sore throat, pain or difficulty passing urine -signs of decreased platelets or bleeding - bruising, pinpoint red spots on the skin, black, tarry stools, blood in the urine -signs of decreased red blood cells - unusually weak or tired, fainting spells, lightheadedness -breathing problems -changes in vision -chest pain -mouth sores -nausea and vomiting -pain, swelling, redness at site where injected -pain, tingling, numbness in the hands or feet -redness, swelling, or sores on hands or feet -stomach pain -unusual bleeding Side effects that usually do not require medical attention (report to your doctor or health care professional if they continue or are bothersome): -changes in finger or toe  nails -diarrhea -dry or itchy skin -hair loss -headache -loss of appetite -sensitivity of eyes to the light -stomach upset -unusually teary eyes This list may not describe all possible side effects. Call your doctor for medical advice about side effects. You may report side effects to FDA at 1-800-FDA-1088. Where should I keep my medicine? This drug is given in a hospital or clinic and will not be stored at home. NOTE: This sheet is a summary. It may not cover all possible information. If you have questions about this medicine, talk to your doctor, pharmacist, or health care provider.  2018 [employer]/Gold Standard (2008-01-09 13:53:16)    Blood Transfusion, Care After This sheet gives you information about how to care for yourself after your procedure. Your doctor may also give you more specific instructions. If you have problems or questions, contact your doctor. Follow these instructions at home:  Take over-the-counter and prescription medicines only as told by your doctor.  Go back to your normal activities as told by your doctor.  Follow instructions from your doctor about how to  take care of the area where an IV tube was put into your vein (insertion site). Make sure you: ? Wash your hands with soap and water before you change your bandage (dressing). If there is no soap and water, use hand sanitizer. ? Change your bandage as told by your doctor.  Check your IV insertion site every day for signs of infection. Check for: ? More redness, swelling, or pain. ? More fluid or blood. ? Warmth. ? Pus or a bad smell. Contact a doctor if:  You have more redness, swelling, or pain around the IV insertion site..  You have more fluid or blood coming from the IV insertion site.  Your IV insertion site feels warm to the touch.  You have pus or a bad smell coming from the IV insertion site.  Your pee (urine) turns pink, red, or brown.  You feel weak after doing your normal  activities. Get help right away if:  You have signs of a serious allergic or body defense (immune) system reaction, including: ? Itchiness. ? Hives. ? Trouble breathing. ? Anxiety. ? Pain in your chest or lower back. ? Fever, flushing, and chills. ? Fast pulse. ? Rash. ? Watery poop (diarrhea). ? Throwing up (vomiting). ? Dark pee. ? Serious headache. ? Dizziness. ? Stiff neck. ? Yellow color in your face or the white parts of your eyes (jaundice). Summary  After a blood transfusion, return to your normal activities as told by your doctor.  Every day, check for signs of infection where the IV tube was put into your vein.  Some signs of infection are warm skin, more redness and pain, more fluid or blood, and pus or a bad smell where the needle went in.  Contact your doctor if you feel weak or have any unusual symptoms. This information is not intended to replace advice given to you by your health care provider. Make sure you discuss any questions you have with your health care provider. Document Released: 09/26/2014 Document Revised: 04/29/2016 Document Reviewed: 04/29/2016 Elsevier Interactive Patient Education  2017 [employer].

## 2017-11-04 LAB — TYPE AND SCREEN
ABO/RH(D): O NEG
ANTIBODY SCREEN: NEGATIVE
UNIT DIVISION: 0

## 2017-11-04 LAB — BPAM RBC
BLOOD PRODUCT EXPIRATION DATE: 201903182359
ISSUE DATE / TIME: 201902151208
UNIT TYPE AND RH: 9500

## 2017-11-07 ENCOUNTER — Telehealth: Payer: Self-pay | Admitting: *Deleted

## 2017-11-07 NOTE — Telephone Encounter (Signed)
TCT patient after 1st treatment of CMF. Spoke with patient. She states she is feeling pretty well. She stated she had some nausea in the mornings but it was relieved with compazine. No other c/o. She verblaized understanding to call with any questions or concerns.

## 2017-11-07 NOTE — Telephone Encounter (Signed)
-----   Message from Murvin Natal, RN sent at 11/03/2017  2:28 PM EST ----- Regarding: First time Chemo Follow up call for Dr. Benay Spice Pt of Dr. Benay Spice, first time chemo follow up for cytoxan, methotrexate, and 5Fu IV push.  Pt tolerated treatment well.

## 2017-11-08 ENCOUNTER — Ambulatory Visit: Payer: Medicare Other | Admitting: Oncology

## 2017-11-08 ENCOUNTER — Other Ambulatory Visit: Payer: Medicare Other

## 2017-11-09 ENCOUNTER — Telehealth: Payer: Self-pay

## 2017-11-09 ENCOUNTER — Inpatient Hospital Stay (HOSPITAL_BASED_OUTPATIENT_CLINIC_OR_DEPARTMENT_OTHER): Payer: Medicare Other | Admitting: Nurse Practitioner

## 2017-11-09 ENCOUNTER — Encounter: Payer: Self-pay | Admitting: Nurse Practitioner

## 2017-11-09 ENCOUNTER — Inpatient Hospital Stay: Payer: Medicare Other

## 2017-11-09 VITALS — BP 103/45 | HR 104 | Temp 98.7°F | Resp 18

## 2017-11-09 DIAGNOSIS — Z170 Estrogen receptor positive status [ER+]: Secondary | ICD-10-CM

## 2017-11-09 DIAGNOSIS — R000 Tachycardia, unspecified: Secondary | ICD-10-CM

## 2017-11-09 DIAGNOSIS — Z9223 Personal history of estrogen therapy: Secondary | ICD-10-CM

## 2017-11-09 DIAGNOSIS — Z79899 Other long term (current) drug therapy: Secondary | ICD-10-CM

## 2017-11-09 DIAGNOSIS — D72819 Decreased white blood cell count, unspecified: Secondary | ICD-10-CM | POA: Diagnosis not present

## 2017-11-09 DIAGNOSIS — C50911 Malignant neoplasm of unspecified site of right female breast: Secondary | ICD-10-CM | POA: Diagnosis not present

## 2017-11-09 DIAGNOSIS — Z923 Personal history of irradiation: Secondary | ICD-10-CM | POA: Diagnosis not present

## 2017-11-09 DIAGNOSIS — G893 Neoplasm related pain (acute) (chronic): Secondary | ICD-10-CM | POA: Diagnosis not present

## 2017-11-09 DIAGNOSIS — R531 Weakness: Secondary | ICD-10-CM

## 2017-11-09 DIAGNOSIS — C7951 Secondary malignant neoplasm of bone: Secondary | ICD-10-CM

## 2017-11-09 DIAGNOSIS — D696 Thrombocytopenia, unspecified: Secondary | ICD-10-CM

## 2017-11-09 DIAGNOSIS — C50919 Malignant neoplasm of unspecified site of unspecified female breast: Secondary | ICD-10-CM

## 2017-11-09 DIAGNOSIS — Z9011 Acquired absence of right breast and nipple: Secondary | ICD-10-CM | POA: Diagnosis not present

## 2017-11-09 DIAGNOSIS — C787 Secondary malignant neoplasm of liver and intrahepatic bile duct: Secondary | ICD-10-CM

## 2017-11-09 DIAGNOSIS — Z9889 Other specified postprocedural states: Secondary | ICD-10-CM

## 2017-11-09 DIAGNOSIS — D649 Anemia, unspecified: Secondary | ICD-10-CM | POA: Diagnosis not present

## 2017-11-09 DIAGNOSIS — Z5111 Encounter for antineoplastic chemotherapy: Secondary | ICD-10-CM | POA: Diagnosis not present

## 2017-11-09 DIAGNOSIS — R110 Nausea: Secondary | ICD-10-CM

## 2017-11-09 LAB — CBC WITH DIFFERENTIAL (CANCER CENTER ONLY)
Basophils Absolute: 0 10*3/uL (ref 0.0–0.1)
Basophils Relative: 0 %
Eosinophils Absolute: 0 10*3/uL (ref 0.0–0.5)
Eosinophils Relative: 1 %
HCT: 29.4 % — ABNORMAL LOW (ref 34.8–46.6)
Hemoglobin: 9.7 g/dL — ABNORMAL LOW (ref 11.6–15.9)
Lymphocytes Relative: 31 %
Lymphs Abs: 0.8 10*3/uL — ABNORMAL LOW (ref 0.9–3.3)
MCH: 30.1 pg (ref 25.1–34.0)
MCHC: 33 g/dL (ref 31.5–36.0)
MCV: 91.3 fL (ref 79.5–101.0)
Monocytes Absolute: 0.1 10*3/uL (ref 0.1–0.9)
Monocytes Relative: 3 %
Neutro Abs: 1.6 10*3/uL (ref 1.5–6.5)
Neutrophils Relative %: 65 %
Platelet Count: 84 10*3/uL — ABNORMAL LOW (ref 145–400)
RBC: 3.22 MIL/uL — ABNORMAL LOW (ref 3.70–5.45)
RDW: 17.5 % — ABNORMAL HIGH (ref 11.2–14.5)
WBC Count: 2.5 10*3/uL — ABNORMAL LOW (ref 3.9–10.3)
nRBC: 2 /100 WBC — ABNORMAL HIGH

## 2017-11-09 LAB — CMP (CANCER CENTER ONLY)
ALBUMIN: 3.3 g/dL — AB (ref 3.5–5.0)
ALT: 50 U/L (ref 0–55)
AST: 81 U/L — AB (ref 5–34)
Alkaline Phosphatase: 173 U/L — ABNORMAL HIGH (ref 40–150)
Anion gap: 9 (ref 3–11)
BILIRUBIN TOTAL: 0.6 mg/dL (ref 0.2–1.2)
BUN: 9 mg/dL (ref 7–26)
CHLORIDE: 109 mmol/L (ref 98–109)
CO2: 20 mmol/L — AB (ref 22–29)
Calcium: 12.8 mg/dL — ABNORMAL HIGH (ref 8.4–10.4)
Creatinine: 0.77 mg/dL (ref 0.60–1.10)
GFR, Est AFR Am: >60 mL/min (ref >60)
GFR, Estimated: >60 mL/min (ref >60)
GLUCOSE: 110 mg/dL (ref 70–140)
POTASSIUM: 4 mmol/L (ref 3.5–5.1)
Sodium: 138 mmol/L (ref 136–145)
TOTAL PROTEIN: 7 g/dL (ref 6.4–8.3)

## 2017-11-09 MED ORDER — SODIUM CHLORIDE 0.9% FLUSH
10.0000 mL | Freq: Once | INTRAVENOUS | Status: AC
  Administered 2017-11-09: 10 mL
  Filled 2017-11-09: qty 10

## 2017-11-09 MED ORDER — ONDANSETRON HCL 8 MG PO TABS: 8.0000 mg | ORAL_TABLET | Freq: Three times a day (TID) | ORAL | 2 refills | Status: AC | PRN

## 2017-11-09 MED ORDER — HEPARIN SOD (PORK) LOCK FLUSH 100 UNIT/ML IV SOLN
500.0000 [IU] | Freq: Once | INTRAVENOUS | Status: AC
  Administered 2017-11-09: 500 [IU]
  Filled 2017-11-09: qty 5

## 2017-11-09 MED ORDER — HYDROMORPHONE HCL 4 MG PO TABS: 8.0000 mg | ORAL_TABLET | ORAL | 0 refills | Status: AC | PRN

## 2017-11-09 NOTE — Progress Notes (Addendum)
[place] OFFICE PROGRESS NOTE   Diagnosis: Breast cancer  INTERVAL HISTORY:   Ms. Sanzo returns as scheduled.  She completed cycle 1 CMF 11/03/2017.  She has mild intermittent nausea.  No vomiting.  She takes a laxative as needed.  No mouth sores.  Pain overall is better.  She continues MS Contin.  She and her husband note she is using less of the hydromorphone.  Objective:  Vital signs in last 24 hours:  Blood pressure (!) 103/45, pulse (!) 104, temperature 98.7 F (37.1 C), temperature source Oral, resp. rate 18, last menstrual period 10/08/2014, SpO2 100 %.    HEENT: No thrush or ulcers. Resp: Lungs clear bilaterally. Cardio: Regular rate and rhythm. GI: Abdomen soft and nontender.  No hepatomegaly. Vascular: No leg edema.  Calves soft and nontender. Neuro: Alert and oriented. Port-A-Cath without erythema.   Lab Results:  Lab Results  Component Value Date   WBC 2.5 (L) 11/09/2017   HGB 8.2 (L) 10/11/2017   HCT 29.4 (L) 11/09/2017   MCV 91.3 11/09/2017   PLT 84 (L) 11/09/2017   NEUTROABS 1.6 11/09/2017    Imaging:  No results found.  Medications: I have reviewed the patient's current medications.  Assessment/Plan: 1. Multicentric invasive lobular carcinoma of the right breast diagnosed in December of 1999, a right mastectomy followed by adjuvant [facility] chemotherapy, 5 years of tamoxifen, and 5 years of Femara. The Femara was completed in June of 2010. The right breast mass excision in January 2000 was ER positive, PR positive, and HER-2 negative  2. Pain at the left groin area with a CT of the pelvis on 02/07/12 confirming a destructive lytic lesion at the left pubic symphysis, status post a CT-guided biopsy on 02/16/12 confirming metastatic carcinoma, focally ER positive .  -PET scan 03/07/2012 confirmed multiple hypermetabolic bone metastases with no other evidence of metastatic disease  -Status post palliative radiation to the pelvis beginning on  03/12/2012  -Initiation of Arimidex on 03/27/2012 -initiation of every three-month xgeva on 04/04/2012  -PET scan September 15 [id number] new hypermetabolic bone lesions  -Initiation of Faslodex 09/27/2012  -Restaging PET scan 04/08/2013 with evidence of disease progression in the bones  -Initiation of Aromasin/afinitor 05/29/2013  -Iliac biopsy at [facility] 05/22/2013 confirming HER-2 negative metastatic breast  -PET scan 11/18/2013 with a decrease in hypermetabolic activity  -PET scan 10/08/2014 with progressive diffuse hypermetabolic bone metastases -Initiation of Palbociclib 10/20/2014; Faslodex 10/23/2014. -Palbociclib placed on hold beginning 11/07/2014 due to neutropenia. -Palbociclib resumed 11/27/2014 on an every other day schedule -Palbociclib resumed 01/04/2015 at a reduced dose of 100 mg daily for 21 days. -Palbociclib beginning 02/12/2015 at a dose of 100 mg daily for 14 days on/14 days off. -PET scan 05/18/2015 with diffuse hypermetabolic bone lesions, increased metabolic activity compared to the PET scan from January 2016 -Palbociclib resumed 06/07/2015, Faslodex continued 06/12/2015 -PET scan 09/22/2015 with diffuse hypermetabolic bone lesions, increased metabolic activity compared to the PET scan 05/18/2015 -Continuation of palbociclib and Faslodex -palbociclib and Faslodex discontinued April 2017 secondary to a C7 spinous process fracture and hypercalcemia -PET scan 58/59/[id number] hypermetabolic bone metastases, with increased metabolic activity interpreted as consistent with disease progression - Xeloda (7 days on/7 days off) started 02/28/2016 -PET scan 08/31/2016-marked improvement in hypermetabolic bone activity, no extra osseous disease -Xeloda continued -PET scan 06/05/2017-progression of bone metastase including a T3 lesion with paraspinal soft tissue, destructive lesion at T8 and progressive lesions at the shoulders, ribs, sternum, and pelvis -Xeloda  discontinued 06/08/2017 -Initiation  of weekly Taxol 06/26/2017, carboplatin added with week 3 07/10/2017, last Taxol/carboplatin given 10/02/2017 -PET scan 10/10/2017, development of rounded liver lesions in the setting of fatty liver with SUV above background suspicious for metastases, decreased as you be associated with multiple bone lesions -MRI liver 10/31/2017-diffuse liver metastases -Cycle 1 CMF 11/03/2017 3. BRCA1 variant felt to be a polymorphism  4. Pain/tenderness at the left low anterolateral chest wall-she completed palliative radiation on 05/28/2012 with improvement in the pain. She was also treated with radiation to the thoracic spine.  5. Anemia/leukopenia-likely secondary to breast cancer involving the bone marrow and radiation /afinitor -the anemia is improved.  Status post 1 unit of packed red cells 11/03/2017. 6. History of mildly elevated liver enzymes-? Related to afinitor,? Secondary to metastatic breast cancer; improved 11/09/2017. 7.  History of neutropenia secondary to Palbociclib. Improved. 8. Pain at the left shoulder and mid back-a plain x-ray of the left shoulder 06/12/2015 revealed a sclerotic density at the left scapula adjacent to the glenoid, there is metastatic disease involving the humeral head and left scapula on 05/18/2015. She completed a course of radiation to the left shoulder on 08/05/2015. 9. Low neck/upper back pain 12/24/2015 with MRI showing a mildly displaced acute-appearing C7 spinous process fracture; widespread osseous metastatic disease; no evidence of epidural tumor or spinal cord compression. - Completed palliative radiation to the cervical spine 02/17/2016 10. Dysphagia/odynophagia secondary to radiation-resolved 11. History of Xeloda-induced skin rash 12. Hypercalcemia secondary to #1. Status post Zometa 06/08/2017, 06/19/2017, and 07/03/2017.Pamidronate 06/26/2017.Calcitonin 07/03/2017, 07/04/2017, 07/06/2017; improved 07/07/2017; IV fluids and  Xgeva given 09/26/2017; Delton See 10/25/2017. 13.Nondisplaced left humerus fracture-likely a pathologic fracture, followed by orthopedics   Disposition: Ms. Trussell appears stable.  She completed cycle 1 CMF on 11/03/2017 and overall seems to have tolerated well.  We reviewed today's labs.  Calcium and LFTs some better.  She has mild thrombocytopenia.  New prescriptions were provided for hydromorphone and Zofran.  She will return for a follow-up visit and cycle 2 CMF on 11/23/2017.  She will be due for Xgeva that day as well.  She will contact the office in the interim with any problems.  Patient seen with Dr. Benay Spice.    Ned Card ANP/GNP-BC   11/09/2017  10:36 AM  This was a shared visit with Ned Card.  Ms. Raether appears to have tolerated the CMF well.  She will return for an office visit and cycle 2 CMF on 11/23/2017.  Julieanne Manson, MD

## 2017-11-09 NOTE — Telephone Encounter (Signed)
  Printed avs and calender of upcoming appointment. Per 2/21 los 

## 2017-11-19 ENCOUNTER — Encounter: Payer: Self-pay | Admitting: Oncology

## 2017-11-19 ENCOUNTER — Other Ambulatory Visit: Payer: Self-pay | Admitting: Oncology

## 2017-11-20 ENCOUNTER — Inpatient Hospital Stay: Payer: Medicare Other

## 2017-11-20 ENCOUNTER — Other Ambulatory Visit: Payer: Self-pay | Admitting: Nurse Practitioner

## 2017-11-20 ENCOUNTER — Telehealth: Payer: Self-pay | Admitting: Oncology

## 2017-11-20 ENCOUNTER — Inpatient Hospital Stay: Payer: Medicare Other | Attending: Nurse Practitioner | Admitting: Oncology

## 2017-11-20 ENCOUNTER — Ambulatory Visit (HOSPITAL_COMMUNITY)
Admission: RE | Admit: 2017-11-20 | Discharge: 2017-11-20 | Disposition: A | Discharge status: Home / Self Care | Payer: Medicare Other | Source: Ambulatory Visit | Attending: Oncology | Admitting: Oncology

## 2017-11-20 ENCOUNTER — Other Ambulatory Visit: Payer: Self-pay | Admitting: Oncology

## 2017-11-20 VITALS — BP 95/65 | HR 126 | Temp 98.0°F | Resp 17 | Ht 65.0 in | Wt 143.3 lb

## 2017-11-20 DIAGNOSIS — C7951 Secondary malignant neoplasm of bone: Secondary | ICD-10-CM | POA: Insufficient documentation

## 2017-11-20 DIAGNOSIS — Z79818 Long term (current) use of other agents affecting estrogen receptors and estrogen levels: Secondary | ICD-10-CM | POA: Diagnosis not present

## 2017-11-20 DIAGNOSIS — C50919 Malignant neoplasm of unspecified site of unspecified female breast: Secondary | ICD-10-CM

## 2017-11-20 DIAGNOSIS — K5900 Constipation, unspecified: Secondary | ICD-10-CM | POA: Insufficient documentation

## 2017-11-20 DIAGNOSIS — Z170 Estrogen receptor positive status [ER+]: Secondary | ICD-10-CM | POA: Diagnosis not present

## 2017-11-20 DIAGNOSIS — C50911 Malignant neoplasm of unspecified site of right female breast: Secondary | ICD-10-CM | POA: Insufficient documentation

## 2017-11-20 DIAGNOSIS — Z853 Personal history of malignant neoplasm of breast: Secondary | ICD-10-CM | POA: Diagnosis not present

## 2017-11-20 DIAGNOSIS — Z923 Personal history of irradiation: Secondary | ICD-10-CM | POA: Insufficient documentation

## 2017-11-20 DIAGNOSIS — E46 Unspecified protein-calorie malnutrition: Secondary | ICD-10-CM | POA: Insufficient documentation

## 2017-11-20 DIAGNOSIS — R112 Nausea with vomiting, unspecified: Secondary | ICD-10-CM | POA: Diagnosis not present

## 2017-11-20 DIAGNOSIS — Z9889 Other specified postprocedural states: Secondary | ICD-10-CM

## 2017-11-20 DIAGNOSIS — Z9181 History of falling: Secondary | ICD-10-CM | POA: Insufficient documentation

## 2017-11-20 DIAGNOSIS — Z9011 Acquired absence of right breast and nipple: Secondary | ICD-10-CM | POA: Insufficient documentation

## 2017-11-20 DIAGNOSIS — C787 Secondary malignant neoplasm of liver and intrahepatic bile duct: Secondary | ICD-10-CM | POA: Insufficient documentation

## 2017-11-20 DIAGNOSIS — R531 Weakness: Secondary | ICD-10-CM | POA: Insufficient documentation

## 2017-11-20 DIAGNOSIS — Z9221 Personal history of antineoplastic chemotherapy: Secondary | ICD-10-CM | POA: Insufficient documentation

## 2017-11-20 DIAGNOSIS — Z9223 Personal history of estrogen therapy: Secondary | ICD-10-CM | POA: Insufficient documentation

## 2017-11-20 DIAGNOSIS — G9389 Other specified disorders of brain: Secondary | ICD-10-CM | POA: Diagnosis not present

## 2017-11-20 LAB — CMP (CANCER CENTER ONLY)
ALBUMIN: 3.3 g/dL — AB (ref 3.5–5.0)
ALK PHOS: 186 U/L — AB (ref 40–150)
ALT: 68 U/L — AB (ref 0–55)
AST: 155 U/L — AB (ref 5–34)
Anion gap: 11 (ref 3–11)
BUN: 10 mg/dL (ref 7–26)
CALCIUM: 11 mg/dL — AB (ref 8.4–10.4)
CO2: 24 mmol/L (ref 22–29)
CREATININE: 1.12 mg/dL — AB (ref 0.60–1.10)
Chloride: 105 mmol/L (ref 98–109)
GFR, EST NON AFRICAN AMERICAN: 50 mL/min — AB (ref >60)
GFR, Est AFR Am: 58 mL/min — ABNORMAL LOW (ref >60)
GLUCOSE: 182 mg/dL — AB (ref 70–140)
Potassium: 3.2 mmol/L — ABNORMAL LOW (ref 3.5–5.1)
Sodium: 140 mmol/L (ref 136–145)
Total Bilirubin: 0.8 mg/dL (ref 0.2–1.2)
Total Protein: 7.1 g/dL (ref 6.4–8.3)

## 2017-11-20 LAB — CBC WITH DIFFERENTIAL (CANCER CENTER ONLY)
BASOS ABS: 0.1 10*3/uL (ref 0.0–0.1)
Basophils Relative: 3 %
EOS PCT: 0 %
Eosinophils Absolute: 0 10*3/uL (ref 0.0–0.5)
HEMATOCRIT: 29.8 % — AB (ref 34.8–46.6)
HEMOGLOBIN: 9.8 g/dL — AB (ref 11.6–15.9)
LYMPHS ABS: 0.6 10*3/uL — AB (ref 0.9–3.3)
LYMPHS PCT: 19 %
MCH: 30.4 pg (ref 25.1–34.0)
MCHC: 33 g/dL (ref 31.5–36.0)
MCV: 92.3 fL (ref 79.5–101.0)
MONO ABS: 0.4 10*3/uL (ref 0.1–0.9)
Monocytes Relative: 14 %
NEUTROS ABS: 1.9 10*3/uL (ref 1.5–6.5)
Neutrophils Relative %: 64 %
Platelet Count: 151 10*3/uL (ref 145–400)
RBC: 3.23 MIL/uL — AB (ref 3.70–5.45)
RDW: 20.9 % — AB (ref 11.2–14.5)
WBC: 2.9 10*3/uL — AB (ref 3.9–10.3)
nRBC: 3 /100 WBC — ABNORMAL HIGH

## 2017-11-20 MED ORDER — IOPAMIDOL (ISOVUE-300) INJECTION 61%
75.0000 mL | Freq: Once | INTRAVENOUS | Status: AC | PRN
  Administered 2017-11-20: 75 mL via INTRAVENOUS

## 2017-11-20 MED ORDER — SORBITOL 70 % PO SOLN: 30.0000 mL | Freq: Two times a day (BID) | ORAL | 0 refills | Status: DC | PRN

## 2017-11-20 MED ORDER — LORAZEPAM 0.5 MG PO TABS: 0.5000 mg | ORAL_TABLET | Freq: Three times a day (TID) | ORAL | 0 refills | Status: AC | PRN

## 2017-11-20 MED ORDER — SODIUM CHLORIDE 0.9 % IV SOLN: 10.0000 mg | Freq: Once | INTRAVENOUS | Status: DC

## 2017-11-20 MED ORDER — DEXAMETHASONE SODIUM PHOSPHATE 10 MG/ML IJ SOLN
10.0000 mg | Freq: Once | INTRAMUSCULAR | Status: AC
  Administered 2017-11-20: 10 mg via INTRAVENOUS

## 2017-11-20 MED ORDER — LORAZEPAM 0.5 MG PO TABS: 0.5000 mg | ORAL_TABLET | Freq: Three times a day (TID) | ORAL | 0 refills | Status: DC

## 2017-11-20 MED ORDER — IOPAMIDOL (ISOVUE-300) INJECTION 61%
INTRAVENOUS | Status: AC
  Filled 2017-11-20: qty 75

## 2017-11-20 MED ORDER — SODIUM CHLORIDE 0.9 % IJ SOLN
INTRAMUSCULAR | Status: AC
  Filled 2017-11-20: qty 50

## 2017-11-20 MED ORDER — SODIUM CHLORIDE 0.9 % IV SOLN
Freq: Once | INTRAVENOUS | Status: AC
  Administered 2017-11-20: 14:00:00 via INTRAVENOUS

## 2017-11-20 MED ORDER — DEXAMETHASONE SODIUM PHOSPHATE 10 MG/ML IJ SOLN
INTRAMUSCULAR | Status: AC
  Filled 2017-11-20: qty 1

## 2017-11-20 NOTE — Progress Notes (Signed)
[facility] OFFICE PROGRESS NOTE   Diagnosis: Breast cancer  INTERVAL HISTORY:   Stacy Horn returns prior to his scheduled visit.  She reports nausea and vomiting beginning on 11/16/2017.  The nausea is worse when she gets out of bed.  She is tolerating liquids.  She last had a bowel movement on 11/18/2017. No balance difficulty.  No focal neurologic symptoms.  Her pain is improved.  Objective:  Vital signs in last 24 hours:  Blood pressure 95/65, pulse (!) 126, temperature 98 F (36.7 C), temperature source Oral, resp. rate 17, height '5\' 5"'  (1.651 m), weight 143 lb 4.8 oz (65 kg), last menstrual period 10/08/2014, SpO2 100 %.    HEENT: No thrush or ulcers Resp: Lungs clear bilaterally Cardio: Regular rate and rhythm GI: No hepatosplenomegaly, nontender, no mass Vascular: No leg edema Neuro: Alert and oriented, the face is symmetric, finger to nose testing is normal. Skin: Diminished skin turgor    Lab Results:  Lab Results  Component Value Date   WBC 2.9 (L) 11/20/2017   HGB 8.2 (L) 10/11/2017   HCT 29.8 (L) 11/20/2017   MCV 92.3 11/20/2017   PLT 151 11/20/2017   NEUTROABS 1.9 11/20/2017    CMP     Component Value Date/Time   NA 138 11/09/2017 0915   NA 141 09/11/2017 1106   K 4.0 11/09/2017 0915   K 3.1 (L) 09/11/2017 1106   CL 109 11/09/2017 0915   CL 107 12/20/2012 0927   CO2 20 (L) 11/09/2017 0915   CO2 19 (L) 09/11/2017 1106   GLUCOSE 110 11/09/2017 0915   GLUCOSE 136 09/11/2017 1106   GLUCOSE 89 12/20/2012 0927   BUN 9 11/09/2017 0915   BUN 8.7 09/11/2017 1106   CREATININE 0.77 11/09/2017 0915   CREATININE 0.8 09/11/2017 1106   CALCIUM 12.8 (H) 11/09/2017 0915   CALCIUM 12.4 (H) 09/11/2017 1106   PROT 7.0 11/09/2017 0915   PROT 7.0 09/11/2017 1106   ALBUMIN 3.3 (L) 11/09/2017 0915   ALBUMIN 3.7 09/11/2017 1106   AST 81 (H) 11/09/2017 0915   AST 53 (H) 09/11/2017 1106   ALT 50 11/09/2017 0915   ALT 38 09/11/2017 1106   ALKPHOS  173 (H) 11/09/2017 0915   ALKPHOS 124 09/11/2017 1106   BILITOT 0.6 11/09/2017 0915   BILITOT 0.39 09/11/2017 1106   GFRNONAA >60 11/09/2017 0915   GFRAA >60 11/09/2017 0915    Lab Results  Component Value Date   CEA1 <1.00 09/04/2017    Lab Results  Component Value Date   INR 0.93 07/06/2017    Imaging:  No results found.  Medications: I have reviewed the patient's current medications.   Assessment/Plan: 1. Multicentric invasive lobular carcinoma of the right breast diagnosed in December of 1999, a right mastectomy followed by adjuvant [facility] chemotherapy, 5 years of tamoxifen, and 5 years of Femara. The Femara was completed in June of 2010. The right breast mass excision in January 2000 was ER positive, PR positive, and HER-2 negative  2. Pain at the left groin area with a CT of the pelvis on 02/07/12 confirming a destructive lytic lesion at the left pubic symphysis, status post a CT-guided biopsy on 02/16/12 confirming metastatic carcinoma, focally ER positive .  -PET scan 03/07/2012 confirmed multiple hypermetabolic bone metastases with no other evidence of metastatic disease  -Status post palliative radiation to the pelvis beginning on 03/12/2012  -Initiation of Arimidex on 03/27/2012 -initiation of every three-month xgeva on 04/04/2012  -PET  scan September 14 [id number] new hypermetabolic bone lesions  -Initiation of Faslodex 09/27/2012  -Restaging PET scan 04/08/2013 with evidence of disease progression in the bones  -Initiation of Aromasin/afinitor 05/29/2013  -Iliac biopsy at [facility] 05/22/2013 confirming HER-2 negative metastatic breast  -PET scan 11/18/2013 with a decrease in hypermetabolic activity  -PET scan 10/08/2014 with progressive diffuse hypermetabolic bone metastases -Initiation of Palbociclib 10/20/2014; Faslodex 10/23/2014. -Palbociclib placed on hold beginning 11/07/2014 due to neutropenia. -Palbociclib resumed 11/27/2014 on an every other day  schedule -Palbociclib resumed 01/04/2015 at a reduced dose of 100 mg daily for 21 days. -Palbociclib beginning 02/12/2015 at a dose of 100 mg daily for 14 days on/14 days off. -PET scan 05/18/2015 with diffuse hypermetabolic bone lesions, increased metabolic activity compared to the PET scan from January 2016 -Palbociclib resumed 06/07/2015, Faslodex continued 06/12/2015 -PET scan 09/22/2015 with diffuse hypermetabolic bone lesions, increased metabolic activity compared to the PET scan 05/18/2015 -Continuation of palbociclib and Faslodex -palbociclib and Faslodex discontinued April 2017 secondary to a C7 spinous process fracture and hypercalcemia -PET scan 30/03/6225-[id number] hypermetabolic bone metastases, with increased metabolic activity interpreted as consistent with disease progression - Xeloda (7 days on/7 days off) started 02/28/2016 -PET scan 08/31/2016-marked improvement in hypermetabolic bone activity, no extra osseous disease -Xeloda continued -PET scan 06/05/2017-progression of bone metastase including a T3 lesion with paraspinal soft tissue, destructive lesion at T8 and progressive lesions at the shoulders, ribs, sternum, and pelvis -Xeloda discontinued 06/08/2017 -Initiation of weekly Taxol 06/26/2017, carboplatin added with week 3 07/10/2017, last Taxol/carboplatin given 10/02/2017 -PET scan 10/10/2017, development of rounded liver lesions in the setting of fatty liver with SUV above background suspicious for metastases, decreased as you be associated with multiple bone lesions -MRI liver2/08/2018-diffuse liver metastases -Cycle 1 CMF 11/03/2017 3. BRCA1 variant felt to be a polymorphism  4. Pain/tenderness at the left low anterolateral chest wall-she completed palliative radiation on 05/28/2012 with improvement in the pain. She was also treated with radiation to the thoracic spine.  5. Anemia/leukopenia-likely secondary to breast cancer involving the bone marrow and radiation  /afinitor -the anemia is improved.  Status post 1 unit of packed red cells 11/03/2017. 6. History of mildly elevated liver enzymes-? Related to afinitor,? Secondary to metastatic breast cancer; improved 11/09/2017. 7.  History of neutropenia secondary to Palbociclib. Improved. 8. Pain at the left shoulder and mid back-a plain x-ray of the left shoulder 06/12/2015 revealed a sclerotic density at the left scapula adjacent to the glenoid, there is metastatic disease involving the humeral head and left scapula on 05/18/2015. She completed a course of radiation to the left shoulder on 08/05/2015. 9. Low neck/upper back pain 12/24/2015 with MRI showing a mildly displaced acute-appearing C7 spinous process fracture; widespread osseous metastatic disease; no evidence of epidural tumor or spinal cord compression. - Completed palliative radiation to the cervical spine 02/17/2016 10. Dysphagia/odynophagia secondary to radiation-resolved 11. History of Xeloda-induced skin rash 12. Hypercalcemia secondary to #1. Status post Zometa 06/08/2017, 06/19/2017, and 07/03/2017.Pamidronate 06/26/2017.Calcitonin 07/03/2017, 07/04/2017, 07/06/2017; improved 07/07/2017; IV fluids and Xgeva given 09/26/2017; Delton See 10/25/2017. 13.Nondisplaced left humerus fracture-likely a pathologic fracture, followed by orthopedics   Disposition: Stacy Horn has developed nausea and vomiting.  The etiology is unclear.  She has no symptoms to suggest infectious enteritis.  Weighted to liver metastases, polypharmacy, or development of CNS metastases.  She does not have focal symptoms to suggest brain metastases.  The nausea could be related to MS Contin.  It would be very unlikely to have delayed nausea from  the CMF.  We will check a chemistry panel and administer IV fluids today.  She will receive a dose of Decadron.  I will order a brain CT if the creatinine is normal.  She is scheduled for a follow-up visit on 11/23/2017.  25 minutes were  spent with the patient today.  The majority of the time was used for counseling and coordination of care.  [name], MD  11/20/2017  2:11 PM

## 2017-11-20 NOTE — Telephone Encounter (Signed)
No 3/4 los °

## 2017-11-20 NOTE — Addendum Note (Signed)
Addended by: Brien Few on: 11/20/2017 05:09 PM   Modules accepted: Orders

## 2017-11-20 NOTE — Telephone Encounter (Signed)
Called pt, she reports persistent nausea, gagging and vomiting whenever she stands up. Pt denies any headache or vision changes. Reports she is staying in bed all day due to weakness.

## 2017-11-21 ENCOUNTER — Telehealth: Payer: Self-pay | Admitting: *Deleted

## 2017-11-21 ENCOUNTER — Encounter: Payer: Self-pay | Admitting: Nurse Practitioner

## 2017-11-21 DIAGNOSIS — C7951 Secondary malignant neoplasm of bone: Secondary | ICD-10-CM | POA: Diagnosis not present

## 2017-11-21 DIAGNOSIS — C787 Secondary malignant neoplasm of liver and intrahepatic bile duct: Secondary | ICD-10-CM | POA: Diagnosis not present

## 2017-11-21 DIAGNOSIS — Z79818 Long term (current) use of other agents affecting estrogen receptors and estrogen levels: Secondary | ICD-10-CM | POA: Diagnosis not present

## 2017-11-21 DIAGNOSIS — C50911 Malignant neoplasm of unspecified site of right female breast: Secondary | ICD-10-CM | POA: Diagnosis not present

## 2017-11-21 DIAGNOSIS — Z853 Personal history of malignant neoplasm of breast: Secondary | ICD-10-CM | POA: Diagnosis not present

## 2017-11-21 DIAGNOSIS — Z170 Estrogen receptor positive status [ER+]: Secondary | ICD-10-CM | POA: Diagnosis not present

## 2017-11-21 MED ORDER — SODIUM CHLORIDE 0.9% FLUSH
10.0000 mL | Freq: Once | INTRAVENOUS | Status: AC
  Administered 2017-11-21: 10 mL

## 2017-11-21 MED ORDER — HEPARIN SOD (PORK) LOCK FLUSH 100 UNIT/ML IV SOLN
500.0000 [IU] | Freq: Once | INTRAVENOUS | Status: AC
  Administered 2017-11-21: 500 [IU]

## 2017-11-21 NOTE — Telephone Encounter (Signed)
Late entry for 0930: Pt came in to have port de-accessed. She has not had any nausea or vomiting since leaving last night. She has not taken MS Contin, has a bit more pain today, managing with PRN meds. Husband reports the sorbitol is affordable at $10. They will pick it up today.

## 2017-11-21 NOTE — Addendum Note (Signed)
Addended by: Brien Few on: 11/21/2017 09:37 AM   Modules accepted: Orders

## 2017-11-22 ENCOUNTER — Other Ambulatory Visit: Payer: Self-pay | Admitting: Emergency Medicine

## 2017-11-22 DIAGNOSIS — C50919 Malignant neoplasm of unspecified site of unspecified female breast: Secondary | ICD-10-CM

## 2017-11-22 DIAGNOSIS — C7951 Secondary malignant neoplasm of bone: Principal | ICD-10-CM

## 2017-11-22 MED ORDER — SORBITOL 70 % PO SOLN: 30.0000 mL | Freq: Two times a day (BID) | ORAL | 0 refills | Status: AC | PRN

## 2017-11-22 MED FILL — SORBITOL 70% SOLUTION: 70 | 7 days supply | Qty: 474 | Fill #0

## 2017-11-23 ENCOUNTER — Inpatient Hospital Stay: Payer: Medicare Other

## 2017-11-23 ENCOUNTER — Telehealth: Payer: Self-pay | Admitting: *Deleted

## 2017-11-23 ENCOUNTER — Encounter: Payer: Self-pay | Admitting: *Deleted

## 2017-11-23 ENCOUNTER — Ambulatory Visit: Payer: Medicare Other

## 2017-11-23 ENCOUNTER — Inpatient Hospital Stay (HOSPITAL_BASED_OUTPATIENT_CLINIC_OR_DEPARTMENT_OTHER): Payer: Medicare Other | Admitting: Oncology

## 2017-11-23 ENCOUNTER — Telehealth: Payer: Self-pay | Admitting: Oncology

## 2017-11-23 VITALS — BP 105/71 | HR 120 | Temp 99.0°F | Resp 17 | Ht 65.0 in

## 2017-11-23 DIAGNOSIS — C787 Secondary malignant neoplasm of liver and intrahepatic bile duct: Secondary | ICD-10-CM | POA: Diagnosis not present

## 2017-11-23 DIAGNOSIS — C7951 Secondary malignant neoplasm of bone: Secondary | ICD-10-CM | POA: Diagnosis not present

## 2017-11-23 DIAGNOSIS — Z9221 Personal history of antineoplastic chemotherapy: Secondary | ICD-10-CM

## 2017-11-23 DIAGNOSIS — C50919 Malignant neoplasm of unspecified site of unspecified female breast: Secondary | ICD-10-CM

## 2017-11-23 DIAGNOSIS — Z9889 Other specified postprocedural states: Secondary | ICD-10-CM

## 2017-11-23 DIAGNOSIS — K5900 Constipation, unspecified: Secondary | ICD-10-CM

## 2017-11-23 DIAGNOSIS — Z9223 Personal history of estrogen therapy: Secondary | ICD-10-CM | POA: Diagnosis not present

## 2017-11-23 DIAGNOSIS — Z9181 History of falling: Secondary | ICD-10-CM

## 2017-11-23 DIAGNOSIS — Z79818 Long term (current) use of other agents affecting estrogen receptors and estrogen levels: Secondary | ICD-10-CM

## 2017-11-23 DIAGNOSIS — Z170 Estrogen receptor positive status [ER+]: Secondary | ICD-10-CM | POA: Diagnosis not present

## 2017-11-23 DIAGNOSIS — Z853 Personal history of malignant neoplasm of breast: Secondary | ICD-10-CM

## 2017-11-23 DIAGNOSIS — Z9011 Acquired absence of right breast and nipple: Secondary | ICD-10-CM

## 2017-11-23 DIAGNOSIS — Z8639 Personal history of other endocrine, nutritional and metabolic disease: Secondary | ICD-10-CM | POA: Diagnosis not present

## 2017-11-23 DIAGNOSIS — Z923 Personal history of irradiation: Secondary | ICD-10-CM | POA: Diagnosis not present

## 2017-11-23 DIAGNOSIS — R531 Weakness: Secondary | ICD-10-CM

## 2017-11-23 DIAGNOSIS — C50911 Malignant neoplasm of unspecified site of right female breast: Secondary | ICD-10-CM | POA: Diagnosis not present

## 2017-11-23 DIAGNOSIS — E46 Unspecified protein-calorie malnutrition: Secondary | ICD-10-CM

## 2017-11-23 LAB — CMP (CANCER CENTER ONLY)
ALBUMIN: 2.9 g/dL — AB (ref 3.5–5.0)
ALK PHOS: 253 U/L — AB (ref 40–150)
ALT: 91 U/L — AB (ref 0–55)
ANION GAP: 8 (ref 3–11)
AST: 248 U/L (ref 5–34)
BILIRUBIN TOTAL: 1 mg/dL (ref 0.2–1.2)
BUN: 12 mg/dL (ref 7–26)
CO2: 25 mmol/L (ref 22–29)
CREATININE: 0.8 mg/dL (ref 0.60–1.10)
Calcium: 13.6 mg/dL (ref 8.4–10.4)
Chloride: 102 mmol/L (ref 98–109)
GFR, Estimated: >60 mL/min (ref >60)
GLUCOSE: 139 mg/dL (ref 70–140)
Potassium: 3.2 mmol/L — ABNORMAL LOW (ref 3.5–5.1)
Sodium: 135 mmol/L — ABNORMAL LOW (ref 136–145)
TOTAL PROTEIN: 6.8 g/dL (ref 6.4–8.3)

## 2017-11-23 LAB — CBC WITH DIFFERENTIAL (CANCER CENTER ONLY)
BASOS PCT: 1 %
Basophils Absolute: 0 10*3/uL (ref 0.0–0.1)
EOS ABS: 0 10*3/uL (ref 0.0–0.5)
Eosinophils Relative: 0 %
HCT: 28 % — ABNORMAL LOW (ref 34.8–46.6)
HEMOGLOBIN: 9 g/dL — AB (ref 11.6–15.9)
LYMPHS ABS: 0.6 10*3/uL — AB (ref 0.9–3.3)
Lymphocytes Relative: 20 %
MCH: 29.9 pg (ref 25.1–34.0)
MCHC: 32.1 g/dL (ref 31.5–36.0)
MCV: 93 fL (ref 79.5–101.0)
MONOS PCT: 17 %
Monocytes Absolute: 0.5 10*3/uL (ref 0.1–0.9)
NEUTROS PCT: 62 %
Neutro Abs: 1.7 10*3/uL (ref 1.5–6.5)
PLATELETS: 136 10*3/uL — AB (ref 145–400)
RBC: 3.01 MIL/uL — ABNORMAL LOW (ref 3.70–5.45)
RDW: 19.2 % — AB (ref 11.2–14.5)
Smear Review: 1
WBC Count: 2.8 10*3/uL — ABNORMAL LOW (ref 3.9–10.3)

## 2017-11-23 LAB — AMMONIA: Ammonia: 35 umol/L (ref 9–35)

## 2017-11-23 MED ORDER — DENOSUMAB 120 MG/1.7ML ~~LOC~~ SOLN
SUBCUTANEOUS | Status: AC
  Filled 2017-11-23: qty 1.7

## 2017-11-23 MED ORDER — SODIUM CHLORIDE 0.9% FLUSH
10.0000 mL | Freq: Once | INTRAVENOUS | Status: AC
  Administered 2017-11-23: 10 mL
  Filled 2017-11-23: qty 10

## 2017-11-23 MED ORDER — SODIUM CHLORIDE 0.9 % IV SOLN
INTRAVENOUS | Status: DC
  Administered 2017-11-23: 13:00:00 via INTRAVENOUS

## 2017-11-23 MED ORDER — DENOSUMAB 120 MG/1.7ML ~~LOC~~ SOLN
120.0000 mg | Freq: Once | SUBCUTANEOUS | Status: AC
  Administered 2017-11-23: 120 mg via SUBCUTANEOUS

## 2017-11-23 NOTE — Progress Notes (Signed)
[place] OFFICE PROGRESS NOTE   Diagnosis: Breast cancer  INTERVAL HISTORY:   Ms. Stalnaker returns for a scheduled visit.  We saw her earlier this week with intractable nausea/vomiting.  A brain CT was negative.  She discontinued MS Contin.  The nausea resolved after stopping the MS Contin. She noted increased pain after stopping MS Contin and is taking hydromorphone every 4 hours.  Her husband reports she has been very weak for the past few days.  She is unable to ambulate independently and has experienced a fall.  She has difficulty taking pills secondary to arm weakness.  He has noted mild confusion.  She is constipated.  No difficulty with bladder control.  She is drinking fluids, but eating very little.  Objective:  Vital signs in last 24 hours:  Blood pressure 105/71, pulse (!) 120, temperature 99 F (37.2 C), temperature source Oral, resp. rate 17, height '5\' 5"'  (1.651 m), last menstrual period 10/08/2014, SpO2 100 %.    HEENT: No thrush Resp: Lungs clear bilaterally Cardio: Regular rate and rhythm GI: No hepatomegaly, nontender Vascular: No leg edema Neuro: Lethargic, arousable, the pupils are equal and reactive.  She follows commands slowly.  She moves all extremities.  She ambulates a short distance with assistance and an unsteady gait.  Generalized 4/5 strength in the extremities.  Oriented.  She has myoclonic jerks.    Portacath/PICC-without erythema  Lab Results:  Lab Results  Component Value Date   WBC 2.8 (L) 11/23/2017   HGB 8.2 (L) 10/11/2017   HCT 28.0 (L) 11/23/2017   MCV 93.0 11/23/2017   PLT 136 (L) 11/23/2017   NEUTROABS 1.7 11/23/2017    CMP     Component Value Date/Time   NA 135 (L) 11/23/2017 1037   NA 141 09/11/2017 1106   K 3.2 (L) 11/23/2017 1037   K 3.1 (L) 09/11/2017 1106   CL 102 11/23/2017 1037   CL 107 12/20/2012 0927   CO2 25 11/23/2017 1037   CO2 19 (L) 09/11/2017 1106   GLUCOSE 139 11/23/2017 1037   GLUCOSE 136  09/11/2017 1106   GLUCOSE 89 12/20/2012 0927   BUN 12 11/23/2017 1037   BUN 8.7 09/11/2017 1106   CREATININE 0.80 11/23/2017 1037   CREATININE 0.8 09/11/2017 1106   CALCIUM 13.6 (HH) 11/23/2017 1037   CALCIUM 12.4 (H) 09/11/2017 1106   PROT 6.8 11/23/2017 1037   PROT 7.0 09/11/2017 1106   ALBUMIN 2.9 (L) 11/23/2017 1037   ALBUMIN 3.7 09/11/2017 1106   AST 248 (HH) 11/23/2017 1037   AST 53 (H) 09/11/2017 1106   ALT 91 (H) 11/23/2017 1037   ALT 38 09/11/2017 1106   ALKPHOS 253 (H) 11/23/2017 1037   ALKPHOS 124 09/11/2017 1106   BILITOT 1.0 11/23/2017 1037   BILITOT 0.39 09/11/2017 1106   GFRNONAA >60 11/23/2017 1037   GFRAA >60 11/23/2017 1037      Imaging:  Ct Head W Wo Contrast  Result Date: 11/20/2017 CLINICAL DATA:  Breast cancer. New onset nausea and vomiting. Evaluate for metastatic disease. EXAM: CT HEAD WITHOUT AND WITH CONTRAST TECHNIQUE: Contiguous axial images were obtained from the base of the skull through the vertex without and with intravenous contrast CONTRAST:  48m ISOVUE-300 IOPAMIDOL (ISOVUE-300) INJECTION 61% COMPARISON:  PET-CT 10/10/2017. FINDINGS: Brain: No evidence of acute infarction, hemorrhage, hydrocephalus, extra-axial collection or mass lesion/mass effect. No abnormal intracranial enhancement. Negative for brain sag. Vascular: No hyperdense vessel or unexpected calcification. Visible vessels are patent. Skull: Heterogeneous  density of the skull base with patchy lucency in the calvarium. Patient has known widespread bony metastatic disease. No visible dural or parenchymal invasion. Sinuses/Orbits: Negative. IMPRESSION: 1. Negative intracranial imaging. No acute finding or evidence of metastatic disease. 2. Known osseous metastatic disease. Electronically Signed   By: Monte Fantasia M.D.   On: 11/20/2017 16:43    Medications: I have reviewed the patient's current medications.   Assessment/Plan: 1. Multicentric invasive lobular carcinoma of the right  breast diagnosed in December of 1999, a right mastectomy followed by adjuvant [facility] chemotherapy, 5 years of tamoxifen, and 5 years of Femara. The Femara was completed in June of 2010. The right breast mass excision in January 2000 was ER positive, PR positive, and HER-2 negative  2. Pain at the left groin area with a CT of the pelvis on 02/07/12 confirming a destructive lytic lesion at the left pubic symphysis, status post a CT-guided biopsy on 02/16/12 confirming metastatic carcinoma, focally ER positive .  -PET scan 03/07/2012 confirmed multiple hypermetabolic bone metastases with no other evidence of metastatic disease  -Status post palliative radiation to the pelvis beginning on 03/12/2012  -Initiation of Arimidex on 03/27/2012 -initiation of every three-month xgeva on 04/04/2012  -PET scan September 14 [id number] new hypermetabolic bone lesions  -Initiation of Faslodex 09/27/2012  -Restaging PET scan 04/08/2013 with evidence of disease progression in the bones  -Initiation of Aromasin/afinitor 05/29/2013  -Iliac biopsy at [facility] 05/22/2013 confirming HER-2 negative metastatic breast  -PET scan 11/18/2013 with a decrease in hypermetabolic activity  -PET scan 10/08/2014 with progressive diffuse hypermetabolic bone metastases -Initiation of Palbociclib 10/20/2014; Faslodex 10/23/2014. -Palbociclib placed on hold beginning 11/07/2014 due to neutropenia. -Palbociclib resumed 11/27/2014 on an every other day schedule -Palbociclib resumed 01/04/2015 at a reduced dose of 100 mg daily for 21 days. -Palbociclib beginning 02/12/2015 at a dose of 100 mg daily for 14 days on/14 days off. -PET scan 05/18/2015 with diffuse hypermetabolic bone lesions, increased metabolic activity compared to the PET scan from January 2016 -Palbociclib resumed 06/07/2015, Faslodex continued 06/12/2015 -PET scan 09/22/2015 with diffuse hypermetabolic bone lesions, increased metabolic activity compared to the PET scan  05/18/2015 -Continuation of palbociclib and Faslodex -palbociclib and Faslodex discontinued April 2017 secondary to a C7 spinous process fracture and hypercalcemia -PET scan [id number] hypermetabolic bone metastases, with increased metabolic activity interpreted as consistent with disease progression - Xeloda (7 days on/7 days off) started 02/28/2016 -PET scan 08/31/2016-marked improvement in hypermetabolic bone activity, no extra osseous disease -Xeloda continued -PET scan 06/05/2017-progression of bone metastase including a T3 lesion with paraspinal soft tissue, destructive lesion at T8 and progressive lesions at the shoulders, ribs, sternum, and pelvis -Xeloda discontinued 06/08/2017 -Initiation of weekly Taxol 06/26/2017, carboplatin added with week 3 07/10/2017, last Taxol/carboplatin given 10/02/2017 -PET scan 10/10/2017, development of rounded liver lesions in the setting of fatty liver with SUV above background suspicious for metastases, decreased as you be associated with multiple bone lesions -MRI liver2/08/2018-diffuse liver metastases -Cycle 1 CMF 11/03/2017 3. BRCA1 variant felt to be a polymorphism  4. Pain/tenderness at the left low anterolateral chest wall-she completed palliative radiation on 05/28/2012 with improvement in the pain. She was also treated with radiation to the thoracic spine.  5. Anemia/leukopenia-likely secondary to breast cancer involving the bone marrow and radiation /afinitor -the anemia is improved.Status post 1 unit of packed red cells 11/03/2017. 6. History of mildly elevated liver enzymes-? Related to afinitor,? Secondary to metastatic breast cancer;improved 11/09/2017. 7.History of neutropenia secondary to Palbociclib. Improved. 8.  Pain at the left shoulder and mid back-a plain x-ray of the left shoulder 06/12/2015 revealed a sclerotic density at the left scapula adjacent to the glenoid, there is metastatic disease involving the humeral head and  left scapula on 05/18/2015. She completed a course of radiation to the left shoulder on 08/05/2015. 9. Low neck/upper back pain 12/24/2015 with MRI showing a mildly displaced acute-appearing C7 spinous process fracture; widespread osseous metastatic disease; no evidence of epidural tumor or spinal cord compression. - Completed palliative radiation to the cervical spine 02/17/2016 10. Dysphagia/odynophagia secondary to radiation-resolved 11.History ofXeloda-induced skin rash 12. Hypercalcemia secondary to #1. Status post Zometa 06/08/2017, 06/19/2017, and 07/03/2017.Pamidronate 06/26/2017.Calcitonin 07/03/2017, 07/04/2017, 07/06/2017; improved 07/07/2017; IV fluids and Xgeva given 09/26/2017;Xgeva 10/25/2017. 13.Nondisplaced left humerus fracture-likely a pathologic fracture, followed by orthopedics 14.  Intractable nausea/vomiting 11/20/2017- improved after discontinuing MS Contin 15.  Global weakness, inability to ambulate 11/23/2017, CT brain negative on 11/20/2017  Disposition: Ms. Gaffin has metastatic breast cancer.  She has completed a cycle of salvage CMF chemotherapy.  Her calcium was improved earlier this week, but is higher today.  Her performance status is declining.  I suspect the decline in her neurologic status is related to global progression of the breast cancer/malnutrition/deconditioning as opposed to development of focal neurologic deficits.  I discussed the poor prognosis with Ms. Loewenstein and her husband.  She agrees to a Hospice referral.  Her husband is having difficulty caring for her in the home secondary to the inability to ambulate and perform self-care.  She may require nursing facility placement in the near future.  She would benefit from a home hospital bed, wheelchair, and raised commode.  We discussed CPR and ACLS issues.  She will be placed on a no CODE BLUE status.  The lethargy and weakness may be in part related to narcotics or hepatic encephalopathy.  We will  check an ammonia level today.  Her husband will limit narcotic use as tolerated.  She will be treated for hypercalcemia today.  Ms. Maybee will return for an office visit 12/04/2017.  25 minutes were spent with the patient today.  The majority of the time was used for counseling and coordination of care.  [name], MD  11/23/2017  12:08 PM

## 2017-11-23 NOTE — Patient Instructions (Signed)
[facility] Discharge Instructions for Patients Receiving Chemotherapy  Today you received the following chemotherapy agents Xgeva  To help prevent nausea and vomiting after your treatment, we encourage you to take your nausea medication as directed   If you develop nausea and vomiting that is not controlled by your nausea medication, call the clinic.   BELOW ARE SYMPTOMS THAT SHOULD BE REPORTED IMMEDIATELY:  *FEVER GREATER THAN 100.5 F  *CHILLS WITH OR WITHOUT FEVER  NAUSEA AND VOMITING THAT IS NOT CONTROLLED WITH YOUR NAUSEA MEDICATION  *UNUSUAL SHORTNESS OF BREATH  *UNUSUAL BRUISING OR BLEEDING  TENDERNESS IN MOUTH AND THROAT WITH OR WITHOUT PRESENCE OF ULCERS  *URINARY PROBLEMS  *BOWEL PROBLEMS  UNUSUAL RASH Items with * indicate a potential emergency and should be followed up as soon as possible.  Feel free to call the clinic should you have any questions or concerns. The clinic phone number is [phone].  Please show the [facility] at check-in to the Emergency Department and triage nurse.   Cyclophosphamide (Cytoxan) injection What is this medicine? CYCLOPHOSPHAMIDE (sye kloe FOSS fa mide) is a chemotherapy drug. It slows the growth of cancer cells. This medicine is used to treat many types of cancer like lymphoma, myeloma, leukemia, breast cancer, and ovarian cancer, to name a few. This medicine may be used for other purposes; ask your health care provider or pharmacist if you have questions. COMMON BRAND NAME(S): Cytoxan, Neosar What should I tell my health care provider before I take this medicine? They need to know if you have any of these conditions: -blood disorders -history of other chemotherapy -infection -kidney disease -liver disease -recent or ongoing radiation therapy -tumors in the bone marrow -an unusual or allergic reaction to cyclophosphamide, other chemotherapy, other medicines, foods, dyes, or preservatives -pregnant  or trying to get pregnant -breast-feeding How should I use this medicine? This drug is usually given as an injection into a vein or muscle or by infusion into a vein. It is administered in a hospital or clinic by a specially trained health care professional. Talk to your pediatrician regarding the use of this medicine in children. Special care may be needed. Overdosage: If you think you have taken too much of this medicine contact a poison control center or emergency room at once. NOTE: This medicine is only for you. Do not share this medicine with others. What if I miss a dose? It is important not to miss your dose. Call your doctor or health care professional if you are unable to keep an appointment. What may interact with this medicine? This medicine may interact with the following medications: -amiodarone -amphotericin B -azathioprine -certain antiviral medicines for HIV or AIDS such as protease inhibitors (e.g., indinavir, ritonavir) and zidovudine -certain blood pressure medications such as benazepril, captopril, enalapril, fosinopril, lisinopril, moexipril, monopril, perindopril, quinapril, ramipril, trandolapril -certain cancer medications such as anthracyclines (e.g., daunorubicin, doxorubicin), busulfan, cytarabine, paclitaxel, pentostatin, tamoxifen, trastuzumab -certain diuretics such as chlorothiazide, chlorthalidone, hydrochlorothiazide, indapamide, metolazone -certain medicines that treat or prevent blood clots like warfarin -certain muscle relaxants such as succinylcholine -cyclosporine -etanercept -indomethacin -medicines to increase blood counts like filgrastim, pegfilgrastim, sargramostim -medicines used as general anesthesia -metronidazole -natalizumab This list may not describe all possible interactions. Give your health care provider a list of all the medicines, herbs, non-prescription drugs, or dietary supplements you use. Also tell them if you smoke, drink alcohol,  or use illegal drugs. Some items may interact with your medicine. What should I watch  for while using this medicine? Visit your doctor for checks on your progress. This drug may make you feel generally unwell. This is not uncommon, as chemotherapy can affect healthy cells as well as cancer cells. Report any side effects. Continue your course of treatment even though you feel ill unless your doctor tells you to stop. Drink water or other fluids as directed. Urinate often, even at night. In some cases, you may be given additional medicines to help with side effects. Follow all directions for their use. Call your doctor or health care professional for advice if you get a fever, chills or sore throat, or other symptoms of a cold or flu. Do not treat yourself. This drug decreases your body's ability to fight infections. Try to avoid being around people who are sick. This medicine may increase your risk to bruise or bleed. Call your doctor or health care professional if you notice any unusual bleeding. Be careful brushing and flossing your teeth or using a toothpick because you may get an infection or bleed more easily. If you have any dental work done, tell your dentist you are receiving this medicine. You may get drowsy or dizzy. Do not drive, use machinery, or do anything that needs mental alertness until you know how this medicine affects you. Do not become pregnant while taking this medicine or for 1 year after stopping it. Women should inform their doctor if they wish to become pregnant or think they might be pregnant. Men should not father a child while taking this medicine and for 4 months after stopping it. There is a potential for serious side effects to an unborn child. Talk to your health care professional or pharmacist for more information. Do not breast-feed an infant while taking this medicine. This medicine may interfere with the ability to have a child. This medicine has caused ovarian failure  in some women. This medicine has caused reduced sperm counts in some men. You should talk with your doctor or health care professional if you are concerned about your fertility. If you are going to have surgery, tell your doctor or health care professional that you have taken this medicine. What side effects may I notice from receiving this medicine? Side effects that you should report to your doctor or health care professional as soon as possible: -allergic reactions like skin rash, itching or hives, swelling of the face, lips, or tongue -low blood counts - this medicine may decrease the number of white blood cells, red blood cells and platelets. You may be at increased risk for infections and bleeding. -signs of infection - fever or chills, cough, sore throat, pain or difficulty passing urine -signs of decreased platelets or bleeding - bruising, pinpoint red spots on the skin, black, tarry stools, blood in the urine -signs of decreased red blood cells - unusually weak or tired, fainting spells, lightheadedness -breathing problems -dark urine -dizziness -palpitations -swelling of the ankles, feet, hands -trouble passing urine or change in the amount of urine -weight gain -yellowing of the eyes or skin Side effects that usually do not require medical attention (report to your doctor or health care professional if they continue or are bothersome): -changes in nail or skin color -hair loss -missed menstrual periods -mouth sores -nausea, vomiting This list may not describe all possible side effects. Call your doctor for medical advice about side effects. You may report side effects to FDA at 1-800-FDA-1088. Where should I keep my medicine? This drug is given in a hospital  or clinic and will not be stored at home. NOTE: This sheet is a summary. It may not cover all possible information. If you have questions about this medicine, talk to your doctor, pharmacist, or health care provider.  2018  Elsevier/Gold Standard (2012-07-20 16:22:58)  Methotrexate injection What is this medicine? METHOTREXATE (METH oh TREX ate) is a chemotherapy drug used to treat cancer including breast cancer, leukemia, and lymphoma. This medicine can also be used to treat psoriasis and certain kinds of arthritis. This medicine may be used for other purposes; ask your health care provider or pharmacist if you have questions. What should I tell my health care provider before I take this medicine? They need to know if you have any of these conditions: -fluid in the stomach area or lungs -if you often drink alcohol -infection or immune system problems -kidney disease -liver disease -low blood counts, like low white cell, platelet, or red cell counts -lung disease -radiation therapy -stomach ulcers -ulcerative colitis -an unusual or allergic reaction to methotrexate, other medicines, foods, dyes, or preservatives -pregnant or trying to get pregnant -breast-feeding How should I use this medicine? This medicine is for infusion into a vein or for injection into muscle or into the spinal fluid (whichever applies). It is usually given by a health care professional in a hospital or clinic setting. In rare cases, you might get this medicine at home. You will be taught how to give this medicine. Use exactly as directed. Take your medicine at regular intervals. Do not take your medicine more often than directed. If this medicine is used for arthritis or psoriasis, it should be taken weekly, NOT daily. It is important that you put your used needles and syringes in a special sharps container. Do not put them in a trash can. If you do not have a sharps container, call your pharmacist or healthcare provider to get one. Talk to your pediatrician regarding the use of this medicine in children. While this drug may be prescribed for children as young as 2 years for selected conditions, precautions do apply. Overdosage: If you  think you have taken too much of this medicine contact a poison control center or emergency room at once. NOTE: This medicine is only for you. Do not share this medicine with others. What if I miss a dose? It is important not to miss your dose. Call your doctor or health care professional if you are unable to keep an appointment. If you give yourself the medicine and you miss a dose, talk with your doctor or health care professional. Do not take double or extra doses. What may interact with this medicine? This medicine may interact with the following medications: -acitretin -aspirin or aspirin-like medicines including salicylates -azathioprine -certain antibiotics like chloramphenicol, penicillin, tetracycline -certain medicines for stomach problems like esomeprazole, omeprazole, pantoprazole -cyclosporine -gold -hydroxychloroquine -live virus vaccines -mercaptopurine -NSAIDs, medicines for pain and inflammation, like ibuprofen or naproxen -other cytotoxic agents -penicillamine -phenylbutazone -phenytoin -probenacid -retinoids such as isotretinoin and tretinoin -steroid medicines like prednisone or cortisone -sulfonamides like sulfasalazine and trimethoprim/sulfamethoxazole -theophylline This list may not describe all possible interactions. Give your health care provider a list of all the medicines, herbs, non-prescription drugs, or dietary supplements you use. Also tell them if you smoke, drink alcohol, or use illegal drugs. Some items may interact with your medicine. What should I watch for while using this medicine? Avoid alcoholic drinks. In some cases, you may be given additional medicines to help with side effects. Follow  all directions for their use. This medicine can make you more sensitive to the sun. Keep out of the sun. If you cannot avoid being in the sun, wear protective clothing and use sunscreen. Do not use sun lamps or tanning beds/booths. You may get drowsy or dizzy.  Do not drive, use machinery, or do anything that needs mental alertness until you know how this medicine affects you. Do not stand or sit up quickly, especially if you are an older patient. This reduces the risk of dizzy or fainting spells. You may need blood work done while you are taking this medicine. Call your doctor or health care professional for advice if you get a fever, chills or sore throat, or other symptoms of a cold or flu. Do not treat yourself. This drug decreases your body's ability to fight infections. Try to avoid being around people who are sick. This medicine may increase your risk to bruise or bleed. Call your doctor or health care professional if you notice any unusual bleeding. Check with your doctor or health care professional if you get an attack of severe diarrhea, nausea and vomiting, or if you sweat a lot. The loss of too much body fluid can make it dangerous for you to take this medicine. Talk to your doctor about your risk of cancer. You may be more at risk for certain types of cancers if you take this medicine. Both men and women must use effective birth control with this medicine. Do not become pregnant while taking this medicine or until at least 1 normal menstrual cycle has occurred after stopping it. Women should inform their doctor if they wish to become pregnant or think they might be pregnant. Men should not father a child while taking this medicine and for 3 months after stopping it. There is a potential for serious side effects to an unborn child. Talk to your health care professional or pharmacist for more information. Do not breast-feed an infant while taking this medicine. What side effects may I notice from receiving this medicine? Side effects that you should report to your doctor or health care professional as soon as possible: -allergic reactions like skin rash, itching or hives, swelling of the face, lips, or tongue -back pain -breathing problems or  shortness of breath -confusion -diarrhea -dry, nonproductive cough -low blood counts - this medicine may decrease the number of white blood cells, red blood cells and platelets. You may be at increased risk of infections and bleeding -mouth sores -redness, blistering, peeling or loosening of the skin, including inside the mouth -seizures -severe headaches -signs of infection - fever or chills, cough, sore throat, pain or difficulty passing urine -signs and symptoms of bleeding such as bloody or black, tarry stools; red or dark-brown urine; spitting up blood or brown material that looks like coffee grounds; red spots on the skin; unusual bruising or bleeding from the eye, gums, or nose -signs and symptoms of kidney injury like trouble passing urine or change in the amount of urine -signs and symptoms of liver injury like dark yellow or brown urine; general ill feeling or flu-like symptoms; light-colored stools; loss of appetite; nausea; right upper belly pain; unusually weak or tired; yellowing of the eyes or skin -stiff neck -vomiting Side effects that usually do not require medical attention (report to your doctor or health care professional if they continue or are bothersome): -dizziness -hair loss -headache -stomach pain -upset stomach This list may not describe all possible side effects. Call your  doctor for medical advice about side effects. You may report side effects to FDA at 1-800-FDA-1088. Where should I keep my medicine? If you are using this medicine at home, you will be instructed on how to store this medicine. Throw away any unused medicine after the expiration date on the label. NOTE: This sheet is a summary. It may not cover all possible information. If you have questions about this medicine, talk to your doctor, pharmacist, or health care provider.  2018 Elsevier/Gold Standard (2014-12-25 12:36:41)  Fluorouracil, 5-FU injection What is this medicine? FLUOROURACIL, 5-FU  (flure oh YOOR a sil) is a chemotherapy drug. It slows the growth of cancer cells. This medicine is used to treat many types of cancer like breast cancer, colon or rectal cancer, pancreatic cancer, and stomach cancer. This medicine may be used for other purposes; ask your health care provider or pharmacist if you have questions. COMMON BRAND NAME(S): Adrucil What should I tell my health care provider before I take this medicine? They need to know if you have any of these conditions: -blood disorders -dihydropyrimidine dehydrogenase (DPD) deficiency -infection (especially a virus infection such as chickenpox, cold sores, or herpes) -kidney disease -liver disease -malnourished, poor nutrition -recent or ongoing radiation therapy -an unusual or allergic reaction to fluorouracil, other chemotherapy, other medicines, foods, dyes, or preservatives -pregnant or trying to get pregnant -breast-feeding How should I use this medicine? This drug is given as an infusion or injection into a vein. It is administered in a hospital or clinic by a specially trained health care professional. Talk to your pediatrician regarding the use of this medicine in children. Special care may be needed. Overdosage: If you think you have taken too much of this medicine contact a poison control center or emergency room at once. NOTE: This medicine is only for you. Do not share this medicine with others. What if I miss a dose? It is important not to miss your dose. Call your doctor or health care professional if you are unable to keep an appointment. What may interact with this medicine? -allopurinol -cimetidine -dapsone -digoxin -hydroxyurea -leucovorin -levamisole -medicines for seizures like ethotoin, fosphenytoin, phenytoin -medicines to increase blood counts like filgrastim, pegfilgrastim, sargramostim -medicines that treat or prevent blood clots like warfarin, enoxaparin, and  dalteparin -methotrexate -metronidazole -pyrimethamine -some other chemotherapy drugs like busulfan, cisplatin, estramustine, vinblastine -trimethoprim -trimetrexate -vaccines Talk to your doctor or health care professional before taking any of these medicines: -acetaminophen -aspirin -ibuprofen -ketoprofen -naproxen This list may not describe all possible interactions. Give your health care provider a list of all the medicines, herbs, non-prescription drugs, or dietary supplements you use. Also tell them if you smoke, drink alcohol, or use illegal drugs. Some items may interact with your medicine. What should I watch for while using this medicine? Visit your doctor for checks on your progress. This drug may make you feel generally unwell. This is not uncommon, as chemotherapy can affect healthy cells as well as cancer cells. Report any side effects. Continue your course of treatment even though you feel ill unless your doctor tells you to stop. In some cases, you may be given additional medicines to help with side effects. Follow all directions for their use. Call your doctor or health care professional for advice if you get a fever, chills or sore throat, or other symptoms of a cold or flu. Do not treat yourself. This drug decreases your body's ability to fight infections. Try to avoid being around people who are  sick. This medicine may increase your risk to bruise or bleed. Call your doctor or health care professional if you notice any unusual bleeding. Be careful brushing and flossing your teeth or using a toothpick because you may get an infection or bleed more easily. If you have any dental work done, tell your dentist you are receiving this medicine. Avoid taking products that contain aspirin, acetaminophen, ibuprofen, naproxen, or ketoprofen unless instructed by your doctor. These medicines may hide a fever. Do not become pregnant while taking this medicine. Women should inform their  doctor if they wish to become pregnant or think they might be pregnant. There is a potential for serious side effects to an unborn child. Talk to your health care professional or pharmacist for more information. Do not breast-feed an infant while taking this medicine. Men should inform their doctor if they wish to father a child. This medicine may lower sperm counts. Do not treat diarrhea with over the counter products. Contact your doctor if you have diarrhea that lasts more than 2 days or if it is severe and watery. This medicine can make you more sensitive to the sun. Keep out of the sun. If you cannot avoid being in the sun, wear protective clothing and use sunscreen. Do not use sun lamps or tanning beds/booths. What side effects may I notice from receiving this medicine? Side effects that you should report to your doctor or health care professional as soon as possible: -allergic reactions like skin rash, itching or hives, swelling of the face, lips, or tongue -low blood counts - this medicine may decrease the number of white blood cells, red blood cells and platelets. You may be at increased risk for infections and bleeding. -signs of infection - fever or chills, cough, sore throat, pain or difficulty passing urine -signs of decreased platelets or bleeding - bruising, pinpoint red spots on the skin, black, tarry stools, blood in the urine -signs of decreased red blood cells - unusually weak or tired, fainting spells, lightheadedness -breathing problems -changes in vision -chest pain -mouth sores -nausea and vomiting -pain, swelling, redness at site where injected -pain, tingling, numbness in the hands or feet -redness, swelling, or sores on hands or feet -stomach pain -unusual bleeding Side effects that usually do not require medical attention (report to your doctor or health care professional if they continue or are bothersome): -changes in finger or toe nails -diarrhea -dry or itchy  skin -hair loss -headache -loss of appetite -sensitivity of eyes to the light -stomach upset -unusually teary eyes This list may not describe all possible side effects. Call your doctor for medical advice about side effects. You may report side effects to FDA at 1-800-FDA-1088. Where should I keep my medicine? This drug is given in a hospital or clinic and will not be stored at home. NOTE: This sheet is a summary. It may not cover all possible information. If you have questions about this medicine, talk to your doctor, pharmacist, or health care provider.  2018 Elsevier/Gold Standard (2008-01-09 13:53:16)    Blood Transfusion, Care After This sheet gives you information about how to care for yourself after your procedure. Your doctor may also give you more specific instructions. If you have problems or questions, contact your doctor. Follow these instructions at home:  Take over-the-counter and prescription medicines only as told by your doctor.  Go back to your normal activities as told by your doctor.  Follow instructions from your doctor about how to take care of  the area where an IV tube was put into your vein (insertion site). Make sure you: ? Wash your hands with soap and water before you change your bandage (dressing). If there is no soap and water, use hand sanitizer. ? Change your bandage as told by your doctor.  Check your IV insertion site every day for signs of infection. Check for: ? More redness, swelling, or pain. ? More fluid or blood. ? Warmth. ? Pus or a bad smell. Contact a doctor if:  You have more redness, swelling, or pain around the IV insertion site..  You have more fluid or blood coming from the IV insertion site.  Your IV insertion site feels warm to the touch.  You have pus or a bad smell coming from the IV insertion site.  Your pee (urine) turns pink, red, or brown.  You feel weak after doing your normal activities. Get help right away  if:  You have signs of a serious allergic or body defense (immune) system reaction, including: ? Itchiness. ? Hives. ? Trouble breathing. ? Anxiety. ? Pain in your chest or lower back. ? Fever, flushing, and chills. ? Fast pulse. ? Rash. ? Watery poop (diarrhea). ? Throwing up (vomiting). ? Dark pee. ? Serious headache. ? Dizziness. ? Stiff neck. ? Yellow color in your face or the white parts of your eyes (jaundice). Summary  After a blood transfusion, return to your normal activities as told by your doctor.  Every day, check for signs of infection where the IV tube was put into your vein.  Some signs of infection are warm skin, more redness and pain, more fluid or blood, and pus or a bad smell where the needle went in.  Contact your doctor if you feel weak or have any unusual symptoms. This information is not intended to replace advice given to you by your health care provider. Make sure you discuss any questions you have with your health care provider. Document Released: 09/26/2014 Document Revised: 04/29/2016 Document Reviewed: 04/29/2016 Elsevier Interactive Patient Education  2017 [employer].

## 2017-11-23 NOTE — Telephone Encounter (Signed)
Scheduled appt per 3/7 los - Patient to et an updated schedule in the treatment area.

## 2017-11-23 NOTE — Telephone Encounter (Signed)
Dr. Benay Spice informed of Calcium level 13.6 and AST 248. Pt seeing MD today.

## 2017-11-23 NOTE — Telephone Encounter (Signed)
Called [facility] Ad[facility]: they should be able to accept pt despite her [facility] address. Records faxed electronically. Requested they assess her equipment needs at home.

## 2017-11-24 DIAGNOSIS — C7951 Secondary malignant neoplasm of bone: Secondary | ICD-10-CM | POA: Diagnosis not present

## 2017-11-24 DIAGNOSIS — C50919 Malignant neoplasm of unspecified site of unspecified female breast: Secondary | ICD-10-CM | POA: Diagnosis not present

## 2017-11-24 DIAGNOSIS — Z8639 Personal history of other endocrine, nutritional and metabolic disease: Secondary | ICD-10-CM | POA: Diagnosis not present

## 2017-11-27 DIAGNOSIS — C50919 Malignant neoplasm of unspecified site of unspecified female breast: Secondary | ICD-10-CM | POA: Diagnosis not present

## 2017-11-27 DIAGNOSIS — Z8639 Personal history of other endocrine, nutritional and metabolic disease: Secondary | ICD-10-CM | POA: Diagnosis not present

## 2017-11-27 DIAGNOSIS — C7951 Secondary malignant neoplasm of bone: Secondary | ICD-10-CM | POA: Diagnosis not present

## 2017-11-28 DIAGNOSIS — C7951 Secondary malignant neoplasm of bone: Secondary | ICD-10-CM | POA: Diagnosis not present

## 2017-11-28 DIAGNOSIS — Z8639 Personal history of other endocrine, nutritional and metabolic disease: Secondary | ICD-10-CM | POA: Diagnosis not present

## 2017-11-28 DIAGNOSIS — C50919 Malignant neoplasm of unspecified site of unspecified female breast: Secondary | ICD-10-CM | POA: Diagnosis not present

## 2017-11-29 ENCOUNTER — Other Ambulatory Visit: Payer: Self-pay | Admitting: Emergency Medicine

## 2017-11-29 DIAGNOSIS — C50919 Malignant neoplasm of unspecified site of unspecified female breast: Secondary | ICD-10-CM

## 2017-11-29 DIAGNOSIS — C7951 Secondary malignant neoplasm of bone: Principal | ICD-10-CM

## 2017-11-29 MED ORDER — POTASSIUM CHLORIDE CRYS ER 20 MEQ PO TBCR
20.0000 meq | EXTENDED_RELEASE_TABLET | Freq: Two times a day (BID) | ORAL | 1 refills | Status: AC
Start: 2017-11-29 — End: 2017-12-29

## 2017-11-30 DIAGNOSIS — C50919 Malignant neoplasm of unspecified site of unspecified female breast: Secondary | ICD-10-CM | POA: Diagnosis not present

## 2017-11-30 DIAGNOSIS — C7951 Secondary malignant neoplasm of bone: Secondary | ICD-10-CM | POA: Diagnosis not present

## 2017-11-30 DIAGNOSIS — Z8639 Personal history of other endocrine, nutritional and metabolic disease: Secondary | ICD-10-CM | POA: Diagnosis not present

## 2017-12-01 DIAGNOSIS — C7951 Secondary malignant neoplasm of bone: Secondary | ICD-10-CM | POA: Diagnosis not present

## 2017-12-01 DIAGNOSIS — Z8639 Personal history of other endocrine, nutritional and metabolic disease: Secondary | ICD-10-CM | POA: Diagnosis not present

## 2017-12-01 DIAGNOSIS — C50919 Malignant neoplasm of unspecified site of unspecified female breast: Secondary | ICD-10-CM | POA: Diagnosis not present

## 2017-12-04 ENCOUNTER — Other Ambulatory Visit: Payer: Medicare Other

## 2017-12-04 ENCOUNTER — Telehealth: Payer: Self-pay | Admitting: Nurse Practitioner

## 2017-12-04 ENCOUNTER — Encounter: Payer: Self-pay | Admitting: Oncology

## 2017-12-04 ENCOUNTER — Ambulatory Visit: Payer: Medicare Other | Admitting: Nurse Practitioner

## 2017-12-04 ENCOUNTER — Telehealth: Payer: Self-pay

## 2017-12-04 NOTE — Telephone Encounter (Signed)
Scheduled appt per 3/18 sch msg - spoke with patient regarding appt.

## 2017-12-04 NOTE — Telephone Encounter (Signed)
Patient's husband called approximately 10:20 this am to say patient will not make it in today.  Hospice nurse told her to take a laxative and stay at home today. Husband said she will be taking Sobritol to help bowels move.  He reports she is feeling better, has decreased her amount of morphine, no vomiting, no jerking/shaking, and able to stand/walk with help.  Note sent to have appts rescheduled.

## 2017-12-05 DIAGNOSIS — C7951 Secondary malignant neoplasm of bone: Secondary | ICD-10-CM | POA: Diagnosis not present

## 2017-12-05 DIAGNOSIS — Z8639 Personal history of other endocrine, nutritional and metabolic disease: Secondary | ICD-10-CM | POA: Diagnosis not present

## 2017-12-05 DIAGNOSIS — C50919 Malignant neoplasm of unspecified site of unspecified female breast: Secondary | ICD-10-CM | POA: Diagnosis not present

## 2017-12-06 ENCOUNTER — Encounter: Payer: Self-pay | Admitting: Oncology

## 2017-12-06 DIAGNOSIS — C50919 Malignant neoplasm of unspecified site of unspecified female breast: Secondary | ICD-10-CM | POA: Diagnosis not present

## 2017-12-06 DIAGNOSIS — Z8639 Personal history of other endocrine, nutritional and metabolic disease: Secondary | ICD-10-CM | POA: Diagnosis not present

## 2017-12-06 DIAGNOSIS — C7951 Secondary malignant neoplasm of bone: Secondary | ICD-10-CM | POA: Diagnosis not present

## 2017-12-07 DIAGNOSIS — C50919 Malignant neoplasm of unspecified site of unspecified female breast: Secondary | ICD-10-CM | POA: Diagnosis not present

## 2017-12-07 DIAGNOSIS — Z8639 Personal history of other endocrine, nutritional and metabolic disease: Secondary | ICD-10-CM | POA: Diagnosis not present

## 2017-12-07 DIAGNOSIS — C7951 Secondary malignant neoplasm of bone: Secondary | ICD-10-CM | POA: Diagnosis not present

## 2017-12-08 ENCOUNTER — Other Ambulatory Visit: Payer: Medicare Other

## 2017-12-08 ENCOUNTER — Ambulatory Visit: Payer: Medicare Other | Admitting: Nurse Practitioner

## 2017-12-08 DIAGNOSIS — Z8639 Personal history of other endocrine, nutritional and metabolic disease: Secondary | ICD-10-CM | POA: Diagnosis not present

## 2017-12-08 DIAGNOSIS — C7951 Secondary malignant neoplasm of bone: Secondary | ICD-10-CM | POA: Diagnosis not present

## 2017-12-08 DIAGNOSIS — C50919 Malignant neoplasm of unspecified site of unspecified female breast: Secondary | ICD-10-CM | POA: Diagnosis not present

## 2017-12-11 DIAGNOSIS — C7951 Secondary malignant neoplasm of bone: Secondary | ICD-10-CM | POA: Diagnosis not present

## 2017-12-11 DIAGNOSIS — C50919 Malignant neoplasm of unspecified site of unspecified female breast: Secondary | ICD-10-CM | POA: Diagnosis not present

## 2017-12-11 DIAGNOSIS — Z8639 Personal history of other endocrine, nutritional and metabolic disease: Secondary | ICD-10-CM | POA: Diagnosis not present

## 2017-12-12 DIAGNOSIS — C50919 Malignant neoplasm of unspecified site of unspecified female breast: Secondary | ICD-10-CM | POA: Diagnosis not present

## 2017-12-12 DIAGNOSIS — C7951 Secondary malignant neoplasm of bone: Secondary | ICD-10-CM | POA: Diagnosis not present

## 2017-12-12 DIAGNOSIS — Z8639 Personal history of other endocrine, nutritional and metabolic disease: Secondary | ICD-10-CM | POA: Diagnosis not present

## 2017-12-14 DIAGNOSIS — C50919 Malignant neoplasm of unspecified site of unspecified female breast: Secondary | ICD-10-CM | POA: Diagnosis not present

## 2017-12-14 DIAGNOSIS — Z8639 Personal history of other endocrine, nutritional and metabolic disease: Secondary | ICD-10-CM | POA: Diagnosis not present

## 2017-12-14 DIAGNOSIS — C7951 Secondary malignant neoplasm of bone: Secondary | ICD-10-CM | POA: Diagnosis not present

## 2017-12-15 DIAGNOSIS — Z8639 Personal history of other endocrine, nutritional and metabolic disease: Secondary | ICD-10-CM | POA: Diagnosis not present

## 2017-12-15 DIAGNOSIS — C50919 Malignant neoplasm of unspecified site of unspecified female breast: Secondary | ICD-10-CM | POA: Diagnosis not present

## 2017-12-15 DIAGNOSIS — C7951 Secondary malignant neoplasm of bone: Secondary | ICD-10-CM | POA: Diagnosis not present

## 2017-12-18 DIAGNOSIS — C50919 Malignant neoplasm of unspecified site of unspecified female breast: Secondary | ICD-10-CM | POA: Diagnosis not present

## 2017-12-18 DIAGNOSIS — C7951 Secondary malignant neoplasm of bone: Secondary | ICD-10-CM | POA: Diagnosis not present

## 2017-12-18 DIAGNOSIS — Z8639 Personal history of other endocrine, nutritional and metabolic disease: Secondary | ICD-10-CM | POA: Diagnosis not present

## 2017-12-19 DIAGNOSIS — C7951 Secondary malignant neoplasm of bone: Secondary | ICD-10-CM | POA: Diagnosis not present

## 2017-12-19 DIAGNOSIS — C50919 Malignant neoplasm of unspecified site of unspecified female breast: Secondary | ICD-10-CM | POA: Diagnosis not present

## 2017-12-19 DIAGNOSIS — Z8639 Personal history of other endocrine, nutritional and metabolic disease: Secondary | ICD-10-CM | POA: Diagnosis not present

## 2017-12-21 DIAGNOSIS — C7951 Secondary malignant neoplasm of bone: Secondary | ICD-10-CM | POA: Diagnosis not present

## 2017-12-21 DIAGNOSIS — C50919 Malignant neoplasm of unspecified site of unspecified female breast: Secondary | ICD-10-CM | POA: Diagnosis not present

## 2017-12-21 DIAGNOSIS — Z8639 Personal history of other endocrine, nutritional and metabolic disease: Secondary | ICD-10-CM | POA: Diagnosis not present

## 2017-12-22 DIAGNOSIS — Z8639 Personal history of other endocrine, nutritional and metabolic disease: Secondary | ICD-10-CM | POA: Diagnosis not present

## 2017-12-22 DIAGNOSIS — C7951 Secondary malignant neoplasm of bone: Secondary | ICD-10-CM | POA: Diagnosis not present

## 2017-12-22 DIAGNOSIS — C50919 Malignant neoplasm of unspecified site of unspecified female breast: Secondary | ICD-10-CM | POA: Diagnosis not present

## 2017-12-25 DIAGNOSIS — C7951 Secondary malignant neoplasm of bone: Secondary | ICD-10-CM | POA: Diagnosis not present

## 2017-12-25 DIAGNOSIS — Z8639 Personal history of other endocrine, nutritional and metabolic disease: Secondary | ICD-10-CM | POA: Diagnosis not present

## 2017-12-25 DIAGNOSIS — C50919 Malignant neoplasm of unspecified site of unspecified female breast: Secondary | ICD-10-CM | POA: Diagnosis not present

## 2017-12-26 ENCOUNTER — Encounter: Payer: Self-pay | Admitting: Oncology

## 2017-12-26 ENCOUNTER — Telehealth: Payer: Self-pay

## 2017-12-26 DIAGNOSIS — C7951 Secondary malignant neoplasm of bone: Secondary | ICD-10-CM | POA: Diagnosis not present

## 2017-12-26 DIAGNOSIS — C50919 Malignant neoplasm of unspecified site of unspecified female breast: Secondary | ICD-10-CM | POA: Diagnosis not present

## 2017-12-26 DIAGNOSIS — Z8639 Personal history of other endocrine, nutritional and metabolic disease: Secondary | ICD-10-CM | POA: Diagnosis not present

## 2017-12-26 NOTE — Telephone Encounter (Signed)
Received MyChart message and call from husband and Hospice RN. Hospice RN and husband questioned if Narcan can be used to reverse suspected effects of opiods. In response to recent MyChart message, no Narcan recommended due to possibility of pain crisis, pt to try hydrocodone for pain instead and methadone recommended by Hospice Dr, ok per Dr. Benay Spice. Conveyed information to husband and Hospice RN. Both voiced understanding.

## 2017-12-28 ENCOUNTER — Telehealth: Payer: Self-pay

## 2017-12-28 DIAGNOSIS — C50919 Malignant neoplasm of unspecified site of unspecified female breast: Secondary | ICD-10-CM | POA: Diagnosis not present

## 2017-12-28 DIAGNOSIS — C7951 Secondary malignant neoplasm of bone: Secondary | ICD-10-CM | POA: Diagnosis not present

## 2017-12-28 DIAGNOSIS — Z8639 Personal history of other endocrine, nutritional and metabolic disease: Secondary | ICD-10-CM | POA: Diagnosis not present

## 2017-12-28 NOTE — Telephone Encounter (Signed)
Returned call to Hospice RN Case Manager, [employer]. Called to ask if pt can be referred to [facility] for pain management and symptom management. LVM.

## 2017-12-29 DIAGNOSIS — C7951 Secondary malignant neoplasm of bone: Secondary | ICD-10-CM | POA: Diagnosis not present

## 2017-12-29 DIAGNOSIS — C50919 Malignant neoplasm of unspecified site of unspecified female breast: Secondary | ICD-10-CM | POA: Diagnosis not present

## 2017-12-29 DIAGNOSIS — Z8639 Personal history of other endocrine, nutritional and metabolic disease: Secondary | ICD-10-CM | POA: Diagnosis not present

## 2017-12-30 DIAGNOSIS — C50919 Malignant neoplasm of unspecified site of unspecified female breast: Secondary | ICD-10-CM | POA: Diagnosis not present

## 2017-12-30 DIAGNOSIS — Z8639 Personal history of other endocrine, nutritional and metabolic disease: Secondary | ICD-10-CM | POA: Diagnosis not present

## 2017-12-30 DIAGNOSIS — C7951 Secondary malignant neoplasm of bone: Secondary | ICD-10-CM | POA: Diagnosis not present

## 2018-01-01 ENCOUNTER — Telehealth: Payer: Self-pay | Admitting: *Deleted

## 2018-01-01 NOTE — Telephone Encounter (Signed)
"  Mistie calling to notify Dr. Benay Spice his patient passed away with [place] [facility] in [place] on Saturday, 01-27-2018 at 10:23 am.  Call me back if any questions or concerns at [phone]."

## 2018-01-17 DEATH — deceased

## 2018-04-12 IMAGING — MR MR ABDOMEN WO/W CM
9 of 18 series · 21 of 48 positions shown · IV contrast (multihance)
Comparison: None.

CLINICAL DATA: 66-year-old female with breast cancer and concern
for liver metastases. Patient was in pain resulting in motion
degradation this exam .

EXAM:
MRI ABDOMEN WITHOUT AND WITH CONTRAST
TECHNIQUE: Multiplanar multisequence MR imaging of the abdomen was performed
both before and after the administration of intravenous contrast.
CONTRAST:  14mL MULTIHANCE GADOBENATE DIMEGLUMINE 529 MG/ML IV SOLN

[Series 3: T2 fat-sat · axial · 5.0mm · 0.74mm/px · z∈[-34,+206]mm · 3 of 49 slices shown]
[im 1/49]
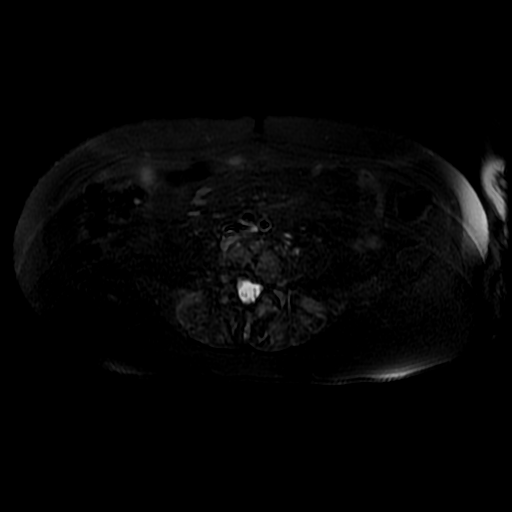
[im 25/49]
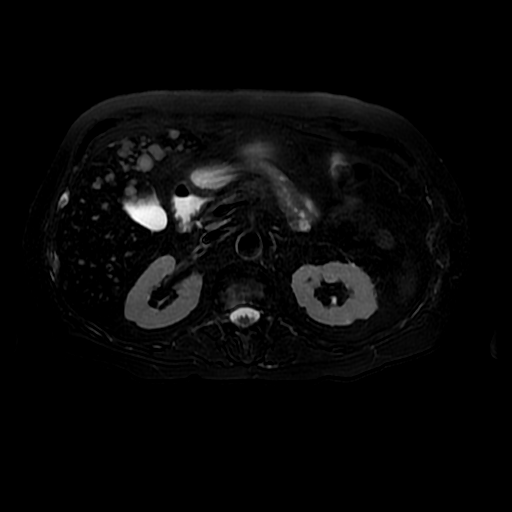
[im 49/49]
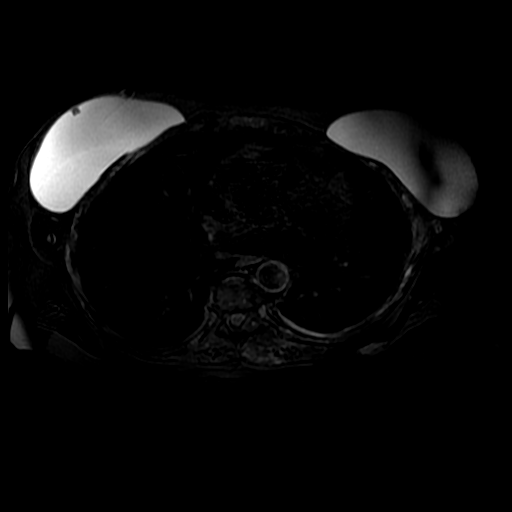

[Series 4: DWI b500 · axial · 6.0mm · 1.48mm/px · z∈[-35,+207]mm · 3 of 64 slices shown]
[im 1/64]
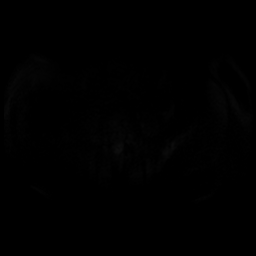
[im 32/64]
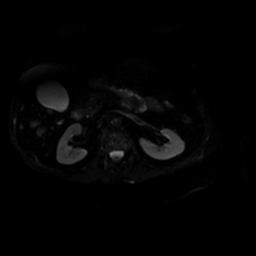
[im 64/64]
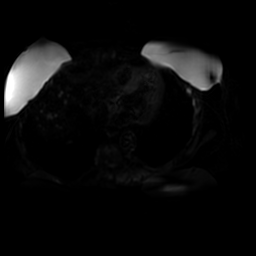

[Series 7: ax dualecho · axial · 5.0mm · 0.78mm/px · z∈[-36,+224]mm · 3 of 106 slices shown]
[im 1/106]
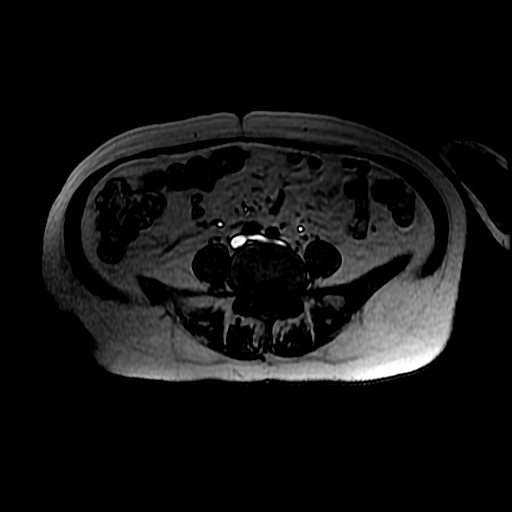
[im 53/106]
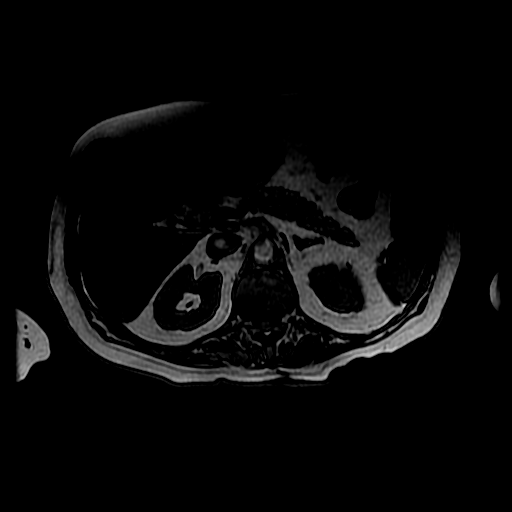
[im 106/106]
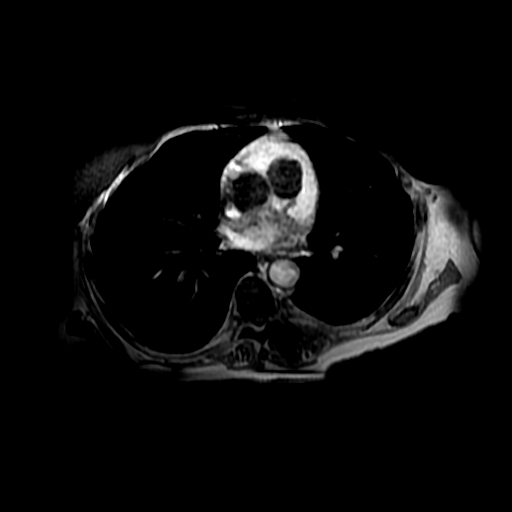

[Series 8: T2 · axial · 5.0mm · 0.78mm/px · z∈[-36,+224]mm · 2 of 53 slices shown (1 of 2)]
[im 1/53]
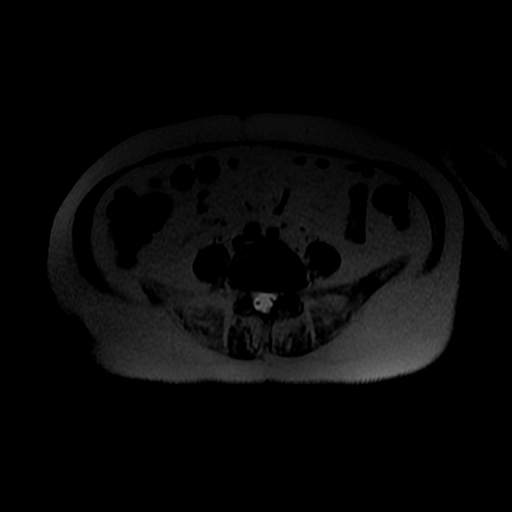
[im 53/53]
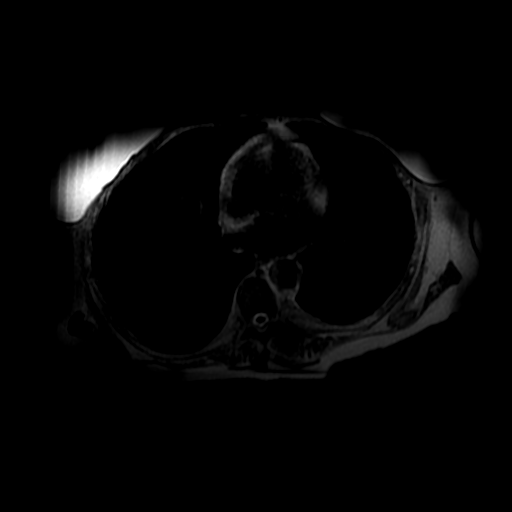

[Series 9: T2 · coronal · 5.0mm · 0.78mm/px · 1 of 41 slices shown (2 of 2)]
[im 1/41]
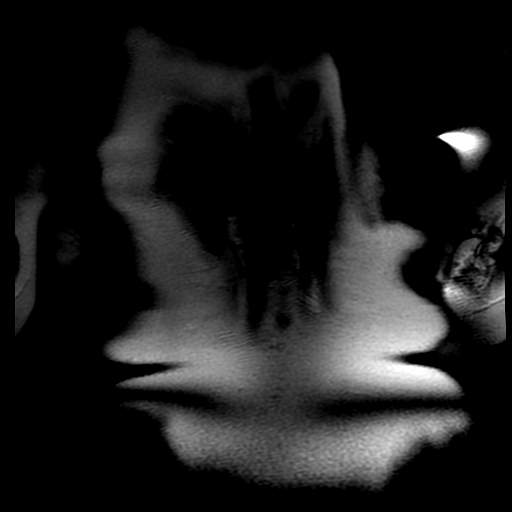

[Series 10: bSSFP · axial · 5.0mm · 0.78mm/px · z∈[-36,+224]mm · 2 of 53 slices shown]
[im 1/53]
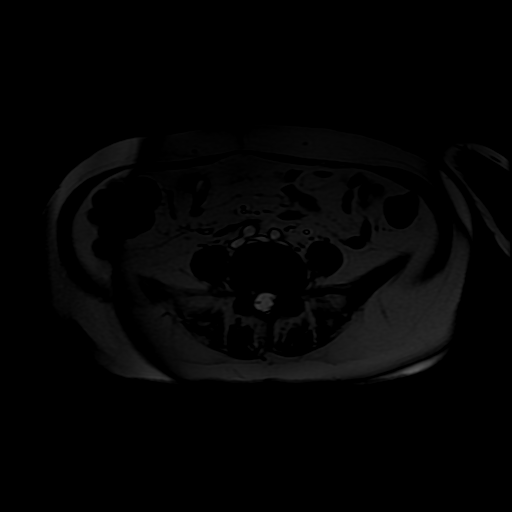
[im 53/53]
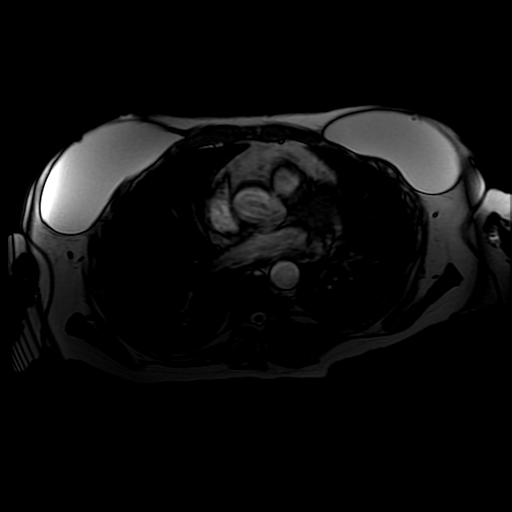

[Series 400: DWI · axial · 6.0mm · 1.48mm/px · 1 of 32 slices shown]
[im 1/32]
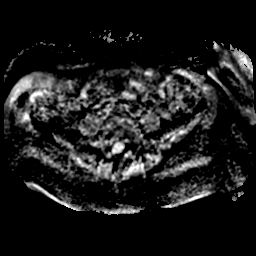

[Series 1100: T1 dynamic · axial · 6.0mm · 0.86mm/px · z∈[-51,+234]mm · 3 of 96 slices shown (1 of 2)]
[im 1/96]
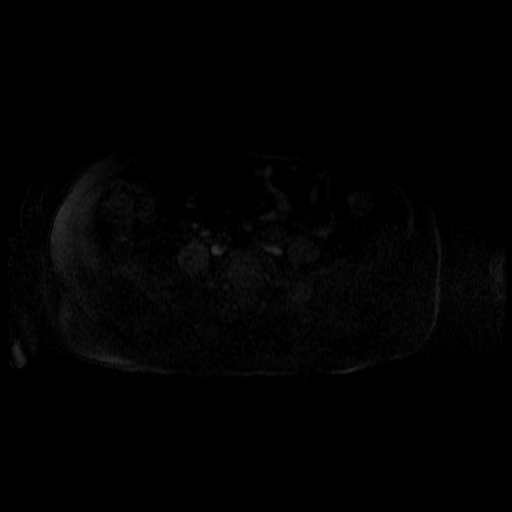
[im 48/96]
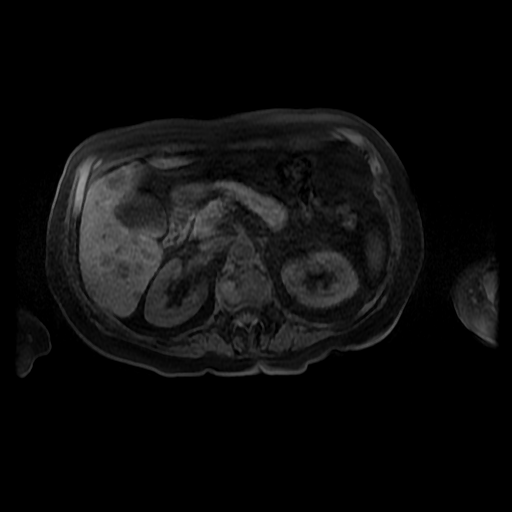
[im 96/96]
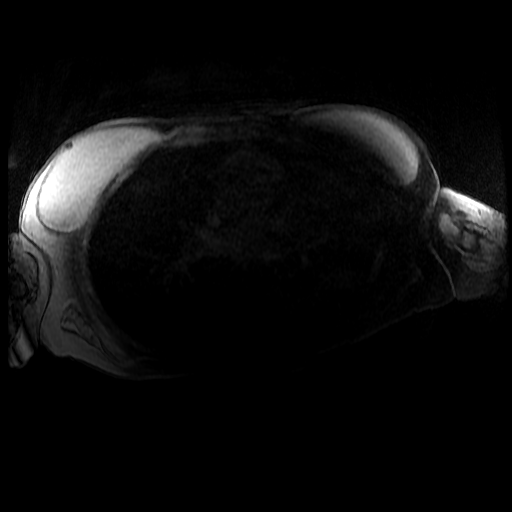

[Series 1101: T1 dynamic · axial · 6.0mm · 0.86mm/px · z∈[-51,+234]mm · 3 of 96 slices shown (2 of 2)]
[im 1/96]
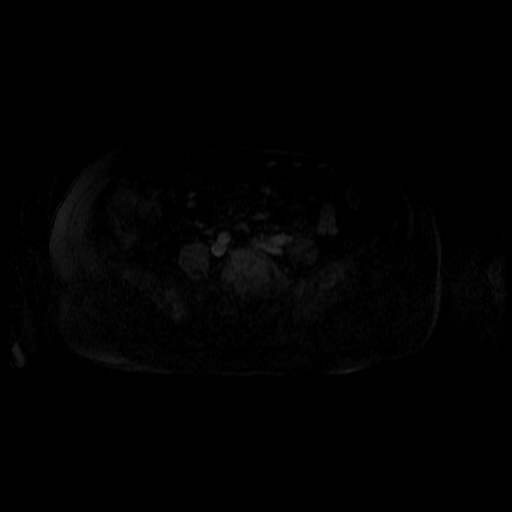
[im 48/96]
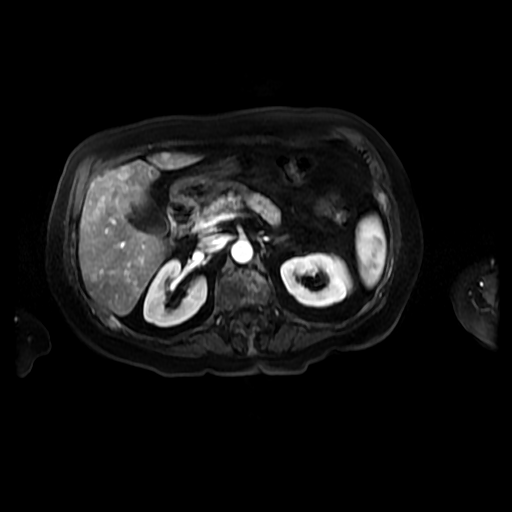
[im 96/96]
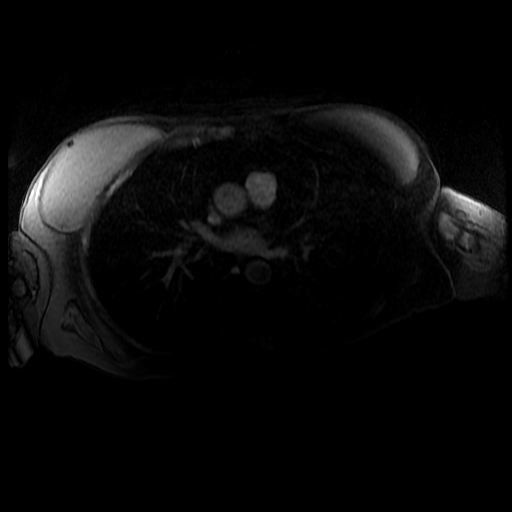

[21 of 48 positions shown; findings below may reference images not displayed]

FINDINGS: Lower chest: Unremarkable.

Hepatobiliary: Innumerable enhancing lesions are seen scattered
throughout both hepatic lobes consistent with metastatic disease.
These are best demonstrated on postcontrast imaging. 15 mm rim
enhancing lesion with central necrosis well demonstrated on image 69
of series 2266. posterior right hepatic dome lesion measures 16 mm
on image 73. 2.2 cm lesion identified inferior aspect of the medial
segment left liver on image 42 of series [DATE]. Right spread liver
metastases range in size from 3-4 mm up to maximum size of
approximately 2 cm. There is no evidence for gallstones, gallbladder
wall thickening, or pericholecystic fluid. No intrahepatic or
extrahepatic biliary dilation.

Pancreas: No focal mass lesion. No dilatation of the main duct. No
intraparenchymal cyst. No peripancreatic edema.

Spleen:  No splenomegaly. No focal mass lesion.

Adrenals/Urinary Tract: No adrenal nodule or mass. 2.4 cm Bosniak II
cyst identified lower pole left kidney. Right kidney unremarkable.

Stomach/Bowel: Stomach is nondistended. No gastric wall thickening.
No evidence of outlet obstruction. Duodenum is normally positioned
as is the ligament of Treitz. No small bowel or colonic dilatation
within the visualized abdomen.

Vascular/Lymphatic: No abdominal aortic aneurysm. No abdominal
lymphadenopathy

Other:  No free fluid.

Musculoskeletal: Diffuse abnormal marrow enhancement within the
visualized skeleton compatible with known bony metastatic disease.
IMPRESSION: 1. Innumerable liver metastases ranging in size from 3-4 mm up to
approximately 2 cm.
2. Widespread bony metastatic involvement.
3. Bosniak II cyst left kidney.
